# Patient Record
Sex: Male | Born: 1940 | Race: White | Hispanic: No | Marital: Married | Smoking: Never smoker
Health system: Southern US, Community
[De-identification: ages and names within clinical notes are randomized; demographics above are authoritative.]

## PROBLEM LIST (undated history)

## (undated) DIAGNOSIS — Z9289 Personal history of other medical treatment: Secondary | ICD-10-CM

## (undated) DIAGNOSIS — K219 Gastro-esophageal reflux disease without esophagitis: Secondary | ICD-10-CM

## (undated) DIAGNOSIS — T8859XA Other complications of anesthesia, initial encounter: Secondary | ICD-10-CM

## (undated) DIAGNOSIS — N189 Chronic kidney disease, unspecified: Secondary | ICD-10-CM

## (undated) DIAGNOSIS — I679 Cerebrovascular disease, unspecified: Secondary | ICD-10-CM

## (undated) DIAGNOSIS — R112 Nausea with vomiting, unspecified: Secondary | ICD-10-CM

## (undated) DIAGNOSIS — I480 Paroxysmal atrial fibrillation: Secondary | ICD-10-CM

## (undated) DIAGNOSIS — E7800 Pure hypercholesterolemia, unspecified: Secondary | ICD-10-CM

## (undated) DIAGNOSIS — I10 Essential (primary) hypertension: Secondary | ICD-10-CM

## (undated) DIAGNOSIS — Q383 Other congenital malformations of tongue: Secondary | ICD-10-CM

## (undated) DIAGNOSIS — Z9889 Other specified postprocedural states: Secondary | ICD-10-CM

## (undated) DIAGNOSIS — D049 Carcinoma in situ of skin, unspecified: Secondary | ICD-10-CM

## (undated) DIAGNOSIS — D649 Anemia, unspecified: Secondary | ICD-10-CM

## (undated) DIAGNOSIS — I739 Peripheral vascular disease, unspecified: Secondary | ICD-10-CM

## (undated) DIAGNOSIS — I2510 Atherosclerotic heart disease of native coronary artery without angina pectoris: Secondary | ICD-10-CM

## (undated) DIAGNOSIS — H8110 Benign paroxysmal vertigo, unspecified ear: Secondary | ICD-10-CM

## (undated) DIAGNOSIS — R918 Other nonspecific abnormal finding of lung field: Secondary | ICD-10-CM

## (undated) DIAGNOSIS — T4145XA Adverse effect of unspecified anesthetic, initial encounter: Secondary | ICD-10-CM

## (undated) DIAGNOSIS — A6000 Herpesviral infection of urogenital system, unspecified: Secondary | ICD-10-CM

## (undated) DIAGNOSIS — C44612 Basal cell carcinoma of skin of right upper limb, including shoulder: Secondary | ICD-10-CM

## (undated) DIAGNOSIS — Z5189 Encounter for other specified aftercare: Secondary | ICD-10-CM

## (undated) HISTORY — DX: Essential (primary) hypertension: I10

## (undated) HISTORY — DX: Basal cell carcinoma of skin of right upper limb, including shoulder: C44.612

## (undated) HISTORY — DX: Gastro-esophageal reflux disease without esophagitis: K21.9

## (undated) HISTORY — DX: Carcinoma in situ of skin, unspecified: D04.9

## (undated) HISTORY — DX: Paroxysmal atrial fibrillation: I48.0

## (undated) HISTORY — DX: Other congenital malformations of tongue: Q38.3

## (undated) HISTORY — DX: Personal history of other medical treatment: Z92.89

## (undated) HISTORY — DX: Encounter for other specified aftercare: Z51.89

## (undated) HISTORY — DX: Anemia, unspecified: D64.9

## (undated) HISTORY — DX: Herpesviral infection of urogenital system, unspecified: A60.00

## (undated) HISTORY — DX: Chronic kidney disease, unspecified: N18.9

## (undated) HISTORY — DX: Peripheral vascular disease, unspecified: I73.9

## (undated) HISTORY — DX: Other nonspecific abnormal finding of lung field: R91.8

## (undated) HISTORY — DX: Pure hypercholesterolemia, unspecified: E78.00

## (undated) HISTORY — DX: Cerebrovascular disease, unspecified: I67.9

## (undated) HISTORY — DX: Atherosclerotic heart disease of native coronary artery without angina pectoris: I25.10

## (undated) HISTORY — DX: Benign paroxysmal vertigo, unspecified ear: H81.10

---

## 2005-01-12 DIAGNOSIS — Z9289 Personal history of other medical treatment: Secondary | ICD-10-CM

## 2005-01-12 HISTORY — DX: Personal history of other medical treatment: Z92.89

## 2005-05-09 ENCOUNTER — Inpatient Hospital Stay (HOSPITAL_COMMUNITY): Admission: EM | Admit: 2005-05-09 | Discharge: 2005-05-18 | Payer: Self-pay | Admitting: Emergency Medicine

## 2005-05-11 ENCOUNTER — Encounter (INDEPENDENT_AMBULATORY_CARE_PROVIDER_SITE_OTHER): Payer: Self-pay | Admitting: *Deleted

## 2005-05-12 HISTORY — PX: CORONARY ARTERY BYPASS GRAFT: SHX141

## 2005-05-20 ENCOUNTER — Inpatient Hospital Stay (HOSPITAL_COMMUNITY): Admission: EM | Admit: 2005-05-20 | Discharge: 2005-05-24 | Payer: Self-pay | Admitting: Emergency Medicine

## 2005-05-21 ENCOUNTER — Encounter (INDEPENDENT_AMBULATORY_CARE_PROVIDER_SITE_OTHER): Payer: Self-pay | Admitting: Cardiology

## 2005-06-11 ENCOUNTER — Encounter: Admission: RE | Admit: 2005-06-11 | Discharge: 2005-06-11 | Payer: Self-pay | Admitting: Cardiothoracic Surgery

## 2005-06-14 HISTORY — PX: CAROTID ENDARTERECTOMY: SUR193

## 2005-06-24 ENCOUNTER — Encounter (HOSPITAL_COMMUNITY): Admission: RE | Admit: 2005-06-24 | Discharge: 2005-09-22 | Payer: Self-pay | Admitting: *Deleted

## 2005-09-18 ENCOUNTER — Emergency Department (HOSPITAL_COMMUNITY): Admission: EM | Admit: 2005-09-18 | Discharge: 2005-09-18 | Payer: Self-pay | Admitting: Emergency Medicine

## 2005-12-20 ENCOUNTER — Ambulatory Visit (HOSPITAL_COMMUNITY): Admission: RE | Admit: 2005-12-20 | Discharge: 2005-12-21 | Payer: Self-pay | Admitting: *Deleted

## 2006-01-10 ENCOUNTER — Inpatient Hospital Stay (HOSPITAL_COMMUNITY): Admission: RE | Admit: 2006-01-10 | Discharge: 2006-01-13 | Payer: Self-pay | Admitting: Vascular Surgery

## 2006-01-11 ENCOUNTER — Encounter (INDEPENDENT_AMBULATORY_CARE_PROVIDER_SITE_OTHER): Payer: Self-pay | Admitting: Specialist

## 2006-01-24 ENCOUNTER — Emergency Department (HOSPITAL_COMMUNITY): Admission: EM | Admit: 2006-01-24 | Discharge: 2006-01-24 | Payer: Self-pay | Admitting: Emergency Medicine

## 2006-04-19 ENCOUNTER — Encounter: Admission: RE | Admit: 2006-04-19 | Discharge: 2006-04-19 | Payer: Self-pay | Admitting: Otolaryngology

## 2006-08-10 ENCOUNTER — Ambulatory Visit: Payer: Self-pay | Admitting: Vascular Surgery

## 2007-01-31 ENCOUNTER — Ambulatory Visit: Payer: Self-pay | Admitting: Vascular Surgery

## 2007-11-13 ENCOUNTER — Ambulatory Visit (HOSPITAL_COMMUNITY): Admission: RE | Admit: 2007-11-13 | Discharge: 2007-11-14 | Payer: Self-pay | Admitting: Nephrology

## 2007-11-13 ENCOUNTER — Encounter (INDEPENDENT_AMBULATORY_CARE_PROVIDER_SITE_OTHER): Payer: Self-pay | Admitting: Nephrology

## 2007-11-28 ENCOUNTER — Ambulatory Visit: Payer: Self-pay | Admitting: Vascular Surgery

## 2008-03-02 ENCOUNTER — Inpatient Hospital Stay (HOSPITAL_COMMUNITY): Admission: EM | Admit: 2008-03-02 | Discharge: 2008-03-08 | Payer: Self-pay | Admitting: Emergency Medicine

## 2008-03-02 ENCOUNTER — Ambulatory Visit: Payer: Self-pay | Admitting: Internal Medicine

## 2008-03-02 ENCOUNTER — Encounter: Payer: Self-pay | Admitting: Pulmonary Disease

## 2008-03-04 ENCOUNTER — Ambulatory Visit: Payer: Self-pay | Admitting: Internal Medicine

## 2008-05-24 ENCOUNTER — Ambulatory Visit: Payer: Self-pay | Admitting: Family Medicine

## 2009-07-30 IMAGING — CR DG CHEST 1V PORT
1 series · 1 of 1 positions shown · non-contrast
Comparison: Portable exam 8578 hours compared to 03/02/2008

CLINICAL DATA: Shortness of breath, fever, atelectasis

PORTABLE CHEST - 1 VIEW

[view not recorded]
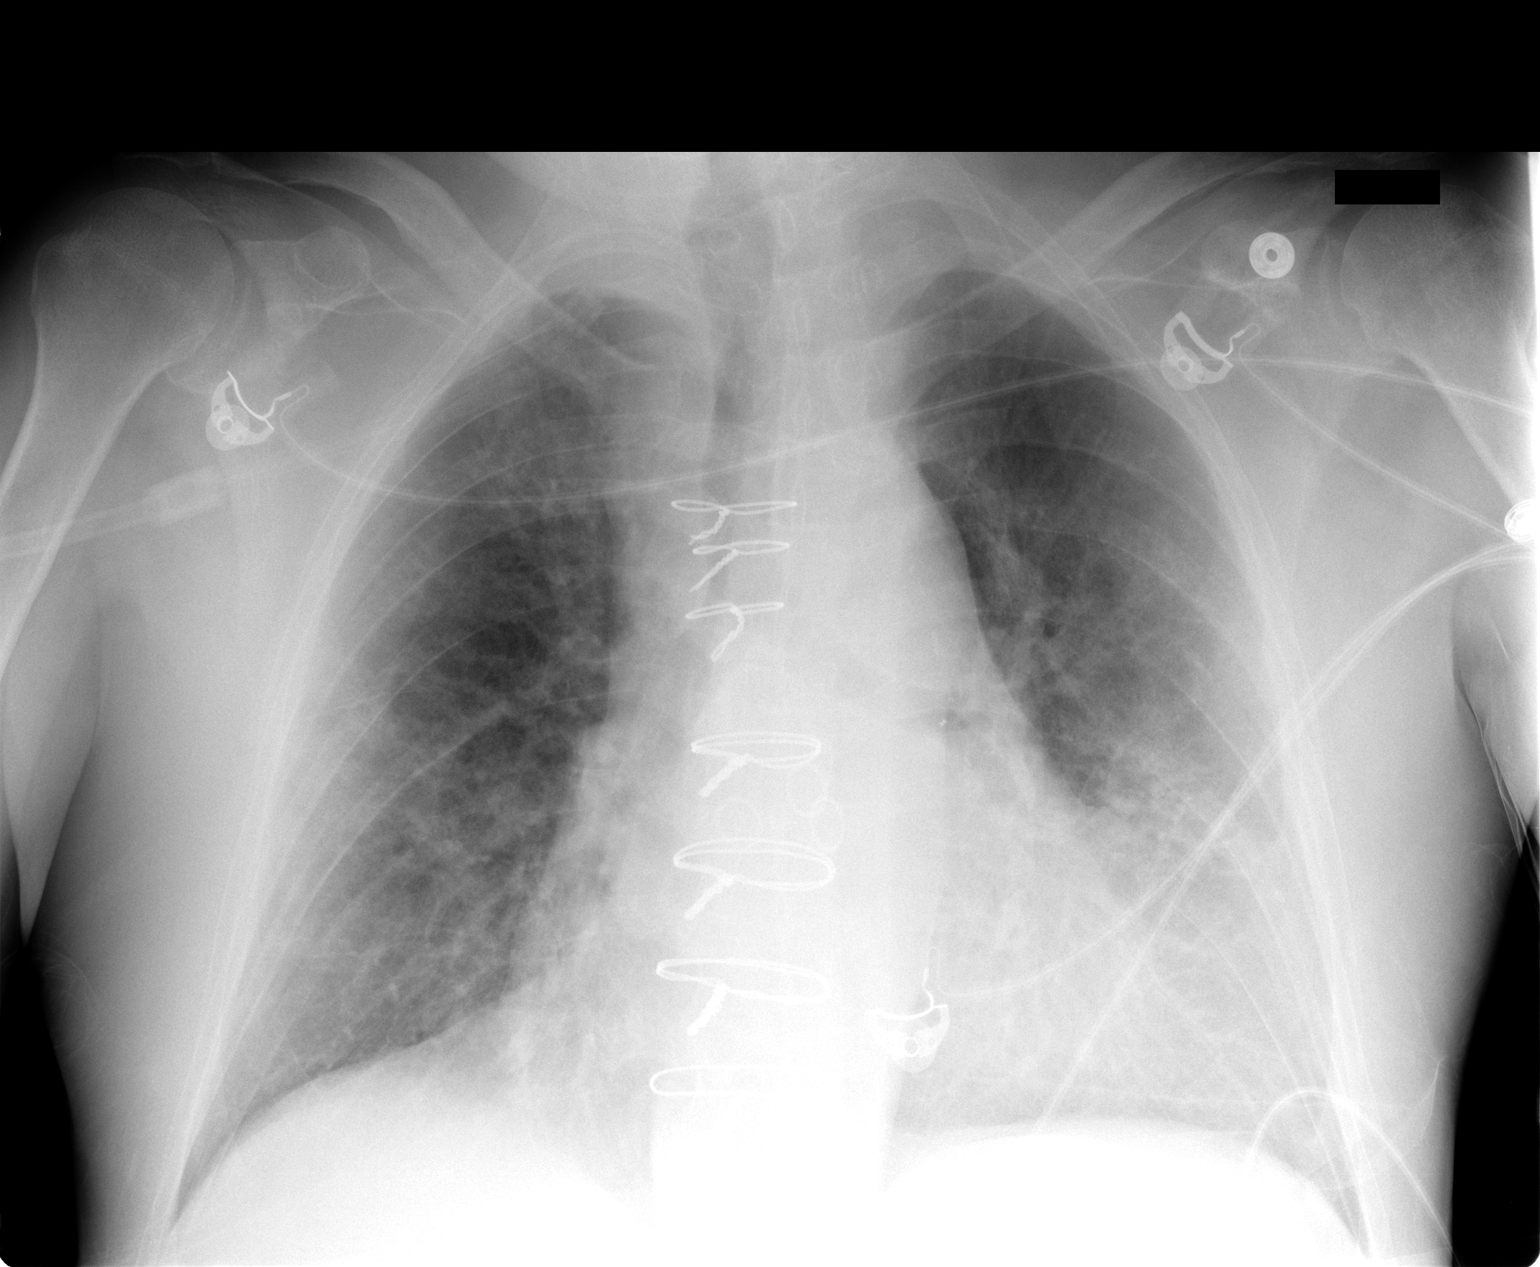

[1 of 1 positions shown; findings below may reference images not displayed]

FINDINGS: Cardiac enlargement status post CABG.
Slight pulmonary vascular congestion.
Minimal bibasilar atelectasis, slightly greater on right.
Question subtle developing mid lung infiltrates bilaterally.
No pleural effusion or pneumothorax.
IMPRESSION: Cardiomegaly status post CABG.
Minimal bibasilar atelectasis with questionable developing mid lung
infiltrates.

## 2009-08-01 IMAGING — CR DG CHEST 2V
2 series · 2 of 2 positions shown · non-contrast
Comparison: Portable chest 03/04/2008, 03/02/2008 and 09/18/2005.

CLINICAL DATA: Shortness of breath.  History of pneumonia.

CHEST - 2 VIEW

[w chest pa]
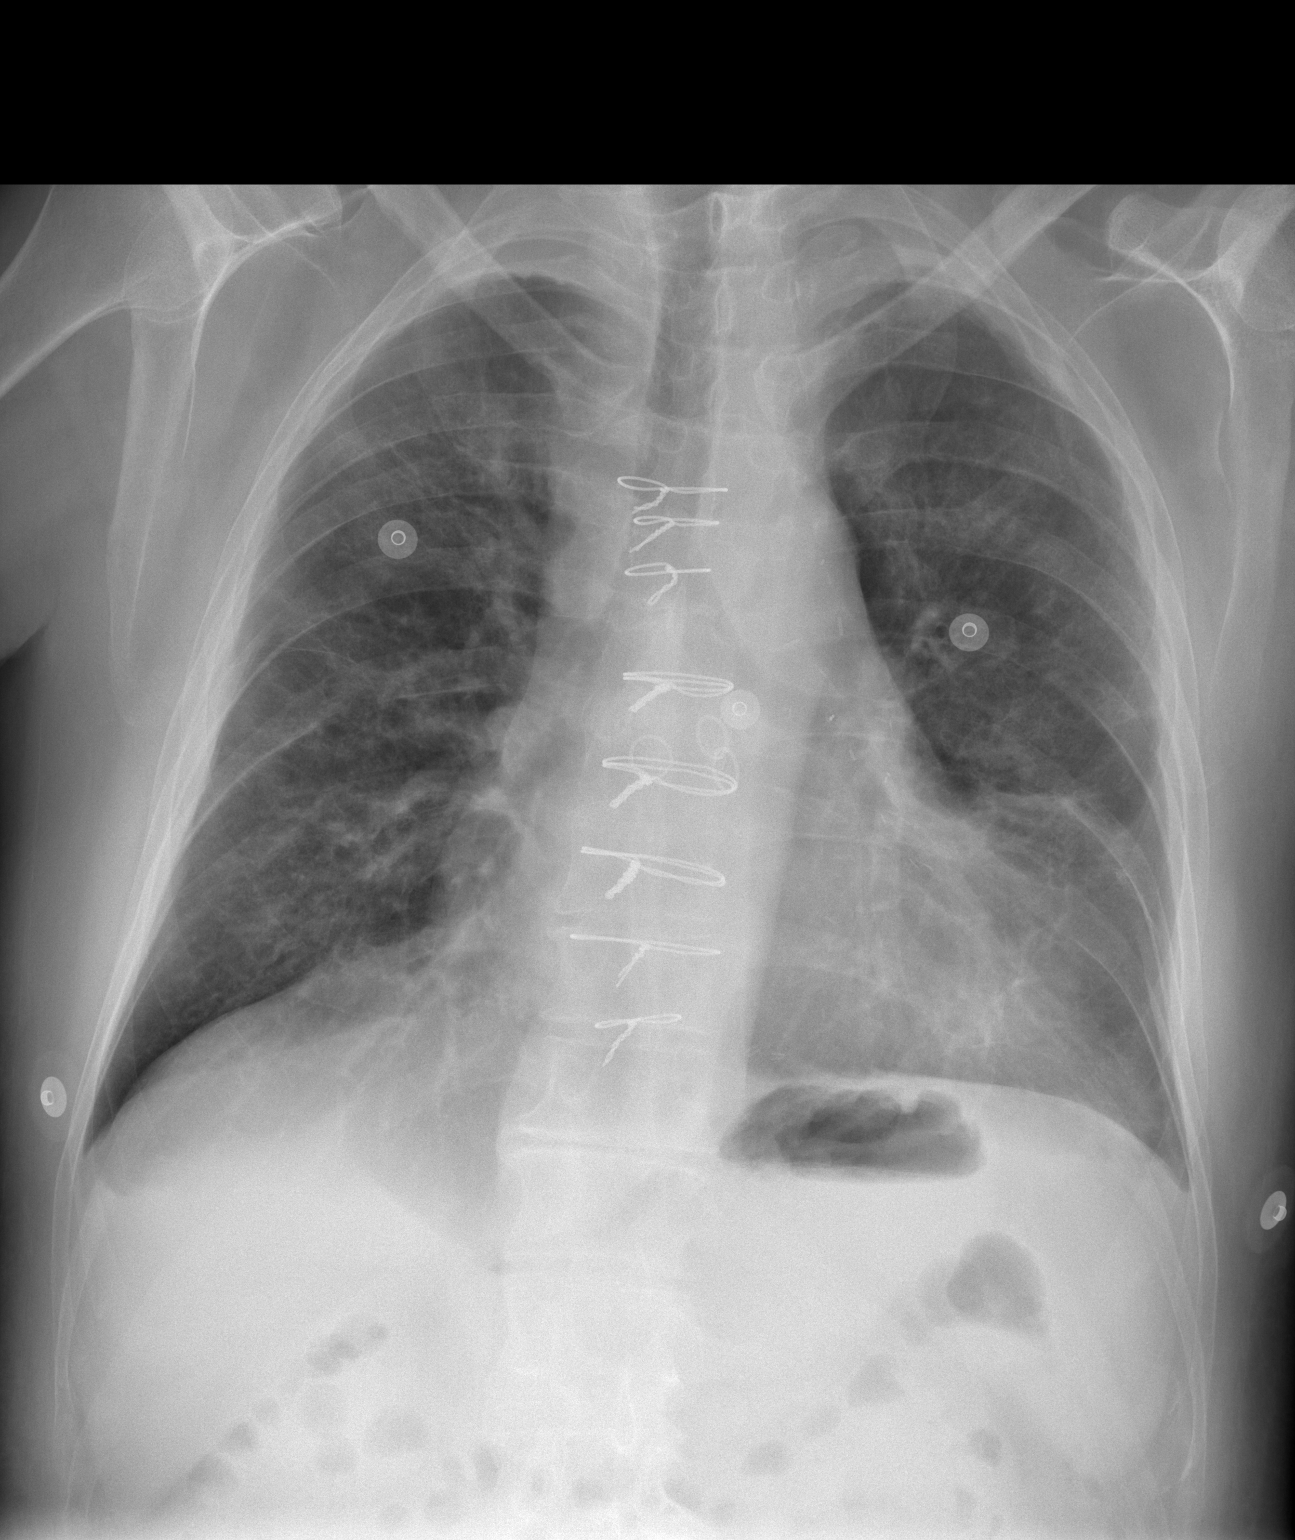

[w chest lat]
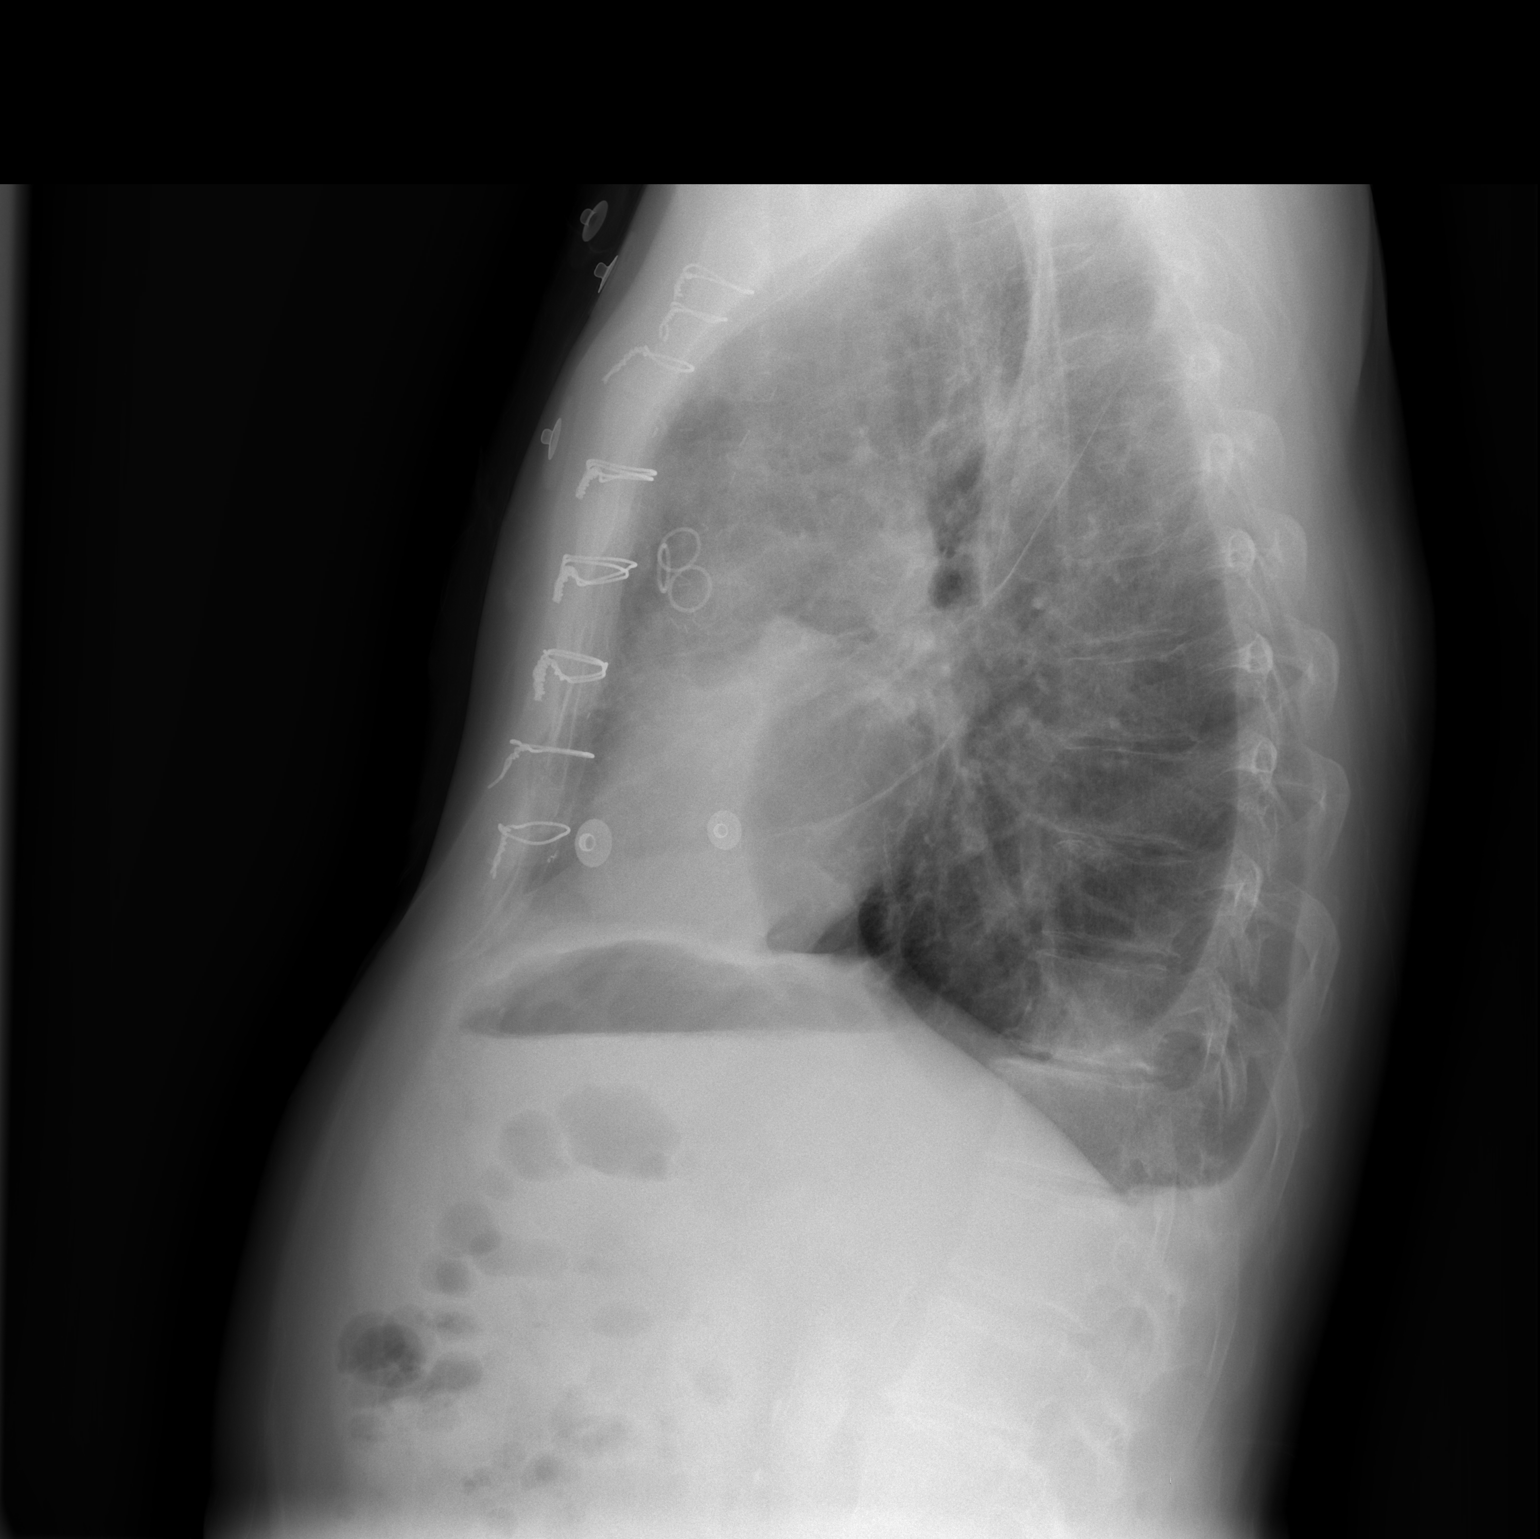

[2 of 2 positions shown; findings below may reference images not displayed]

FINDINGS: The patient has small bilateral pleural effusions, larger
on the left.  Bibasilar airspace disease, left greater right shows
continued improvement.  There is cardiomegaly.  The patient there
is status post CABG.
IMPRESSION: Continued improvement in bibasilar aeration with small bilateral
pleural effusions noted.

## 2009-09-16 ENCOUNTER — Encounter: Admission: RE | Admit: 2009-09-16 | Discharge: 2009-09-16 | Payer: Self-pay | Admitting: Nephrology

## 2009-11-05 ENCOUNTER — Encounter: Payer: Self-pay | Admitting: Pulmonary Disease

## 2009-11-05 ENCOUNTER — Encounter: Admission: RE | Admit: 2009-11-05 | Discharge: 2009-11-05 | Payer: Self-pay | Admitting: Emergency Medicine

## 2009-11-24 ENCOUNTER — Ambulatory Visit: Payer: Self-pay | Admitting: Pulmonary Disease

## 2009-11-24 DIAGNOSIS — E785 Hyperlipidemia, unspecified: Secondary | ICD-10-CM

## 2009-11-24 DIAGNOSIS — N289 Disorder of kidney and ureter, unspecified: Secondary | ICD-10-CM

## 2009-11-24 DIAGNOSIS — R930 Abnormal findings on diagnostic imaging of skull and head, not elsewhere classified: Secondary | ICD-10-CM

## 2009-12-08 ENCOUNTER — Ambulatory Visit (HOSPITAL_COMMUNITY): Admission: RE | Admit: 2009-12-08 | Discharge: 2009-12-08 | Payer: Self-pay | Admitting: Pulmonary Disease

## 2009-12-10 ENCOUNTER — Encounter: Payer: Self-pay | Admitting: Pulmonary Disease

## 2009-12-18 ENCOUNTER — Ambulatory Visit (HOSPITAL_COMMUNITY): Admission: RE | Admit: 2009-12-18 | Discharge: 2009-12-18 | Payer: Self-pay | Admitting: Pulmonary Disease

## 2009-12-18 ENCOUNTER — Encounter: Payer: Self-pay | Admitting: Pulmonary Disease

## 2009-12-26 ENCOUNTER — Telehealth: Payer: Self-pay | Admitting: Pulmonary Disease

## 2010-04-28 ENCOUNTER — Ambulatory Visit: Payer: Self-pay | Admitting: Cardiovascular Disease

## 2010-06-14 DIAGNOSIS — Z9289 Personal history of other medical treatment: Secondary | ICD-10-CM

## 2010-06-14 HISTORY — PX: CARDIAC CATHETERIZATION: SHX172

## 2010-06-14 HISTORY — DX: Personal history of other medical treatment: Z92.89

## 2010-07-10 ENCOUNTER — Inpatient Hospital Stay (HOSPITAL_COMMUNITY)
Admission: EM | Admit: 2010-07-10 | Discharge: 2010-07-13 | Disposition: A | Discharge status: Other Acute Inpatient Hospital | Payer: Self-pay | Source: Home / Self Care

## 2010-07-10 LAB — DIFFERENTIAL
Basophils Relative: 1 % (ref 0–1)
Eosinophils Absolute: 0.1 10*3/uL (ref 0.0–0.7)
Lymphocytes Relative: 16 % (ref 12–46)
Lymphs Abs: 0.9 10*3/uL (ref 0.7–4.0)
Monocytes Absolute: 0.5 10*3/uL (ref 0.1–1.0)
Monocytes Relative: 9 % (ref 3–12)
Neutro Abs: 4.4 10*3/uL (ref 1.7–7.7)
Neutrophils Relative %: 74 % (ref 43–77)

## 2010-07-10 LAB — CBC
HCT: 41 % (ref 39.0–52.0)
Hemoglobin: 14.2 g/dL (ref 13.0–17.0)
MCH: 29.4 pg (ref 26.0–34.0)
MCV: 84.9 fL (ref 78.0–100.0)
Platelets: 180 10*3/uL (ref 150–400)
RBC: 4.83 MIL/uL (ref 4.22–5.81)
RDW: 14.1 % (ref 11.5–15.5)
WBC: 5.9 10*3/uL (ref 4.0–10.5)

## 2010-07-10 LAB — COMPREHENSIVE METABOLIC PANEL
ALT: 19 U/L (ref 0–53)
AST: 29 U/L (ref 0–37)
Albumin: 4.2 g/dL (ref 3.5–5.2)
BUN: 16 mg/dL (ref 6–23)
CO2: 26 mEq/L (ref 19–32)
Calcium: 9.2 mg/dL (ref 8.4–10.5)
Creatinine, Ser: 1.69 mg/dL — ABNORMAL HIGH (ref 0.4–1.5)
GFR calc Af Amer: 49 mL/min — ABNORMAL LOW (ref >60)
GFR calc non Af Amer: 41 mL/min — ABNORMAL LOW (ref >60)
Glucose, Bld: 116 mg/dL — ABNORMAL HIGH (ref 70–99)
Potassium: 3.8 mEq/L (ref 3.5–5.1)
Sodium: 136 mEq/L (ref 135–145)
Total Bilirubin: 0.5 mg/dL (ref 0.3–1.2)
Total Protein: 6.9 g/dL (ref 6.0–8.3)

## 2010-07-10 LAB — POCT CARDIAC MARKERS
CKMB, poc: <1 ng/mL — ABNORMAL LOW (ref 1.0–8.0)
Myoglobin, poc: 100 ng/mL (ref 12–200)
Troponin i, poc: <0.05 ng/mL (ref 0.00–0.09)

## 2010-07-10 LAB — LIPASE, BLOOD: Lipase: 27 U/L (ref 11–59)

## 2010-07-10 LAB — URINALYSIS, ROUTINE W REFLEX MICROSCOPIC
Bilirubin Urine: NEGATIVE
Specific Gravity, Urine: 1.016 (ref 1.005–1.030)
Urine Glucose, Fasting: NEGATIVE mg/dL
Urobilinogen, UA: 0.2 mg/dL (ref 0.0–1.0)
pH: 6 (ref 5.0–8.0)

## 2010-07-10 LAB — URINE MICROSCOPIC-ADD ON

## 2010-07-11 ENCOUNTER — Ambulatory Visit (HOSPITAL_COMMUNITY): Admission: RE | Admit: 2010-07-11 | Discharge: 2010-07-11 | Payer: Self-pay | Source: Home / Self Care

## 2010-07-12 LAB — BASIC METABOLIC PANEL
CO2: 25 mEq/L (ref 19–32)
Chloride: 111 mEq/L (ref 96–112)
Creatinine, Ser: 1.37 mg/dL (ref 0.4–1.5)
GFR calc Af Amer: >60 mL/min (ref >60)
Potassium: 4.3 mEq/L (ref 3.5–5.1)

## 2010-07-13 ENCOUNTER — Inpatient Hospital Stay (HOSPITAL_COMMUNITY)
Admission: AD | Admit: 2010-07-13 | Discharge: 2010-07-17 | DRG: 419 | Disposition: A | Discharge status: Home / Self Care | Payer: Medicare Other | Source: Other Acute Inpatient Hospital | Attending: Surgery | Admitting: Surgery

## 2010-07-13 ENCOUNTER — Ambulatory Visit (HOSPITAL_COMMUNITY)
Admission: RE | Admit: 2010-07-13 | Discharge: 2010-07-13 | Disposition: A | Discharge status: Other Acute Inpatient Hospital | Payer: Self-pay | Source: Home / Self Care | Attending: Cardiovascular Disease | Admitting: Cardiovascular Disease

## 2010-07-13 DIAGNOSIS — F411 Generalized anxiety disorder: Secondary | ICD-10-CM | POA: Diagnosis present

## 2010-07-13 DIAGNOSIS — I2510 Atherosclerotic heart disease of native coronary artery without angina pectoris: Secondary | ICD-10-CM | POA: Diagnosis present

## 2010-07-13 DIAGNOSIS — Z951 Presence of aortocoronary bypass graft: Secondary | ICD-10-CM

## 2010-07-13 DIAGNOSIS — K8010 Calculus of gallbladder with chronic cholecystitis without obstruction: Principal | ICD-10-CM | POA: Diagnosis present

## 2010-07-13 LAB — CBC
HCT: 42.3 % (ref 39.0–52.0)
Hemoglobin: 14.2 g/dL (ref 13.0–17.0)
MCH: 28.6 pg (ref 26.0–34.0)
MCV: 85.3 fL (ref 78.0–100.0)
RBC: 4.96 MIL/uL (ref 4.22–5.81)

## 2010-07-13 LAB — BASIC METABOLIC PANEL
CO2: 24 mEq/L (ref 19–32)
Chloride: 113 mEq/L — ABNORMAL HIGH (ref 96–112)
GFR calc Af Amer: >60 mL/min (ref >60)
Glucose, Bld: 88 mg/dL (ref 70–99)
Potassium: 4.5 mEq/L (ref 3.5–5.1)
Sodium: 144 mEq/L (ref 135–145)

## 2010-07-13 NOTE — H&P (Signed)
NAMESWAIN, ACREE NO.:  [id number]  MEDICAL RECORD NO.:  [id number]          PATIENT TYPE:  EMS  LOCATION:  ED                           FACILITY:  [facility]  PHYSICIAN:  Juanetta Gosling, MDDATE OF BIRTH:  Jun 21, 1940  DATE OF ADMISSION:  07/10/2010 DATE OF DISCHARGE:                             HISTORY & PHYSICAL   PRIMARY CARE PHYSICIAN:  Nilda Simmer, M.D.  CARDIOLOGIST:  Nanetta Batty, M.D.  NEPHROLOGIST:  Terrial Rhodes, M.D.  CHIEF COMPLAINT:  Abdominal pain.  HISTORY OF PRESENT ILLNESS:  Mr. Passey is a 70 year old white male with history of coronary artery disease, hypercholesterolemia and glomerulonephritis who has had several episodes of right upper quadrant pain over the last several weeks.  Currently he is having his third episode.  He states that these episodes started around 12:30 or 1:30 at night.  He did develop pain in the right upper quadrant and developed some slight nausea.  However, typically these episodes get better within an hour.  Last night around 12:30 he developed pain in the right upper quadrant with some slight nausea.  His pain began to wax and wane and did not immediately go away.  Because of this, he presented to the Emergency Department for further evaluation.  Upon arrival, he was found to have completely normal laboratories.  However, he did have an ultrasound which revealed gallstones with gallbladder sludge and the gallbladder wall slightly thickened at 4 mm.  Because of these findings, we were asked to evaluate the patient for surgical admission.  REVIEW OF SYSTEMS:  Please see HPI.  Otherwise all other systems have been reviewed and are negative.  The patient denies any chest pain or shortness of breath.  FAMILY HISTORY:  Noncontributory.  PAST MEDICAL HISTORY: 1. Coronary artery disease. 2. Glomerulonephritis 3. Hypercholesterolemia. 4. History of MRSA.  PAST SURGICAL HISTORY: 1. Coronary artery  bypass grafting x4 in 2006. 2. Right carotid endarterectomy in 2007.  SOCIAL HISTORY:  The patient denies any tobacco.  He may have a glass of wine once every 3 months.  He has no kids and is married and is otherwise retired.  ALLERGIES:  ADHESIVE TAPE.  MEDICATIONS: 1. Metoprolol tartrate 25 mg daily. 2. Furosemide 10 mg daily. 3. Simvastatin 80 mg daily. 4. Aspirin 81 mg daily. 5. Vitamin B12 100 mcg daily. 6. Nexium 40 mg daily. 7. Triplex daily.  PHYSICAL EXAMINATION:  GENERAL:  Mr. Worlds is a very pleasant 70 year old white male who is currently lying in bed, well-developed and well- nourished, in no acute distress. VITAL SIGNS:  Temperature 97.7, blood pressure 135/57 pulses 52, respirations 16. HEENT: Head is normocephalic, atraumatic.  Sclerae noninjected.  Pupils are equal, round and reactive to light.  Ears and nose without any obvious masses or lesions.  No rhinorrhea.  Mouth is pink.  Throat shows no exudate. HEART:  Regular rate and rhythm.  Normal S1, S2.  No murmurs, gallops or rubs are noted.  He does have palpable carotid, radial and pedal pulses bilaterally. LUNGS:  Clear to auscultation bilaterally with no wheezes, rhonchi or rales noted.  Respiratory effort is nonlabored. ABDOMEN:  Soft.  Minimal epigastric tenderness.  Otherwise nontender. He is nondistended with active bowel sounds.  No masses, hernias or organomegaly are noted. MUSCULOSKELETAL:  All four extremities are symmetrical with no cyanosis, clubbing or edema. SKIN:  Warm and dry with no obvious masses, lesions or rashes. PSYCHIATRIC:  The patient is alert and oriented x3 with an appropriate affect.  LABORATORY DATA:  White blood cell count is 5900, hemoglobin 14.2, hematocrit 41.0, platelet count is 180,000.  Sodium 136, potassium 3.8, glucose 116, BUN 16, creatinine 1.69, total bilirubin 0.5, alkaline phosphatase 59, AST 29, ALT 19.  DIAGNOSTICS:  Chest x-ray reveals stable x-ray with  no active acute disease.  Ultrasound of the gallbladder reveals gallstones with sludge along with gallbladder wall thickening up to 4 mm.  IMPRESSION: 1. Biliary colic, probable chronic cholecystitis. 2. Coronary artery disease. 3. Glomerulonephritis.  PLAN:  At this time we will get the patient admitted.  We will ask the cardiologist to evaluate the patient prior to surgical intervention for preoperative clearance.  If this is able to be completed today, then we will plan on surgical intervention later today.  Therefore, we will keep the patient n.p.o. and on IV fluids as well as p.r.n. medications as needed for pain and nausea.  The patient has also asked me to speak with Dr. Arrie Aran as well prior to surgical intervention.  I have placed a call into his office and he is supposed to return my call a little bit later.     Letha Cape, PA   ______________________________ Juanetta Gosling, MD    KEO/MEDQ  D:  07/10/2010  T:  07/10/2010  Job:  [id number]  cc:   Nilda Simmer, M.D. Fax: [phone]  Nanetta Batty, M.D. Fax: [phone]  Terrial Rhodes, M.D. Fax: [phone]  Electronically Signed by Barnetta Chapel PA on 07/13/2010 01:33:51 PM Electronically Signed by Emelia Loron MD on 07/13/2010 04:27:05 PM

## 2010-07-14 LAB — CBC
Hemoglobin: 13.6 g/dL (ref 13.0–17.0)
MCH: 29.1 pg (ref 26.0–34.0)
MCV: 84.2 fL (ref 78.0–100.0)
RBC: 4.68 MIL/uL (ref 4.22–5.81)
WBC: 4.3 10*3/uL (ref 4.0–10.5)

## 2010-07-14 LAB — BASIC METABOLIC PANEL
BUN: 9 mg/dL (ref 6–23)
CO2: 25 mEq/L (ref 19–32)
Chloride: 108 mEq/L (ref 96–112)
Creatinine, Ser: 1.41 mg/dL (ref 0.4–1.5)
Potassium: 3.8 mEq/L (ref 3.5–5.1)

## 2010-07-14 LAB — HEPATIC FUNCTION PANEL
ALT: 17 U/L (ref 0–53)
Albumin: 3.1 g/dL — ABNORMAL LOW (ref 3.5–5.2)
Alkaline Phosphatase: 57 U/L (ref 39–117)
Indirect Bilirubin: 0.8 mg/dL (ref 0.3–0.9)
Total Bilirubin: 0.9 mg/dL (ref 0.3–1.2)
Total Protein: 5.6 g/dL — ABNORMAL LOW (ref 6.0–8.3)

## 2010-07-14 LAB — SURGICAL PCR SCREEN: Staphylococcus aureus: NEGATIVE

## 2010-07-14 NOTE — Progress Notes (Signed)
Summary: FNA with atypical cells, never smoker, ? next step  Phone Note Call from Patient Call back at Home Phone [phone]   Caller: Patient Call For: Cori Justus Reason for Call: Talk to Nurse, Lab or Test Results Summary of Call: pt waiting for Biopsy results from last week.  Done July 7. Initial call taken by: Eugene Gavia,  December 26, 2009 9:39 AM  Follow-up for Phone Call        called and spoke with pt.  pt states KC ordered a CT guided biopsy on 12-18-2009.  Pt calling for results.  Informed pt KC out of office until 12-30-2009.  Pt states he is "very anxious" and doesn't want to go into the weekend without knowing results.  Printed off cytology results in EMR and will see if the doc of the day will read the results.  MW, please advise.  Thanks.  Aundra Millet Reynolds LPN  December 26, 2009 9:56 AM  discussed with pt, non diagnostic, will defer Dr Shelle Iron Follow-up by: Nyoka Cowden MD,  December 26, 2009 12:12 PM  Additional Follow-up for Phone Call Additional follow up Details #1::        called and discussed results with pt.  2options are surgical resection vs surveillance at 4mos.  I have discussed the risks and benefits with pt re: both choices.  He understands I cannot exclude the possibility of a cancer.  At this point, he would like to do surveillance.  Will call if something changes.  Will schedule ct in Nov of this year. Additional Follow-up by: Barbaraann Share MD,  December 31, 2009 8:05 PM

## 2010-07-14 NOTE — Miscellaneous (Signed)
Summary: Orders Update   Clinical Lists Changes  Orders: Added new Referral order of Radiology Referral (Radiology) - Signed 

## 2010-07-14 NOTE — Assessment & Plan Note (Signed)
Summary: consult for abnormal ct chest   Copy to:  Brett Canales Daub/ [place] Primary Provider/Referring Provider:  Octavio Graves  CC:  Pulmonary Consult.  History of Present Illness: The pt is a 70y/o male who I have been asked to see for an abnormal ct chest.  He recently had an episode of acute bronchitis while on a trip, and a cxr during this time showed ?pna.  He subsequently underwent ct chest which showed calcified mediastinal and hilar LN, a 1.6cm nodule in RML with adjacent 1.2 cm nodule both of which are new from scan of 2009, lingular bronchiectasis, and multiple nodules in LLL vs nodular asdz.  The scan also showed splenic calcifications.  The pt feels he is now back to his usual baseline with no pulmonary symptoms.  He is a never smoker, has no unexpected weight loss, and has good appetite.  He has no obvious history of TB exposure, but questions whether he may have had a positive ppd in the past.  He was raised in [place], lived in [place]. for a good bit, and [place]  He did have some type of biopsy on left lung abnormality in 1989 that was negative for cancer.  Preventive Screening-Counseling & Management  Alcohol-Tobacco     Smoking Status: never  Current Medications (verified): 1)  Metoprolol Tartrate 25 Mg Tabs (Metoprolol Tartrate) .... Take 1 Tablet By Mouth Once A Day 2)  Furosemide 20 Mg Tabs (Furosemide) .... Take 1 Tablet By Mouth Once A Day 3)  Simvastatin 80 Mg Tabs (Simvastatin) .... Take 1 Tablet By Mouth Once A Day 4)  Aspirin Low Dose 81 Mg Tabs (Aspirin) .... Take 1 Tablet By Mouth Once A Day 5)  Vitamin B-12 1000 Mcg Tabs (Cyanocobalamin) .... Take 1 Tab By Mouth Every Other Day 6)  Nexium 40 Mg Cpdr (Esomeprazole Magnesium) .... Take 1 Tab By Mouth Every Other Day 7)  Trilipix 45 Mg Cpdr (Choline Fenofibrate) .... Take 1 Tablet By Mouth Once A Day  Allergies (verified): 1)  ! * Latex  Past History:  Past Medical  History: CAD chronic renal insufficiency HYPERLIPIDEMIA (ICD-272.4) CVD    Past Surgical History: heart bypass Nov 2006 Fistula 1980s biopsy of L lung 1989 carotid artery July 2007  Family History: Reviewed history and no changes required. mother: DM  Social History: Reviewed history and no changes required. Patient never smoked.  pt is married to [name] Pt is a retired Product/process development scientist.  Smoking Status:  never  Review of Systems       The patient complains of tooth/dental problems.  The patient denies shortness of breath with activity, shortness of breath at rest, productive cough, non-productive cough, coughing up blood, chest pain, irregular heartbeats, acid heartburn, indigestion, loss of appetite, weight change, abdominal pain, difficulty swallowing, sore throat, headaches, nasal congestion/difficulty breathing through nose, sneezing, itching, ear ache, anxiety, depression, hand/feet swelling, joint stiffness or pain, rash, change in color of mucus, and fever.    Vital Signs:  Patient profile:   70 year old male Height:      69 inches Weight:      151 pounds BMI:     22.38 O2 Sat:      99 % on Room air Temp:     97.3 degrees F oral Pulse rate:   61 / minute BP sitting:   116 / 70  (left arm) Cuff size:   regular  Vitals Entered By: Arman Filter LPN (November 24, 2009 11:00  AM)  O2 Flow:  Room air CC: Pulmonary Consult Comments Medications reviewed with patient Arman Filter LPN  November 24, 2009 11:01 AM    Physical Exam  General:  wd male in nad Eyes:  PERRLA and EOMI.   Nose:  mild septal deviation to left Mouth:  clear Neck:  no jvd, tmg, LN Lungs:  clear to auscultation Heart:  rrr, no mrg Abdomen:  soft and nontender, bs+ Extremities:  no edema or cyanosis pulses intact distally. Neurologic:  alert and oriented, moves all 4.   Impression & Recommendations:  Problem # 1:  CT, CHEST, ABNORMAL (ICD-793.1) the pt has evidence for old granulomatous disease  on his chest ct in the past and currently, as well as a density in his lingula that is stable and apparently biopsied in 1989 by the pt's history.  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  He has new densities in RML that I suspect are inflammatory, but they are new from 2009 and are sizeable.  He has never smoked, and that at least is a factor in his favor.  At this point, I would recommend PET scan due to the size of the abnormalities and the fact they are new from the prior ct chest.  If "hot", will probably need TTNA.  If "cold", then would do surveillance ct in 6mos.  The pt is agreeable to this approach.  Medications Added to Medication List This Visit: 1)  Metoprolol Tartrate 25 Mg Tabs (Metoprolol tartrate) .... Take 1 tablet by mouth once a day 2)  Furosemide 20 Mg Tabs (Furosemide) .... Take 1 tablet by mouth once a day 3)  Simvastatin 80 Mg Tabs (Simvastatin) .... Take 1 tablet by mouth once a day 4)  Aspirin Low Dose 81 Mg Tabs (Aspirin) .... Take 1 tablet by mouth once a day 5)  Vitamin B-12 1000 Mcg Tabs (Cyanocobalamin) .... Take 1 tab by mouth every other day 6)  Nexium 40 Mg Cpdr (Esomeprazole magnesium) .... Take 1 tab by mouth every other day 7)  Trilipix 45 Mg Cpdr (Choline fenofibrate) .... Take 1 tablet by mouth once a day  Other Orders: Consultation Level IV (60454) Radiology Referral (Radiology)  Patient Instructions: 1)  will set up for PET scan.  Depending upon results, will decide about surveillance vs biopsy.

## 2010-07-15 HISTORY — PX: CHOLECYSTECTOMY: SHX55

## 2010-07-16 LAB — BASIC METABOLIC PANEL
BUN: 13 mg/dL (ref 6–23)
CO2: 20 mEq/L (ref 19–32)
Calcium: 8.7 mg/dL (ref 8.4–10.5)
Chloride: 105 mEq/L (ref 96–112)
Creatinine, Ser: 1.4 mg/dL (ref 0.4–1.5)
GFR calc Af Amer: >60 mL/min (ref >60)
GFR calc non Af Amer: 50 mL/min — ABNORMAL LOW (ref >60)
Glucose, Bld: 88 mg/dL (ref 70–99)
Potassium: 4.2 mEq/L (ref 3.5–5.1)
Sodium: 137 mEq/L (ref 135–145)

## 2010-07-17 NOTE — Op Note (Signed)
  NAMEANTOINNE, SPADACCINI            ACCOUNT NO.:  [id number]  MEDICAL RECORD NO.:  [id number]          PATIENT TYPE:  INP  LOCATION:  1443                         FACILITY:  [facility]  PHYSICIAN:  Wilmon Arms. Corliss Skains, M.D. DATE OF BIRTH:  June 07, 1941  DATE OF PROCEDURE:  07/15/2010 DATE OF DISCHARGE:                              OPERATIVE REPORT   PREOPERATIVE DIAGNOSIS:  Chronic cholecystitis.  PREOPERATIVE DIAGNOSIS:  Chronic cholecystitis.  PROCEDURE PERFORMED:  Laparoscopic cholecystectomy with intraoperative cholangiogram.  SURGEON:  Wilmon Arms. Kaimana Lurz, M.D.  ANESTHESIA:  General.  INDICATIONS:  This is a 70 year old male with a past medical history significant for coronary artery disease who presents with several weeks of upper abdominal pain.  Workup showed gallstones as well as some gallbladder sludge.  He had preoperative clearance including a cardiac catheterization.  He was felt to be cleared for surgery by Cardiology. He presents now for cholecystectomy.  DESCRIPTION OF PROCEDURE:  The patient was brought to the operating room, placed in the supine position on the operating room table.  After an adequate level of general anesthesia was obtained, the patient's abdomen was shaved, prepped with Betadine, and draped in sterile fashion.  Time-out was taken to assure proper patient and proper procedure.  We infiltrated the area below the umbilicus with 0.25% Marcaine with epinephrine.  A transverse incision was made.  Dissection was carried down through the fascia.  We entered the peritoneal cavity bluntly.  A stay sutures of 0 Vicryl was placed around the fascial opening.  The Hassan cannula was inserted and secured to stay suture. Pneumoperitoneum was then obtained by insufflating CO2 maintaining maximal pressure of 15 mmHg.  The laparoscope was inserted and the patient was positioned in reverse Trendelenburg tilted to his left.  An 11-mm port was placed in the subxiphoid  position.  Two 5-mm ports were placed in the right upper quadrant.  The gallbladder was grasped with clamp and lifted.  We encountered a lot of omental adhesions.  These were taken down with blunt dissection.  We exposed the hilum of gallbladder.  We bluntly dissect around the cystic duct and cystic artery.  The cystic duct was ligated with clips and divided.  The cystic artery was ligated with clips and divided.  The gallbladder was then dissected free from the liver.  The gallbladder was placed in EndoCatch sac and we inspected the gallbladder fossa.  A little bit of bile spilled.  The gallbladder fossa was cauterized for hemostasis.  We irrigated thoroughly.  The gallbladder in EndoCatch sac was then removed from umbilical port site.  The pursestring suture was used to close umbilical fascia.  The skin was reapproximated with 4- 0 Monocryl.  Steri-Strips and clean dressings were applied.  The patient was then extubated, brought to recovery room in stable condition.  All sponge, instrument, and needle counts were correct.     Wilmon Arms. Everado Pillsbury, M.D.MKT/MEDQ  D:  07/15/2010  T:  07/15/2010  Job:  [id number]  Electronically Signed by Manus Rudd M.D. on 07/17/2010 04:54:09 PM

## 2010-07-24 NOTE — Discharge Summary (Signed)
[name], KU            ACCOUNT NO.:  [id number]  MEDICAL RECORD NO.:  [id number]          PATIENT TYPE:  INP  LOCATION:  1443                         [facility]ACILITY:  [facility]  PHYSICIAN:  Wilmon Arms. Corliss Skains, M.D. DATE OF BIRTH:  1941/04/28  DATE OF ADMISSION:  07/10/2010 DATE OF DISCHARGE:  07/17/2010                              DISCHARGE SUMMARY   ADMITTING PHYSICIAN:  Juanetta Gosling, MD  DISCHARGING PHYSICIAN:  Wilmon Arms. Teah Votaw, M.D.  PRIMARY CARE PHYSICIAN:  Nilda Simmer, M.D.  CARDIOLOGIST:  Nanetta Batty, M.D.  NEPHROLOGIST:  Terrial Rhodes, M.D.  CONSULTANTS:  Nanetta Batty, M.D. with Cardiology.  PROCEDURES:  Laparoscopic cholecystectomy by Dr. Manus Rudd on his July 15, 2010.  REASON FOR ADMISSION:  Mr. [name] is a 70 year old male with history of coronary artery disease and glomerulonephritis who has had several episodes of right upper quadrant abdominal pain over the last 2 weeks. He woke up the night prior to admission with his third episode which he states was severe epigastric and chest pain.  He had some slight nausea but no emesis.  His pain began to wax and wane and did not immediately go away like his prior episodes.  He therefore came to the emergency department where he had evaluation showing normal labs and ultrasound that showed gallstones with gallbladder sludge and gallbladder wall slightly thickened at 4 mm.  Please see admitting history and physical for further details.  ADMITTING DIAGNOSES: 1. Biliary colic, probable chronic cholecystitis. 2. Coronary artery disease. 3. Glomerulonephritis.  HOSPITAL COURSE:  At this time, the patient was admitted.  He was placed on a clear liquid diet.  Cardiology was asked to evaluate the patient for preoperative clearance.  Dr. Arrie Aran was called, and he cleared the patient for surgical intervention over the phone.  For the first several days the patient was admitted, the patient  underwent several cardiology tests.  He eventually had a stress test and cardiac catheterization which revealed stable coronary arteries.  After this was noted, the patient was felt stable for surgical intervention. Therefore, on February 1, the patient was taken to the operating room where he underwent a laparoscopic cholecystectomy.  The patient tolerated this procedure well.  On postoperative day #1, the patient was quite sore.  His creatinine remained stable around 1.4.  At this time, he was tolerating liquids and his diet was advanced.  On postoperative day #2, the patient was feeling much better.  He was tolerating a regular diet.  He had minimal pain but was otherwise well controlled. His abdomen was soft, appropriately tender with active bowel sounds. His incisions were all clean, dry and intact except for the lateral most incision that had some ooze with a blood clot on it.  Otherwise, this had stopped bleeding and a new dressing was placed.  Otherwise, the patient was felt stable for discharge home.  DISCHARGE DIAGNOSES: 1. Chronic cholecystitis. 2. Status post laparoscopic cholecystectomy. 3. Coronary artery disease. 4. Glomerulonephritis.  DISCHARGE MEDICATIONS:  Please see medication reconciliation form.  DISCHARGE INSTRUCTIONS:  The patient may increase his activity slowly and walk up steps.  He may shower starting tomorrow; however,  he is not to take a bath for the next 2 weeks.  He is not to do any heavy lifting greater than 15 pounds for the next 2 weeks.  He is not to drive for the next 3-4 days.  He is to resume a low-sodium heart-healthy diet.  He may remove the Tegaderm and gauze starting tomorrow.  He is to return to see the [facility] on August 04, 2010 at 2:45 p.m..  Otherwise, he is to call for worsening pain or fever greater than 101.5.     Letha Cape, PA   ______________________________ Wilmon Arms. Corliss Skains,  M.D.    KEO/MEDQ  D:  07/17/2010  T:  07/17/2010  Job:  [id number]  cc:   Nilda Simmer, M.D. Fax: [phone]  Terrial Rhodes, M.D. Fax: [phone]  Nanetta Batty, M.D. Fax: [phone]  Electronically Signed by Barnetta Chapel PA on 07/22/2010 03:29:17 PM Electronically Signed by Manus Rudd M.D. on 07/24/2010 66:44:03 PM

## 2010-08-30 LAB — APTT: aPTT: 25 seconds (ref 24–37)

## 2010-08-30 LAB — CBC
HCT: 44.1 % (ref 39.0–52.0)
MCH: 30.4 pg (ref 26.0–34.0)
MCV: 88.1 fL (ref 78.0–100.0)
RBC: 5 MIL/uL (ref 4.22–5.81)
WBC: 6.1 10*3/uL (ref 4.0–10.5)

## 2010-08-30 LAB — GLUCOSE, CAPILLARY: Glucose-Capillary: 94 mg/dL (ref 70–99)

## 2010-10-27 NOTE — Procedures (Signed)
CAROTID DUPLEX EXAM   INDICATION:  Follow-up evaluation of carotid artery disease.   HISTORY:  Diabetes:  No.  Cardiac:  Coronary artery bypass graft in November 2006 by Dr. Donata Clay.  Hypertension:  Yes.  Smoking:  No.  Previous Surgery:  Right carotid endarterectomy with Dacron patch  angioplasty by Dr. Edilia Bo on January 11, 2006.  CV History:  Patient reports no cardiovascular symptoms at this time.  Amaurosis Fugax No, Paresthesias No, Hemiparesis No                                       RIGHT             LEFT  Brachial systolic pressure:         145               140  Brachial Doppler waveforms:         Triphasic         Triphasic  Vertebral direction of flow:        Antegrade         Antegrade  DUPLEX VELOCITIES (cm/sec)  CCA peak systolic                   72                83  ECA peak systolic                   56                98  ICA peak systolic                   53                92  ICA end diastolic                   23                39  PLAQUE MORPHOLOGY:                  None              Soft plaque  PLAQUE AMOUNT:                      None              Mild  PLAQUE LOCATION:                    None              Proximal ICA   IMPRESSION:  1. No right internal carotid artery stenosis, status post      endarterectomy.  2. A 29-39% left internal carotid artery stenosis.  3. No significant change from the previous study performed on August 10, 2006.   ___________________________________________  Di Kindle. Edilia Bo, M.D.   MC/MEDQ  D:  01/31/2007  T:  02/01/2007  Job:  [id number]

## 2010-10-27 NOTE — Assessment & Plan Note (Signed)
OFFICE VISIT   KONNER, SAIZ  DOB:  06-11-1941                                       01/31/2007  ZOXWR#:[id number]   I saw the patient in the office today for continued followup of his  carotid disease. He had undergone a right carotid endarterectomy in July  of 2007, for a tight right carotid stenosis. He comes in for a routine  followup carotid duplex scan. Since I saw him last in February of this  year, he denies any history of stroke, TIAs, expressive or receptive  aphasia or amaurosis fugax. His only complaint today is that he has been  having pain in his left calf associated with ambulation and relieved  with rest. He tells me this has been going on for six to seven months  and occurs at approximately 5 to 6 blocks. He has had no thigh or hip  claudication. He has had no symptoms in the right leg. He has had no  rest pain and no history of non-healing wounds.   REVIEW OF SYSTEMS:  He has had no recent chest pain, chest pressure or  palpitations or arrhythmias. He has had no bronchitis, asthma or  wheezing.   PHYSICAL EXAMINATION:  Blood pressure 158/82, heart rate 56 and I did  not detect any carotid bruits. LUNGS:  Clear bilaterally to  auscultation. CARDIAC: He has a regular rate and rhythm. ABDOMEN: Soft  and nontender. He has palpable femoral pulses and palpable right  popliteal and right posterior tibial pulse. I could not palpate a  popliteal or pedal pulses on the left side. He has no ischemic ulcers on  his feet. NEUROLOGIC: Examination is nonfocal.   His carotid duplex scan shows that his right carotid endarterectomy site  is widely patent. He has no significant stenosis on the left. Because of  his claudication symptoms, he had requested a Doppler study of his legs  and Doppler study in our office today shows an ABI of 92% on the right  and 81% on the left.   With respect to his carotid disease, I have recommended one more  followup carotid duplex scan in one year. If his duplex scan looks good  at that time, he will not need routine continued followup studies. With  respect to his peripheral vascular disease, I believe he has been  followed by Dr.  Jenne Campus at [facility] and rather  than have [place] both following him I have encouraged him to continue his  followup with Dr.  Jenne Campus. We did discuss the importance of a  structured walking program and Dr.  Jenne Campus already has him on  cilostazol. Fortunately, he is not a smoker. I explained we generally  would not consider revascularization unless he developed disabling  claudication, rest pain, or non-healing ulcer.   Di Kindle. Edilia Bo, M.D.  Electronically Signed   CSD/MEDQ  D:  01/31/2007  T:  02/01/2007  Job:  [id number]   cc:   Darlin Priestly, MD

## 2010-10-27 NOTE — H&P (Signed)
[name], [name]            ACCOUNT NO.:  [id number]   MEDICAL RECORD NO.:  [id number]          PATIENT TYPE:  INP   LOCATION:  1229                         FACILITY:  [facility]   PHYSICIAN:  Beckey Rutter, MD  DATE OF BIRTH:  September 22, 1940   DATE OF ADMISSION:  03/02/2008  DATE OF DISCHARGE:                              HISTORY & PHYSICAL   PRIMARY CARE PHYSICIAN:  Dr. Edwena Blow with [facility].   PRIMARY NEPHROLOGIST:  Terrial Rhodes, MD.   PRIMARY CARDIOLOGIST:  Nanetta Batty, MD.   VASCULAR SURGEON:  Waverly Ferrari, MD.   CHIEF COMPLAINT:  Fever.   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  The patient usually has the fever  associated with chills in the morning which relieved usually at the end  of the day.  He usually remained with severe fatigue and lassitude.  The  fever is around 103 when he measured it.  The patient then called his  primary physician who prescribed Tamiflu and he received 3 days of  Tamiflu medication so far.  Today he felt the fever is very severe and  he started to notice shortness of breath progressed through the last  week to become severe that warranted him to present himself to the  emergency department.   PAST MEDICAL HISTORY:  Significant for:  1. Coronary artery disease with almost three-vessel disease, status      post coronary angiogram in July 2007 done by Dr. Jenne Campus.  2. Status post coronary artery bypass surgery.  3. Status post carotid endarterectomy.  4. Glomerulonephritis.  Patient is taking cyclophosphamide since December 26, 2007.   SOCIAL HISTORY:  The patient is married, he does not have any children,  no significant tobacco abuse or ethanol abuse, lives at home.   FAMILY HISTORY:  No  significant contributory family history.   MEDICATION ALLERGIES:  ADHESIVE TAPE.   MEDICATION:  1. Tamiflu 75 mg twice a day.  2. Vicodin 10/80 once a day.  3. Omeprazole 40 mg once a day.  4. Doxycycline 100 mg twice a day.  5. Prednisone 60 mg once a day.  6. Lasix 20 mg daily.  7. Aspirin 81 mg daily.  8. Vitamin B12 daily.  9. Silvadene topical.  10.Colace daily.  11.Cyclophosphamide 25 mg four times a day started in July.  12.Metoprolol tartrate 12.5 mg twice a day.   REVIEW OF SYSTEMS:  A 69-point review of systems noncontributory,  otherwise as per HPI.  The patient denied significant cough or  significant sputum production.   EXAMINATION:  Last temperature is 97.0, blood pressure is 93/64, pulse  90, respiratory rate is 22.  HEAD:  Atraumatic, normocephalic.  EYES:  PERRL.  MOUTH:  Moist, no ulcer.  NECK:  Obese, no significant JVD.  PRECORDIUM:  First and second heart sound audible, no added sounds.  LUNGS:  Bilateral decreased air sounds with bronchial  breathing, no  rhonchi, rales or wheezes.  ABDOMEN:  Soft, nontender.  Bowel sounds present.  EXTREMITIES:  No lower extremity edema.  BUTTOCKS:  The patient had a 2-cm x 1-cm healing ulcer on the left  buttock cheek.  He has some skin change on the sacrum area with no skin  breakage.   LABS AND X-RAYS:  A 12-lead EKG is showing normal sinus rhythm with  ventricular rate of 94.  Incomplete right bundle branch block,  nonspecific T-wave abnormalities.  Note, the patient has a period of  fast heart rate and the strips look like supraventricular tachycardia.  This is continued for less than minutes.  During that time mentioned, as  per the RN, that the patient's blood pressure decreased.  The patient  was asymptomatic.  Labs and x-ray:  White blood count is 2.1, hemoglobin  is 9.9, hematocrit is 27.8, platelet count is 271 with 91% neutrophils.  Absolute neutrophil is 1.9.  BMP is 90, sodium is 123, potassium 3.5,   chloride 90, bicarb 22, glucose 89, BUN 20, creatinine 1.36.  D-dimer  fibrin derivative is 2.89.  ABG is showing a pH of 7.5, pCO2 is 30.1 and  pO2 is 59.  Chest x-ray showing negative for pneumonia, linear  atelectasis left mid lung zone, pulmonary vascular congestion.  The CT  angiogram was showing negative for PE, diffuse pulmonary edema versus  pneumonitis.  Pulmonary artery hypertension.  Old granulomatous disease.  Slight enlarged subcranial nodes.  Nonspecific.   ASSESSMENT/PLAN:  This is a 70 year old with coronary artery disease,  glomerulonephritis, methicillin-resistant Staphylococcus aureus  infections presented today with shortness of breath and fever for the  last week.  1. 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  2. Hyponatremia  3. Neutropenic fevers.  4. Respiratory alkalosis.  5. Coronary artery disease status post coronary artery bypass graft.  6. Renal insufficiency.   PLAN:  1. The patient will be admitted for further assessment and management.  2. The patient will be admitted to Step-Down for pulse ox monitoring.  3. Will stop the prednisone and start intravenous steroid.  4. Will discontinue Cytoxan and will consider nephrology consultation.  5. For the possible diagnosis of interstitial pneumonitis and      interstitial pulmonary fibrosis, which felt secondary to the      Cytoxan.  Will consider pulmonary input and consultation.  6. Neutropenic fever.  I will start with monotherapy of Zosyn during      this time.  The patient has no absolute neutropenia at the current      time.  I will start the patient on reverse isolation and consider      extended spectrum beta-lactam as monotherapy with Zosyn.  Will send      respiratory secretion for culture and depending on the patient's      condition will increase the antibiotic to include fluoroquinolones      or aminoglycoside, but currently I think monotherapy will be      sufficient for  his neutropenic fever.  7. For the history of coronary artery disease and history of sleep      coronary artery bypass graft, will continue to monitor the patient      and reinstate his Lasix as per his fluid balance.  8. For deep vein thrombosis prophylaxis, will consider Lovenox.  9. For GI prophylaxis, will start Protonix.      Beckey Rutter, MD  Electronically Signed     EME/MEDQ  D:  03/02/2008  T:  03/04/2008  Job:  [id number]   cc:   Edwena Blow, Dr.  [facility]  [address], [address]  [place], [place] [address]

## 2010-10-27 NOTE — Discharge Summary (Signed)
NAMEDEVONTRE, SIEDSCHLAG            ACCOUNT NO.:  [id number]   MEDICAL RECORD NO.:  [id number]          PATIENT TYPE:  INP   LOCATION:  1508                         FACILITY:  [facility]   PHYSICIAN:  R. Roetta Sessions, M.D. DATE OF BIRTH:  05-Oct-1940   DATE OF ADMISSION:  03/02/2008  DATE OF DISCHARGE:  03/08/2008                               DISCHARGE SUMMARY   DISCHARGE DIAGNOSES:  1. Shock.  2. Acute respiratory failure.  3. Acute renal failure in the setting of chronic renal disease  with      baseline creatinine of 1.2.  4. Anemia.  5. Neutropenia from chemotherapy.  6. Hyponatremia.   HISTORY OF PRESENT ILLNESS:  Mr. 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  He presented to the [facility]  today on March 02, 2008, with a fever of 103.  He called his primary  care physician who prescribed Tamiflu and he received three days of  Tamiflu medication without improvement.  He was admitted for further  evaluation and treatment.   PAST MEDICAL HISTORY:  Coronary artery disease status post three vessel  coronary artery bypass grafting per Dr. Donata Clay.  He is followed by  Dr. Nanetta Batty, status post carotid endarterectomy, has  glomerulonephritis for which he has been taking cyclophosphamide since  December 26, 2007.   SOCIAL HISTORY:  He is married but does not have any children.  No  significant tobacco abuse or ethanol abuse.   MEDICATIONS ON ADMISSION:  1. Tamiflu 75 mg b.i.d.  2. Vicodin 10/80 one daily.  3. Omeprazole  40 mg once daily.  4. Doxycycline 100 mg b.i.d.  5. Prednisone 60 mg daily.  6. Lasix 20 mg daily.  7. Aspirin 81 mg daily.  8. Vitamin B-a2 daily.  9. Silvadene topical daily.  10.Colace daily.  11.Cyclophosphamide 25 mg q.i.d. started in July.  12.Metoprolol tartrate 12.5 mg  b.i.d.   LABORATORY DATA:  Blood cultures were negative x2.  C. difficile is  pending.   RADIOGRAPHIC DATA:  CT angio of the chest on March 02, 2008,  demonstrates negative for pulmonary emboli, diffuse pulmonary edema  versus pneumonitis, pulmonary artery hypertension is noted.  Old  granulosus disease is noted.  Slightly enlarge subcarinal node  nonspecific.   Chest x-ray on March 06, 2008, demonstrates continued improvement in  bibasilar aeration with small bowel pleural effusions noted.  Hemoglobin  8.6, but is up from a low of 7.8.  Hematocrit 25.5, platelets 187, WBCs  3.0, up from 1.3.  Sodium 139, potassium 4.2, chloride 108, CO2 25, BUN  24, creatinine 1.51, glucose 112.  Noted his baseline creatinine is 1.3.  Calcium is 5.2, magnesium 2.1, phosphorous 3.7.   HOSPITAL COURSE:  1. Shock of unknown etiology.  This is most likely related to      hypovolemia with early septic shock.  He was treated with IV fluids      and treated with antimicrobial therapy.  Infectious disease was  called to manage his antimicrobials.  He will be discharged with      continuation of Avelox for four more days.  Note that on this      admission he was treated with a plethora of drugs including Avelox,      vancomycin, Bactrim and acyclovir.  His acyclovir was changed to      Valtrex.  On September 21, subsequently decreased.  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  Again, he will continue his      antibiotics for a further four days.  2. Acute respiratory failure.  He was O2 dependent without increased      work of breathing.  We were able to wean his FIO2.  He no longer is      in respiratory failure.  3. Acute on chronic renal disease.  His baseline creatinine was 1.2.      He had been on Cytoxan and prednisone for glomerular nephritis and      is followed by Dr.  Arrie Aran.  His creatinine has stayed in the      1.3 to 1.5 range while in the hospital.  He will be  left off his      Lasix and continued on his prednisone at 40 mg daily.  He has been      instructed to follow up with Dr. Arrie Aran within one week for re-      assumption of further renal drugs at this time.  4. Anemia and neutropenia from chemotherapy.  He was given Neupogen,      and his heparin has been held.  His hemoglobin is now 8.6 on day of      discharge.  WBC is up to 3.1.  5. Fever.  Again, he was treated by antimicrobial therapy and followed      by Infectious Disease.  He will continue on his antibiotics for a      further four days.  6. Hyponatremia.  This was resolved, and no further interventions have      been required.   DISCHARGE MEDICATIONS:  He has been given detailed instructions on his  medications.  He is to take:  1. Omeprazole 40 mg t.i.d.  2. Aspirin 81 mg daily.  3. Vitamin B-12 daily.  4. Silvadene topical to where he has had MRSA infections in the past      on his buttocks daily.  5. He can have a docusate sodium one q.h.s. p.r.n. as needed.  6. Prednisone 40 mg one tablet daily until he is followed by Dr.      Arrie Aran, at which time this can be changed.  7. Avelox 400 mg for the next four days until gone.  8. He has been instructed not to take the Tamiflu, not to take      Voltaren, not to take doxycycline, not to take Lasix, not to take      cyclophosphamide and metoprolol tartrate due to his shock state and      his low blood pressure.  These medications can be reinstituted per      his primary care physician and his specialist at a later date.      There is no need to follow up with pulmonary critical care      specialist.   DISCHARGE INSTRUCTIONS:  1. Diet has been instructed.  He has not been following his diet in  the past as laid down per Cardiology.  He says he is going to      attempt to follow this diet.  2. He is to make an appointment to see Dr .Arrie Aran and Dr. Allyson Sabal      within one week, and his primary care physician in  1-2 weeks.   DISPOSITION/CONDITION ON DISCHARGE:  Improved.      Devra Dopp, MSN, ACNP      R. Roetta Sessions, M.D.  Electronically Signed    SM/MEDQ  D:  03/08/2008  T:  03/08/2008  Job:  [id number]   cc:   Cliffton Asters, M.D.  Fax: [phone]   Nanetta Batty, M.D.  Fax: [phone]   Nilda Simmer, M.D.  Fax: [phone]   Terrial Rhodes, M.D.  Fax: [phone]   Di Kindle. Edilia Bo, M.D.  [address]  [place]  [place] [address]

## 2010-10-27 NOTE — Consult Note (Signed)
VASCULAR SURGERY CONSULTATION   Natarajan, Dezmon S  DOB:  [birth year]                                       11/28/2007  EAVWU#:[id number]   REASON FOR CONSULTATION:  Celiac artery stenosis.   HISTORY:  This is a pleasant 70 year old gentleman who I had previously  performed a right carotid endarterectomy on in July of 2007.  I have  been following his carotids since then and he is due for a routine  followup study in August of this year.  On his study in August of 2008  the right carotid endarterectomy site was widely patent without evidence  of restenosis and he had no significant carotid stenosis on the left.   He has recently been diagnosed with some mild renal insufficiency and  has undergone an extensive workup and is felt to most likely have a  glomerulonephritis.  He tells me that he has had ultrasound of his  kidneys which according to him showed normal sized kidneys.  He  subsequently had a renal artery duplex which again he reports showed no  evidence of renal artery stenosis.  I do not have these reports  available; they were done elsewhere.  It was noted, however, also on his  duplex scan when they were evaluating his renal arteries that he had  some stenosis in the celiac axis and for this reason a vascular surgery  consultation was obtained by Dr. Katrinka Blazing.   The patient denies any history of significant weight loss or  postprandial abdominal pain.  His risk factors for atherosclerosis  include coronary artery disease (he has undergoing coronary  revascularization by Dr. Donata Clay), he denies any history of diabetes.  He does have a history of hyperlipidemia and hypertension.   FAMILY HISTORY:  There is no history of premature cardiovascular  disease.   SOCIAL HISTORY:  He is married.  He does not have any children.  He does  not use tobacco.   REVIEW OF SYSTEMS:  GENERAL:  He has had no recent weight loss, weight  gain or problems with his  appetite.  CARDIAC:  He has had no chest pain, chest pressure, palpitations or  arrhythmias.  PULMONARY:  He has had no bronchitis, asthma or wheezing.  NEURO:  He has had no history of stroke, TIAs, expressive or receptive  aphasia or amaurosis fugax.  VASCULAR:  He does have some mild lower  extremity claudication which has been stable.  GI:  He has had some occasional diarrhea.   PHYSICAL EXAMINATION:  Vital signs:  His blood pressure is 122/70, heart  rate is 55.  General:  This is a pleasant 70 year old gentleman who  appears his stated age.  HEENT:  Neck is supple.  There is no cervical  lymphadenopathy.  I do not detect any carotid bruits.  Lungs:  Are clear  bilaterally to auscultation.  Cardiac:  He has a regular rate and  rhythm.  Abdomen:  His abdomen is soft and nontender.  I cannot  appreciate any abdominal bruits.  He has palpable femoral pulses with  warm, well-perfused feet.  No significant lower extremity swelling.  Neurological:  Exam is nonfocal.   With respect to the celiac artery stenosis he has no weight loss and no  postprandial abdominal pain and therefore this is asymptomatic.  I would  not recommend any further  workup for this unless he develops any  significant weight loss or postprandial abdominal pain.  I have  explained to him that the intestine is supplied by the celiac, SMA and  IMA and that most often when one of these vessels has a stenosis the  others are able to fully compensate.   With respect to his renal artery evaluation he has had a renal artery  duplex which showed no evidence of renal artery stenosis and I do not  think any further workup is necessary at this is a fairly accurate test.  His blood pressure appears well controlled.   With respect to his carotid disease he is due for a followup carotid  duplex scan in August of this year.  I will see him back at that time.  He does know to remain on aspirin although his dose has been cut to  81  mg a day.  The remainder of his medications are documented on the  medical history form in his chart.   Di Kindle. Edilia Bo, M.D.  Electronically Signed  CSD/MEDQ  D:  11/28/2007  T:  11/29/2007  Job:  [id number]   cc:   Nilda Simmer, M.D.

## 2010-10-30 NOTE — Op Note (Signed)
NAMEVICTORMANUEL, MCLURE            ACCOUNT NO.:  [id number]   MEDICAL RECORD NO.:  [id number]          PATIENT TYPE:  INP   LOCATION:  2807                         FACILITY:  [facility]   PHYSICIAN:  Di Kindle. Edilia Bo, M.D.DATE OF BIRTH:  September 07, 1940   DATE OF PROCEDURE:  01/10/2006  DATE OF DISCHARGE:                                 OPERATIVE REPORT   PREOPERATIVE DIAGNOSIS:  Tight right carotid stenosis.   POSTOPERATIVE DIAGNOSIS:  Tight right carotid stenosis.   PROCEDURES:  1.  Arch aortogram.  2.  Selective innominate arteriogram.  3.  Selective right common carotid arteriogram.  4.  Selective left common carotid arteriogram.   SURGEON:  Di Kindle. Edilia Bo, M.D.   ANESTHESIA:  Local.   INDICATIONS:  This is a pleasant 70 year old gentleman whom I have been  following with a moderate right carotid stenosis.  On a follow-up visit in  May, this had progressed significantly to greater than 80%, and right  carotid endarterectomy was recommended.  However, the duplex suggested that  this stenosis was extended fairly high, and we could not see beyond the  stenosis with the duplex.  For this reason, cerebral arteriogram was  recommended.  Ultimately, he agreed to proceed with this, and this was to  determine if this right carotid stenosis was surgically accessible.  The  procedure and potential complications were discussed with the patient prior  to the procedure.   TECHNIQUE:  The patient was taken to the PV lab at [facility].  The groins were  prepped and draped in the usual sterile fashion.  After the skin was  infiltrated with 1% lidocaine, the right common femoral artery was  cannulated and a guide wire introduced into the infrarenal aorta under  fluoroscopic control.  The 5-French sheath was introduced over the wire, and  the dilator was then removed.  The pigtail catheter was advanced over the  wire and positioned in the ascending aortic arch.  At a 40-degree LAO  projection, arch aortogram was obtained.  The pigtail catheter was then  exchanged for an H1 catheter which was positioned into the innominate  artery.  Selective innominate arteriogram was performed to better visualize  the proximal common carotid artery and subclavian artery on the right.  Next, the wire was advanced into the right common carotid artery and the  catheter advanced over the wire, and a selective right common carotid  arteriogram was obtained.  Intracranial and extracranial views were  obtained.  Of note, the intracranial views will be interpreted separately by  the neuro-radiologist.  Next, the H1 catheter was backed into the arch and  positioned into the left common carotid arteriogram, and a selective left  common carotid arteriogram was obtained.   FINDINGS:  Arch aortogram demonstrates no significant plaque within the  aortic arch.  The innominate, right subclavian, right vertebral, and right  common carotid artery are widely patent.  The left common carotid artery,  left subclavian, and left vertebral artery are also patent.   Selective right common carotid artery injection demonstrates a critical  proximal internal carotid artery stenosis on the right with the  string sign  and very slow flow distal to the stenosis.  This is a 99+% stenosis.  The  internal carotid artery above the plaque is patent although fills poorly  because of the proximal stenosis.   On the left side, there is no significant bifurcation stenosis noted.  The  common, external, and internal carotid arteries on the left are widely  patent.   CONCLUSIONS:  Critical right proximal common carotid artery stenosis with  string sign as described above.      Di Kindle. Edilia Bo, M.D.  Electronically Signed     CSD/MEDQ  D:  01/10/2006  T:  01/10/2006  Job:  [id number]

## 2010-10-30 NOTE — H&P (Signed)
[name], [name]            ACCOUNT NO.:  [id number]   MEDICAL RECORD NO.:  [id number]          PATIENT TYPE:  AMB   LOCATION:  SDS                          FACILITY:  [facility]   PHYSICIAN:  Di Kindle. Edilia Bo, M.D.DATE OF BIRTH:  08-28-1940   DATE OF ADMISSION:  01/10/2006  DATE OF DISCHARGE:                                HISTORY & PHYSICAL   REASON FOR ADMISSION:  Critical right carotid stenosis.   HISTORY:  This is a pleasant 70 year old gentleman who I had been following  with a mild moderate right carotid stenosis.  I originally had seen him in  consultation November 2006 with a 60 to 80 cm right carotid stenosis.  Of  note, this was found when he was being evaluated for coronary artery disease  and ultimately underwent coronary revascularization by Dr. Donata Clay on  05/12/2005.  He presented to me on 11/10/2005 with a 66-month follow-up  carotid duplex scan.  This showed that the stenosis had progressed  significantly and was clearly greater than 80% now with an end-diastolic  velocity of 157 cm/sec.  The technician was unable to see beyond the  internal carotid artery stenosis on the right and was difficult to determine  how far the plaque extended based on the duplex.  For that reason I  recommended a cerebral arteriogram prior to considering surgery to be  certain that the stenosis was surgically accessible.  He was in the process  of undergoing cardiac workup by Dr. Jenne Campus and we agreed to proceed with  study after his cardiac workup was complete.  He underwent a cerebral  arteriogram today which demonstrated a very tight critical right carotid  stenosis with a string sign.  He is being admitted for heparin and planned  right carotid endarterectomy tomorrow.  Of note the patient denies any  history of stroke, TIAs, expressive or receptive aphasia, or amaurosis  fugax.  His past medical history is significant for coronary disease and he  has undergone coronary  revascularization by Dr. Donata Clay as described  above.  He denies any history of diabetes.  He does have a history of  hyperlipidemia and hypertension.   PAST SURGICAL HISTORY:  Significant for coronary vascularization November  2006 by Dr. Donata Clay.   FAMILY HISTORY:  There is no history of premature cardiovascular disease.   SOCIAL HISTORY:  He is married.  He does not have any children.  He does not  use tobacco.  Does not use alcohol.   MEDICATIONS ON ADMISSION:  1.  Enteric coated aspirin 325 mg p.o. daily.  2.  Metoprolol 25 mg p.o. b.i.d.  3.  Nexium 40 mg p.o. daily.  4.  Vytorin 10/20 p.o. daily.  5.  Stool softener 1 p.o. b.i.d.   ALLERGIES:  He has reaction to ADHESIVE TAPE BUT NO KNOWN DRUG ALLERGIES.   REVIEW OF SYSTEMS:  GENERAL:  He had no weight loss, weight gain or problems  with his appetite. He is 140 pounds, 5 feet 9 inches tall.  CARDIAC:  He had no chest pain, chest pressure, palpitations or arrhythmias.  He has a  history of atrial fibrillation in the past but has had no recent  arrhythmias.  PULMONARY:  He has had no bronchitis, asthma or wheezing.  GI: He had no recent change in his bowel habits and has no history of peptic  ulcer disease or hiatal hernia.  GU: He has had no dysuria or frequency.  VASCULAR:  He has occasional pain in his legs associated ambulation with no  history of rest pain.  No history of nonhealing wounds.  He had no history  of DVT or phlebitis.  NEURO:  He had no dizziness, blackouts, headaches or seizures.  ORTHO/SKIN:  He has had no arthritis, joint pain, muscle pain or rash.  PSYCHIATRIC:  He had no depression or nervousness.  HEMATOLOGIC:  He has had no bleeding problems or clotting disorders.  VITAL SIGNS:  Blood pressure is 110/80, heart rate is 64.  HEENT: I do not detect any carotid bruits.  LUNGS:  Clear bilaterally to auscultation.  CARDIAC EXAM:  He has a regular rate and rhythm.  ABDOMEN: Soft and nontender.   EXTREMITIES:  He has no significant lower extremity swelling.  Has palpable  femoral pulses with warm well-perfused feet. NEUROLOGIC:  h H has no focal  motor or sensory deficit.   Carotid duplex scan shows a critical greater than 80% right carotid  stenosis.  Cerebral arteriogram shows a string sign on the right with a  patent internal carotid artery distally and no significant stenosis on the  left.   Given the severity of the stenosis on the right I have recommended right  carotid endarterectomy in order to lower his risk of future stroke.  He is  being admitted to the hospital for heparin with plans for surgery first  thing in the morning.  We have discussed indications for the procedure and  potential complications including but not limited to bleeding, MI, stroke  (Periprocedural risks 1-2%), nerve injury, wound healing problems or  intracranial bleeding.  All his questions were answered.  He is agreeable to  proceed.  His procedure has been scheduled for 01/11/2006.      Di Kindle. Edilia Bo, M.D.  Electronically Signed     CSD/MEDQ  D:  01/10/2006  T:  01/10/2006  Job:  [id number]   cc:   Darlin Priestly, MD  Kerin Perna, M.D.

## 2010-10-30 NOTE — Cardiovascular Report (Signed)
[name], [name]            ACCOUNT NO.:  [id number]   MEDICAL RECORD NO.:  [id number]          PATIENT TYPE:  INP   LOCATION:  3711                         FACILITY:  [facility]   PHYSICIAN:  Madaline Savage, M.D.DATE OF BIRTH:  1940/12/22   DATE OF PROCEDURE:  05/10/2005  DATE OF DISCHARGE:                              CARDIAC CATHETERIZATION   PROCEDURES PERFORMED:  1.  Selective coronary angiography by Judkins technique.  2.  Retrograde left heart catheterization.  3.  Left ventricular angiography.  4.  Abdominal aortography.  5.  Internal mammary artery nonselective injection.   COMPLICATIONS:  None.   PATIENT PROFILE:  The patient is a 70 year old white married male who is a  cardiology patient of Dr. Lenise Herald and a primary medical patient of  Dr. Gearldine Bienenstock at the [facility] [address] [place].  Cardiolite stress test was ordered and showed lateral ischemia. He had ST-  segment depression with exercise in the inferior and lateral leads. He was  scheduled to have an outpatient cardiac catheterization on Tuesday May 10, 2005, but instead came to the hospital May 09, 2005 and was  admitted for chest pain. He had no ischemic changes on his electrocardiogram  and his cardiac enzymes have been negative. The patient is treated for  hyperlipidemia and has a history of inner ear disturbance with dizziness. He  has intolerance to Crestor in the past.   Family history shows a father dying of congestive heart failure at age 89.  One brother who is alive and well and one brother died at an early age for  unknown reasons. 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 He does  not use tobacco.   RESULTS:  The left ventricular pressure was 155/6, end-diastolic pressure  20. Central aortic pressure 155/75, mean of 110. No aortic valve gradient by  pullback technique.   ANGIOGRAPHIC RESULTS:  The patient shows vascular calcification along his  proximal LAD under fluoroscopy. We do not see any valvular or pericardial  calcifications.   The left main coronary artery is very difficult to evaluate because it is  very small. It is short and every time the 6-French catheter intubate the  vessel there is damping of pressure. I suspect there is a left main disease  but it is so short and so small it is hard to demonstrate in any view.   The LAD contains an 85% stenosis just prior to septal perforator branch #1  and a second 95% stenosis just after septal perforator branch, so proximal  and mid-LAD both show stenoses of 95%. The distal two-thirds of the LAD  appeared to have a smooth contour, do not appear to be calcified and appear  to be an excellent recipient for bypass graft. The LAD coursed to the  cardiac apex and bifurcates in to two small branches.   One long but small diagonal branch is 90-95% stenosed proximally and does  appear to be an adequate candidate for bypass grafting.   The left circumflex is nondominant and gives rise to two very proximal  obtuse marginal branches. Both obtuse  marginal branches show proximal  disease of 90-95% and in the second of these two obtuse marginal branches  there is delayed TIMI flow.   The right coronary artery shows damping of pressure with a 6-French catheter  intubation. There is soft 75% obstructive plaque in the proximal RCA. The  distal RCA is 2 mm in diameter and potentially bypass graftable.   The left internal mammary artery shows that this native vessel contains an  area 30% eccentric stenosis and an otherwise smooth vessel. There is a  widely patent internal mammary artery graft proximal mid and distal.   Left ventricular angiography shows good contractility of all wall segments  with ejection fraction estimate of 55-65%. There is mild to moderate mitral  regurgitation.   Abdominal aortography shows both renal arteries to be single with  bifurcations proximally and all  visualizations of the renal artery in this  view are normal. Abdominal aorta does not appear to be dilated. There is  some sclerotic change in the distal portion of this aorta.   FINAL DIAGNOSES:  1.  Multivessel native coronary artery disease.      1.  95% proximal left anterior descending artery and 95% mid-left          anterior descending artery.      2.  90% proximal diagonal.      3.  90% proximal obtuse marginal branch #1 of circumflex.      4.  90% proximal obtuse marginal-2 of circumflex.      5.  75% proximal right coronary artery in a dominant vessel.  2.  Patent internal mammary artery.  3.  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  4.  Abdominal aorta sclerotic with no abdominal aortic aneurysm.  5.  Patent renal arteries.  6.  Normal left ventricular ejection fraction.  7.  Mild to moderate mitral regurgitation with 2-D echocardiogram to be      ordered for further evaluation.   PLAN:  Continue medical therapy of this patient and cardiovascular thoracic  consultation regarding coronary bypass grafting whenever possible.           ______________________________  Madaline Savage, M.D.     WHG/MEDQ  D:  05/10/2005  T:  05/11/2005  Job:  [id number]   cc:   Darlin Priestly, MD  Fax: [phone]   Nilda Simmer, M.D.  Fax: [phone]   [facility]  [address], [place], [place]   Cardiovascular Thoracic Surgeon  [place], [place]   [facility] Medical Records

## 2010-10-30 NOTE — Discharge Summary (Signed)
NAMEEMMANUELLE, COXE            ACCOUNT NO.:  [id number]   MEDICAL RECORD NO.:  [id number]          PATIENT TYPE:  INP   LOCATION:  2007                         FACILITY:  [facility]   PHYSICIAN:  Di Kindle. Edilia Bo, M.D.DATE OF BIRTH:  1940-10-05   DATE OF ADMISSION:  01/10/2006  DATE OF DISCHARGE:                                 DISCHARGE SUMMARY   DATE OF BIRTH:  [birth year]   DATE OF ADMISSION:  January 10, 2006   ANTICIPATED DATE OF DISCHARGE:  January 13, 2006   ADMISSION DIAGNOSIS:  Tight right carotid artery stenosis.   DISCHARGE/SECONDARY DIAGNOSES:  1.  Tight right carotid artery stenosis, status post right carotid      endarterectomy.  2.  History of coronary artery disease, status post coronary artery bypass      graft.  3.  History of transient atrial fibrillation in 2006.  4.  History of anal fistula repair.  5.  History of left lung surgery for adhesions.  6.  Gastroesophageal reflux disease.  7.  Sensitivity to adhesive tape.   PROCEDURES:  1.  January 10, 2006, arch aortogram with selective innominate right common      carotid and left common carotid arteriogram by Dr. Waverly Ferrari      showing no significant plaque within the aortic arch.  There was      clinical right proximal common carotid artery stenosis with string sign      (99% stenosis).  On the left there was no significant stenosis at the      bifurcation.  The common, external, and internal carotid arteries patent      on the left.  2.  January 11, 2006, right carotid endarterectomy with Dacron patch      angioplasty by Dr. Waverly Ferrari.   BRIEF HISTORY:  Mr. Couse is a 70 year old male who Dr. Edilia Bo has been  following for moderate right carotid stenosis.  On a followup visit this  past May, he had progressed significantly to greater than 80% and a right  carotid endarterectomy was recommended.  However, the duplex suggested this  stenosis was extended fairly high and could not be  seen beyond the stenosis  with the duplex.  For this reason, cerebral arteriogram was recommended.  After discussing the risks and benefits, he ultimately agreed to proceed to  determine if his right carotid stenosis was surgically successful.   HOSPITAL COURSE:  On January 10, 2006, Mr. Grzelak was admitted to [facility] after undergoing a cerebral arteriogram which showed a critical  right carotid artery stenosis with string sign.  He was started on IV  heparin and scheduled for carotid endarterectomy the following day.  Of  note, the patient remained asymptomatic of his carotid artery disease.  On  January 11, 2006, he did undergo a right carotid endarterectomy as anticipated.  On postoperative day 1 he remained stable neurologically intact.  However,  he did report some nausea, weakness, and constipation.  Therefore, it was  felt he should remain hospitalized for an additional day to give him time to  progress in his  mobility.  A Fleet enema is also ordered to treat his  constipation and his nausea was treated with antiemetics as needed, and his  diet was slowly advanced.  Otherwise, vitals were stable showing blood  pressure 130/55, heart rate was in sinus rhythm and 82, and he was afebrile.  His neck incision was stable without evidence of hematoma.  Postoperative  labs showed a sodium of 136, potassium 3.4 which supplemented, BUN of 4,  creatinine of 0.8, and blood sugar 124.  White blood count 12.8, hemoglobin  13, hematocrit 39, and platelet count 201.  If Mr. Faulkenberry makes good  progress over the next 24 hours, it is anticipated he will be ready for  discharge home on postoperative day 2, January 13, 2006.   DISCHARGE MEDICATIONS:  1.  Aspirin 325 mg p.o. daily.  2.  Metoprolol 25 mg p.o. b.i.d.  3.  Nexium 40 mg p.o. daily.  4.  Vytorin 10/20 mg p.o. daily.  5.  Stool softener 1 p.o. b.i.d.  6.  Tylox 1-2 tablets p.o. q.4 hours p.r.n. pain.   DISCHARGE INSTRUCTIONS:  He  was instructed to resume his preoperative diet.  He is to increase his activity slowly and was to avoid driving or heavy  lifting for the next 2 weeks.  He may shower and clean his incision gently  with soap and water and to notify the CTS office if he develops fever  greater than 101, or redness or drainage from his incision site.  He is to  follow up with Dr. Edilia Bo at the CTS office on February 02, 2006, at 2:30  p.m. and to call sooner if needed.      Jerold Coombe, P.A.      Di Kindle. Edilia Bo, M.D.  Electronically Signed    AWZ/MEDQ  D:  01/12/2006  T:  01/12/2006  Job:  [id number]   cc:   Darlin Priestly, MD  Kerin Perna, M.D.  Reita Chard

## 2010-10-30 NOTE — Consult Note (Signed)
[name], WEAKLAND            ACCOUNT NO.:  [id number]   MEDICAL RECORD NO.:  [id number]          PATIENT TYPE:  INP   LOCATION:  2312                         FACILITY:  [facility]   PHYSICIAN:  Zenon Mayo, MDDATE OF BIRTH:  1940/07/10   DATE OF CONSULTATION:  05/12/2005  DATE OF DISCHARGE:                                   CONSULTATION   PROCEDURE:  Transesophageal echocardiogram.   DESCRIPTION:  Mr. Himes is a 70 year old male with history of hypertension,  coronary artery disease who was brought to the operating room today by Dr.  Donata Clay for coronary artery bypass grafting.  There was mention of mitral  regurgitation on catheterization so echocardiogram was requested  intraoperatively to evaluate the mitral valve as well as the rest of the  cardiac function.  The patient was brought to the operating room and placed  under general anesthesia.  After confirmation of endotracheal tube placement  a transesophageal echocardiogram probe was placed in the patient's esophagus  without complication.  The following views were obtained.   1.  The left ventricle was normal in appearance.  There was no left      ventricular hypertrophy noted.  There were no wall motion abnormalities      seen.  The ejection fraction was estimated to be 60%.  2.  Mitral valve.  The valve leaflets were free from calcification or      vegetation.  The leaflets appeared to coapt well.  The anterior leaflet      did exhibit some billowing; however, the leaflets did appear to coapt      well.  When color was placed across the valve mild to moderate mitral      regurgitation was seen.  This regurgitant jet did not affect pulmonary      venous flow as this was normal.  The aortic valve was imaged next.  The      aortic valve was trileaflet in nature and no calcification or      vegetations were seen.  The aortic valve is 2.39 sq cm.  No aortic      insufficiency noted.  The pulmonic valve was imaged and  revealed trace      pulmonic regurgitation.  The tricuspid valve revealed mild tricuspid      insufficiency.  The inner atrial septum was intact.  The thoracic aortic      revealed minimal atherosclerotic disease more so in the distal aspect      than in the proximal.   At the conclusion of bypass the patient's heart was again imaged.  Left  ventricular function remained unchanged from pre bypass values.  The heart  continued to exhibit normal wall motion throughout with no segmental  abnormalities.  All other structures and functions of the heart remained  unchanged from pre bypass evaluation.  The aortic cannula was removed  without any evidence of aortic dissection.  The patient was maintained on a  dopamine infusion to maintain cardiac output which were in the normal ranges  throughout the post bypass.  At the conclusion of the procedure patient was  brought directly from the operating room to the intensive care unit in  stable condition.  Also at the conclusion of the procedure the  echocardiogram probe was removed without complication.           ______________________________  Zenon Mayo, MD     WEF/MEDQ  D:  05/12/2005  T:  05/13/2005  Job:  [id number]   cc:   Anesthesia office

## 2010-10-30 NOTE — Consult Note (Signed)
[name], [name]            ACCOUNT NO.:  [id number]   MEDICAL RECORD NO.:  [id number]          PATIENT TYPE:  INP   LOCATION:  3711                         FACILITY:  [facility]   PHYSICIAN:  Di Kindle. Edilia Bo, M.D.DATE OF BIRTH:  09-Oct-1940   DATE OF CONSULTATION:  05/11/2005  DATE OF DISCHARGE:                                   CONSULTATION   REASON FOR CONSULTATION:  Right carotid stenosis.   HISTORY:  This is a pleasant 70 year old right-handed gentleman who had some  vague chest pain over the last 2 weeks. He was admitted on May 09, 2005, when he had had some epigastric pain and also discomfort in the left  arm. He had had a positive stress test and underwent cardiac  catheterization, which showed multivessel coronary artery disease. He is  scheduled for coronary revascularization by Dr. Donata Clay. As part of his  preoperative workup, he underwent a carotid duplex scan, which showed a 60-  80% right carotid stenosis with no significant stenosis on the left. Both  vertebral arteries were patent with normally directed flow. Vascular surgery  was consulted concerning his right carotid stenosis.  The consult from Dr.  Donata Clay.   This patient denies any previous history of stroke, TIAs, expressive or  receptive aphasia, or amaurosis fugax. He does have some occasional  dizziness, which he attributes to an inner ear problem. The arm discomfort  which he had on admission, he states, was simply a discomfort related to the  epigastric pain, did not think there was any associated weakness or  paresthesias.   His past medical history is significant for hypercholesterolemia. He denies  any history of diabetes, hypertension, history of previous myocardial  infarction, history of congestive heart failure, or history of emphysema.   PAST SURGICAL HISTORY:  1.  Significant for a lung biopsy in 1989; biopsy was reportedly benign.  2.  He had anal fissure surgery.   FAMILY  HISTORY:  Mother died at age 19  from complications of diabetes.  Father died at age 8 after a stroke and later congestive heart failure. He  is unaware of any history of premature cardiovascular disease.   SOCIAL HISTORY:  He is married. He does not have any children. He is a  retired Product/process development scientist. He does not use tobacco and never has used tobacco.   REVIEW OF SYSTEMS:  He does admit to some occasional pain in his calves  associated with ambulation.  He has had no history of rest pain or  nonhealing wounds. He has had no history of DVT or phlebitis. The remainder  of his review of systems is documented on his admission history and  physical.   PHYSICAL EXAMINATION:  VITAL SIGNS:  Temperature is 98.4, blood pressure  103/65, heart rate is 61  HEENT:  I do not detect any carotid bruits. There is no cervical  lymphadenopathy.  LUNGS:  Clear bilaterally to auscultation.  CARDIAC EXAM:  He has a regular rate and rhythm.  ABDOMEN:  Soft and nontender. I cannot appreciate an aneurysm.  EXTREMITIES:  He has palpable femoral pulses. I cannot  palpate dorsalis  pedis pulses but he does have posterior tibial pulses bilaterally. Both feet  are warm and adequately perfused. There is no significant lower extremity  swelling.  NEUROLOGIC:  Exam reveals no significant motor or sensory deficit.   I have reviewed his carotid duplex scan, which shows a 60 to 80% right  carotid stenosis in the higher end of this range. There is no significant  carotid stenosis on the left and both vertebral arteries are patent with  normally directed flow. Arm pressures are equal.   Arterial Doppler study of the lower extremity shows ABIs of 100% bilaterally  with triphasic posterior tibial wave forms and a monophasic dorsalis pedis  signal on the right with a triphasic posterior dorsalis pedis signal on the  left.   IMPRESSION:  This patient presents with symptomatic coronary artery disease.  He appears to have an  asymptomatic 60-80% right carotid stenosis. I would  recommend a carotid duplex scan in 6 months and explained that we would  generally only consider right carotid endarterectomy if the stenosis  progressed to clearly greater than 80% or he developed symptoms related to  this stenosis. I do not think that I would significantly lower his risk of  perioperative stroke during CABG with the combined procedure given that he  is asymptomatic and the stenosis is less than 80%. I will arrange for his  follow-up duplex scan in 6 months. We have also discussed the importance of  continuing his aspirin indefinitely.      Di Kindle. Edilia Bo, M.D.  Electronically Signed     CSD/MEDQ  D:  05/11/2005  T:  05/11/2005  Job:  [id number]

## 2010-10-30 NOTE — Op Note (Signed)
NAMEJODECI, [name]            ACCOUNT NO.:  [id number]   MEDICAL RECORD NO.:  [id number]          PATIENT TYPE:  INP   LOCATION:  2550                         FACILITY:  [facility]   PHYSICIAN:  Di Kindle. Edilia Bo, M.D.DATE OF BIRTH:  Aug 08, 1940   DATE OF PROCEDURE:  01/11/2006  DATE OF DISCHARGE:                                 OPERATIVE REPORT   PREOPERATIVE DIAGNOSIS:  Right carotid stenosis.   POSTOPERATIVE DIAGNOSIS:  Right carotid stenosis.   PROCEDURE:  Right carotid endarterectomy with Dacron patch angioplasty.   SURGEON:  Dr. Edilia Bo.   ASSISTANTS:  1.  Fabienne Bruns, M.D.  2.  Coral Ceo, [place]   ANESTHESIA:  General.   INDICATIONS:  This is a 69 year old gentleman, who I have been following  with a moderate right carotid stenosis.  This progressed significantly over  6 months to greater than 80% and by duplex it was difficult to determine how  high the stenosis was.  Therefore cerebral arteriogram was recommended.  This showed a string sign and a 99% stenosis and very slow flow distally  beyond the stenosis in the internal carotid artery.  It did however appear  surgically accessible.  The procedure and potential complications were  discussed with the patient and his wife preoperatively.  All their questions  were answered and he was agreeable to proceed.   TECHNIQUE:  The patient was taken to the operating room and received a  general anesthetic.  Arterial line had been placed by Anesthesia.  The neck  and upper chest on the right were prepped and draped in the usual sterile  fashion.  Incision was made along the anterior border of the  sternocleidomastoid and dissection carried down to the common carotid  artery, which was dissected free and controlled with a Rumel tourniquet.  The facial vein was divided between 2-0 silk ties.  The internal carotid  artery above the plaque was controlled with vessel loop.  Three branches of  the external carotid artery  which came off right at the origin were  controlled with vessel loops.  The patient was then heparinized.  Clamps  then placed on the internal, then the common and then the external carotid  artery.  A longitudinal arteriotomy was made in the common carotid artery.  This was extended through the plaque into the internal carotid artery above  the plaque.  A 12 shunt was placed into the internal carotid artery, back  bled and then placed into the common carotid artery and secured with Rumel  tourniquet.  Flow was re-established through the shunt.  An endarterectomy  plane was established proximally and the plaque was sharply divided.  Eversion endarterectomy was performed of the external carotid artery.  Distally there was a nice taper in the plaque and no tacking sutures were  required.  The artery was irrigated with copious amounts of heparin and  dextran and all loose debris removed.  A Dacron patch was then sewn using  continuous 6-0 Prolene suture.  Prior to completing the patch closure the  shunt was removed.  The arteries back bled and flushed appropriately and the  anastomosis completed.  Flow was re-established first to the external  carotid artery and then to the internal carotid artery.  At the completion,  there was a good pulse distal to the patch and good pulse.  Hemostasis was  obtained in the wound.  The  wound was closed with a deep layer of 3-0 Vicryl.  The platysma was closed  with 3-0 Vicryl.  Skin was closed with 4-0 subcuticular stitch.  Sterile  dressing was applied.  The patient tolerated procedure well and was  transferred to the recovery room in satisfactory condition.  All needle and  sponge counts were correct.      Di Kindle. Edilia Bo, M.D.  Electronically Signed     CSD/MEDQ  D:  01/11/2006  T:  01/11/2006  Job:  [id number]   cc:   Dr. Jenne Campus

## 2010-10-30 NOTE — Op Note (Signed)
[name], [name]            ACCOUNT NO.:  [id number]   MEDICAL RECORD NO.:  [id number]          PATIENT TYPE:  INP   LOCATION:  2312                         FACILITY:  [facility]   PHYSICIAN:  [name], M.D.  DATE OF BIRTH:  September 23, 1940   DATE OF PROCEDURE:  05/12/2005  DATE OF DISCHARGE:                                 OPERATIVE REPORT   OPERATION:  Coronary bypass grafting x4 (left internal mammary artery to  LAD, saphenous vein graft to diagonal, saphenous vein graft to circumflex  marginal, saphenous vein graft to right coronary artery).   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   SURGEON:  [name], M.D.   ASSISTANT:  Jose Leisure, PA-C.   ANESTHESIA:  General by Dr. Autumn Price.   INDICATIONS:  The patient is a 70 year old white male who presented with  symptoms of unstable angina and a positive stress test. Cardiac  catheterization demonstrated severe three-vessel coronary artery disease  with 90% stenosis of the LAD diagonal, 80% stenosis of the circumflex and  80% stenosis of the right coronary. His LV systolic function was well-  preserved. He had evidence of mitral valve prolapse with mild 1+ MR from  prolapse of the anterior leaflet. Based on his coronary anatomy and  symptoms, he was felt to be a candidate for surgical revascularization.  I  spoke to the patient preoperatively on two occasions and reviewed the  indications and expected benefits of coronary artery bypass surgery for  treatment as coronary artery disease. I reviewed the alternatives to  surgical therapy as well. I discussed with him the major details of the  operation including the choice of conduit, location of the surgical  incisions, use of general anesthesia and cardiopulmonary bypass, and  expected  postoperative hospital recovery. I discussed with him the risks to  him of coronary artery bypass surgery including risks of MI, CVA, bleeding,  infection, stroke, and death. His preoperative Doppler studies demonstrated  a 75 to 80% stenosis of the right internal carotid artery and he was  evaluated by Dr. Waverly Price preoperatively who felt that a combined  CABG carotid operation was not indicated and that his carotid arterial  disease should be followed with serial 2-D echoes. He understands that he  does have risk of stroke due to the operation. He agrees to proceed with the  operation after reviewing these issues and demonstrating his understanding.   OPERATIVE FINDINGS:  The patient had a transesophageal 2-D echo performed in  the operating room which showed mitral valve disease minimal with mild  prolapse of the anterior leaflet and trace insignificant mitral  regurgitation. His LV function is preserved. The patient's coronaries are  extremely small other than the LAD. He would not be a good candidate for  redo bypass surgery as we needed to harvest veins from both legs using  endoscopic harvest technique and due to the fact that his coronary arterial  disease is so severe. The patient was  given aprotinin protocol to reduce the  blood loss and blood transfusion requirement. He did receive 1 unit of  packed cells in the operating room for a hemoglobin of 7 g received 1 unit  of platelets in the operating room for post pump coagulopathy after reversal  of the heparin with protamine.   PROCEDURE:  The patient was brought to the operative room, placed supine on  the operating room table.  General anesthesia was induced under invasive  hemodynamic monitoring. The chest, abdomen and legs were prepped with  Betadine and draped as a sterile field. A sternal incision was made as the  saphenous vein was harvested endoscopically from both legs. The internal  mammary artery was  harvested as a pedicle graft from its origin at the  subclavian vessels. This was a good vessel with excellent flow. Heparin was  administered and the ACT was documented as being therapeutic. The sternal  retractor was placed and the pericardium was opened and suspended.  Pursestrings were placed in the ascending aorta and right atrium and the  patient was cannulated and placed on bypass. The coronary arteries were  identified for grafting. The main right coronary was a small but graftable  vessel. The LAD was a adequate target. The diagonal was a small but adequate  target. The first marginal was heavily diffusely diseased and was too small  and too heavily diseased to graft. The distal circumflex was small but a  graftable vessel. The mammary artery and vein grafts were prepared for the  distal anastomoses and a cardioplegia catheter was placed in the ascending  aorta. The patient was cooled to 32 degrees.  The aortic crossclamp was  applied. There was 800 mL of cold blood cardioplegia delivered to the aortic  root with good cardioplegic arrest and septal temperature dropping less than  14 degrees. Topical iced saline was used to augment myocardial preservation  and a pericardial insulator pad was used to protect the left phrenic nerve.   The distal coronary anastomoses were then performed. The first distal  anastomosis was the mid-right coronary. This was a 1.2 mm vessel with  proximal 80% stenosis.  A reverse saphenous vein was sewn end-to-side with  running 7-0 Prolene with good flow through the graft. The second distal  anastomosis was the diagonal branch to the LAD.  This was a 1.4 mm vessel  with proximal 90% stenosis.  A reverse saphenous vein was sewn end-to-side  with running 7-0 Prolene.  There was good flow through the graft. The third  distal anastomosis was to the distal circumflex. This was a 1.4 mm vessel with proximal 80% stenosis. A reverse saphenous vein was sewn  end-to-side to  this extremely thin-walled vessel with running 8-0 Prolene. There is good  flow through the anastomosis after redosing cardioplegia. The fourth distal  anastomosis was to the mid distal LAD. It was a 1.5 mm vessel with a  proximal 90% stenosis.  The left internal mammary artery pedicle was brought  through an opening created in the left lateral pericardium and was brought  down onto the LAD and sewn end-to-side with running 8-0 Prolene. There is  excellent flow through the anastomosis after briefly releasing the pedicle  bulldog on the mammary pedicle. The bulldog clamp was replaced and the  pedicle secured to the epicardium with two 6-0 Prolene sutures. Cardioplegia  was reduced.   While the crossclamp was still in place, from the initial application, three  proximal vein anastomoses were placed  on the ascending aorta using a 4.0 mm  punch and running 7-0 Prolene. Aorta was vented from the ascending aorta  prior to release of the crossclamp and prior to tying down the final  proximal anastomosis.   The crossclamp was removed and the heart resumed a spontaneous rhythm. The  coronary anastomoses were checked and found to be hemostatic. Temporary  pacing wires were applied. The patient was rewarmed to 37 degrees. The lungs  were re-expanded and ventilator was resumed. The patient was weaned from  bypass without difficulty on renal dose dopamine. Blood pressure and cardiac  output were stable. Protamine was administered without adverse reaction. The  patient had persistent coagulopathy and was given a unit of platelets with  improved coagulation function. The patient remained hemodynamically stable.  The leg incision was irrigated and closed in a standard fashion. The sternal  wound was irrigated and mediastinal and a left pleural chest tube were  placed and brought out through separate incisions. The superior pericardial  fat was closed over the aorta. The sternum was   closed with interrupted steel wire. The pectoralis fascia was closed with a  running #1 Vicryl. Subcutaneous and skin layers were closed with running  Vicryl and sterile dressings were applied. Total crossclamp time for the  proximal distal anastomoses was 118 minutes.      [name], M.D.  Electronically Signed     PV/MEDQ  D:  05/12/2005  T:  05/13/2005  Job:  [id number]   cc:   [facility] office   Madaline Savage, M.D.  Fax: [phone]

## 2010-10-30 NOTE — Cardiovascular Report (Signed)
[name], [name]            ACCOUNT NO.:  [id number]   MEDICAL RECORD NO.:  [id number]          PATIENT TYPE:  OIB   LOCATION:  2899                         FACILITY:  [facility]   PHYSICIAN:  Darlin Priestly, MD  DATE OF BIRTH:  1940-11-02   DATE OF PROCEDURE:  12/20/2005  DATE OF DISCHARGE:                              CARDIAC CATHETERIZATION   PROCEDURES:  1.  Left heart catheterization.  2.  Coronary angiography.  3.  Left ventriculogram.  4.  Saphenous vein graft angiography.  5.  Left internal mammary angiography.  6.  Ascending arteriography.   COMPLICATIONS:  None.   INDICATIONS:  [name] is a 70 year old male patient of Dr. Nilda Simmer  with a history of CAD status post bypass surgery in November of 2006,  consisting of a limited LAD, vein graft RCA, vein graft circumflex, and vein  graft diagonal.  He did develop postoperative atrial fibrillation and this  has subsequently resolved.  He is now off his Coumadin as well as  amiodarone.  He is known to have significant right internal carotid artery  stenosis, followed by Dr. Woodfin Ganja.  He did recently undergo a Cardiolite  scan on November 16, 2005, suggesting inferolateral wall ischemia, is now  referred for repeat catheterization to reassess his grafts prior to his  vascular surgery.   DESCRIPTION OF PROCEDURE:  After obtaining informed consent, the patient was  brought to the cardiac catheterization lab where his groin was shaved,  prepped, and draped in the usual sterile fashion.  ECG monitoring was  established.  Using modified Seldinger technique, a 6-French arterial  sheath was inserted in the right femoral artery.  A 6-French diagnostic  catheter then used to perform diagnostic angiography.   The left main is a medium-size vessel with diffused 60% disease.  There is a  pressure damping when engaged.   The LAD is a large vessel that coarse around the apex.  There is one  diagonal branch.  There is  diffuse 99% proximal disease at the takeoff of  the first diagonal.  There is also a 50% lesion at the distal LAD.   There is a patent LIMA insertion to the distal mid portion of the LAD which  appears to be widely patent.  There is mild 50% narrowing of the LAD beyond  the insertion.   There is a patent vein graft to the first diagonal which has no significant  disease in the graft and distal graft insertion.  There is left-to-left  collaterals to one bifurcating high OM which is noted to have diffuse 90-99%  disease.   The left circumflex main stem vessel which course to the apex with two  obtuse marginal branches.  The AV groove circumflex has no significant  disease.   The first OM is a medium-sized vessel that bifurcates in its early segment.  There is diffuse 90-99% stenosis throughout the proximal segment of both the  upper and lower branch.   The second OM fills in antegrade fashion.  There is a patent vein graft  which inserts in the second OM.  The vein  graft itself has diffuse 60%  disease scattered up and down the graft with several aneurysmal segments of  the proximal, mid, and distal area.  There is TIMI III flow in the graft and  distal graft insertion.   The right coronary artery is a medium vessel which give rise to the  posterolateral branch.  There is 70% proximal stenosis with pressure  dampening.  There is 80% mid distal RCA stenosis.  The PA and posterolateral  branches have no significant disease.   The vein graft to the RCA is totally occluded at its ostium.   Left ventriculogram reveals self-preserved EF of 60%.   Ascending arteriography reveals no aortic aneurysm in the ascending root.  There is no visualization of the vein graft to the RCA.   Hemodynamics: Systolic arterial pressure 120/58, LV system pressure 122/1,  LVP of 11.   CONCLUSION:  1.  Significant left main and 3-vessel coronary artery disease.  2.  Patent lumen to left anterior  descending with 50% lesion in the left      anterior descending beyond the OM insertion.  3.  Patent vein graft to the diagonal.  4.  Patent but diffusely diseased vein graft to the obtuse marginal.  5.  Total occluded vein graft to the right coronary artery.  6.  Normal left ventricular systolic function.  7.  No evidence of ascending aortic dissection or dilatation.      Darlin Priestly, MD  Electronically Signed     RHM/MEDQ  D:  12/20/2005  T:  12/20/2005  Job:  [id number]   cc:   Nilda Simmer, M.D.  Fax: [phone]

## 2010-10-30 NOTE — Discharge Summary (Signed)
NAMEFLORENTINO, [name]            ACCOUNT NO.:  [id number]   MEDICAL RECORD NO.:  [id number]          PATIENT TYPE:  INP   LOCATION:  3710                         FACILITY:  [facility]   PHYSICIAN:  Thereasa Solo. Little, M.D. DATE OF BIRTH:  1941-04-18   DATE OF ADMISSION:  05/20/2005  DATE OF DISCHARGE:  05/24/2005                                 DISCHARGE SUMMARY   HOSPITAL COURSE:  [name] was admitted after being discharged on May 18, 2005, status post coronary artery bypass grafting.  He came in with  complaints about weakness and tachy-palpitations.  He was found to be in and  out of atrial fibrillation.  It was decided to admit him and to put him on  amiodarone.  He continued to go in and out of atrial fibrillation and it was  recommended that he be on Coumadin anticoagulation and this was started.  He  was kept in the hospital until his INR was therapeutic.  Initially, the  patient's wife did not feel that they could give themselves Lovenox  injections.  The case manager then arranged that he would have home health  nursing come to his house and provide Lovenox, thus he was discharged home  on Lovenox-to-Coumadin therapy.   LABORATORY DATA:  Hemoglobin 10.6, hematocrit 29.9, platelets 525,000, WBC  10.  Sodium 137, potassium 4.0, glucose 106, BUN 12, creatinine 1.0, albumin  3.3.  TSH 2.097.  AST was 34; ALT was 36.   DISCHARGE MEDICATIONS:  1.  Aspirin 81 mg once per day.  2.  Coumadin 5 mg once per day.  3.  Amiodarone 500 mg once per day x2 weeks, then 200 mg a day.  4.  Digoxin 0.25 mg once per day.  5.  Zocor 20 mg at bedtime.  6.  Multivitamin every day.  7.  Lovenox 70 mg every 12 hours.   DISCHARGE DIAGNOSES:  1.  Paroxysmal atrial fibrillation.  2.  Status post coronary artery bypass grafting x4 on May 12, 2005.  3.  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  4.  Hyperlipidemia.  5.  Normal left ventricular  function.   DICTATION ENDED AT THIS POINT.      Lezlie Octave, N.P.    ______________________________  Thereasa Solo. Little, M.D.    BB/MEDQ  D:  07/29/2005  T:  07/30/2005  Job:  [id number]

## 2010-10-30 NOTE — Discharge Summary (Signed)
NAMEFLORENCE, ANTONELLI            ACCOUNT NO.:  [id number]   MEDICAL RECORD NO.:  [id number]          PATIENT TYPE:  INP   LOCATION:  2025                         FACILITY:  [facility]   PHYSICIAN:  Kerin Perna, M.D.  DATE OF BIRTH:  05/30/1941   DATE OF ADMISSION:  05/09/2005  DATE OF DISCHARGE:                                 DISCHARGE SUMMARY   ADMISSION DIAGNOSIS:  Chest pain.   PAST MEDICAL HISTORY AND DISCHARGE DIAGNOSES:  1.  Hypercholesterolemia.  2.  Status post lung biopsy in 1989 on the left.  Biopsy was benign.  3.  Repair of rectal fissure.  4.  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  5.  A 60 to 80% right carotid artery stenosis, status post vascular surgery      evaluation with recommendation of follow-up in six months with repeat      carotid Duplex.   BRIEF HISTORY:  The patient is a 70 year old Caucasian male who presented  with vague chest pain over several weeks prior to admission.  He was  admitted initially as an outpatient by Dr. Jenne Campus who recommended that the  patient follow up with an elective cardiac catheterization.  The patient's  chest pain presented with epigastric pain and discomfort in the left arm.  Secondary to positive stress test, the patient was admitted for elective  cardiac catheterization.   HOSPITAL COURSE:  The patient was admitted and taken for elective cardiac  catheterization on May 10, 2005, by Dr. Elsie Lincoln.  This revealed  multivessel coronary artery disease and therefore, Dr. Donata Clay of the CVTS  service was consulted regarding surgical revascularization.  Dr. Donata Clay  evaluated the patient on May 10, 2005, and it was his opinion that the  patient should proceed with coronary artery bypass graft surgery.  As part  of preoperative work-up, the patient underwent carotid Duplex scan which  revealed a 60 to 80% right internal carotid artery stenosis.  Secondary to  this  finding, Dr. Edilia Bo of the vascular service was consulted regarding  possible need for carotid endarterectomy prior to proceeding with CABG.  Dr.  Edilia Bo evaluated the patient on May 11, 2005, and it was his opinion  that the patient's 60 to 80% asymptomatic right internal carotid artery  stenosis should be followed up in six months with a repeat carotid Duplex  scan.  He also stated that he did not believe that proceeding with carotid  endarterectomy prior to CABG would reduce perioperative stroke during CABG.   The patient was taken to the OR on May 12, 2005, for coronary artery  bypass grafting x4.  Endoscopic vessel harvesting was performed on the  bilateral thighs and the right lower extremity.  The left internal mammary  artery was grafted to the LAD, saphenous vein was grafted to the right  coronary artery, saphenous vein was grafted to the circumflex and saphenous  vein was grafted to the diagonal.  Patient tolerated the procedure well and  was hemodynamically stable immediately postoperatively.  Patient was  transferred from the OR to  the SICU in stable condition.  The patient was  extubated without complications and woke up from anesthesia neurologically  intact.   Patient's postoperative course has progressed as expected.  On postoperative  day #1, he was afebrile with stable vital signs and maintaining a normal  sinus rhythm.  All invasive lines and chest tubes were discontinued in a  routine manner.  Patient has been volume overloaded postoperatively and has  been diuresed accordingly.  He began cardiac rehab on postoperative day #1  and has increased his tolerance to a satisfactory level at this time.   Patient was transferred without complications from he SICU to unit 2000 on  postoperative day #2.  On postoperative day #4, the patient was without  complaint.  He has been ambulating well and his bowel function has returned.  Patient was afebrile with stable  vital signs and maintaining normal sinus  rhythm.  On physical examination cardiac is regular rate and rhythm, lung  are low crackles and decreased breath sounds in the bilateral bases.  The  abdomen is benign.  The incisions are clean, dry and intact and there is  trace edema present in bilateral lower extremities.  The patient is in  stable condition at this time and as long as he continues to progress in the  current manner, he will be ready for discharge in the next one to two days  pending morning round reevaluation.   LABORATORY DATA:  CBC on May 15, 2005, hemoglobin 10.3, hematocrit  29.3, white count 13.4, platelets 209.  BMP on May 15, 2005, sodium  136, potassium 4.1, BUN 12, creatinine 0.9, glucose 90.   CONDITION ON DISCHARGE:  Improved.   DISCHARGE INSTRUCTIONS:  1.  Medications:      1.  Aspirin 325 mg p.o. daily.      2.  Toprol XL 25 mg daily.      3.  Altace 2.5 mg daily.      4.  Zocor 20 mg daily.      5.  Lasix 40 mg daily x3 days.      6.  K-Dur 20 mEq daily x3 days.      7.  Tylox one to two q.4-6h. p.r.n. pain.  2.  Activity:  The patient is to refrain from driving and lifting anything      more than 10 pounds.  He should continue daily breathing and walking      exercises.  3.  Diet:  Low fat, low salt diet.  4.  Wound care:  The patient may shower daily and clean the incisions with      soap and water.  If wound problems arise,  he should contact the CVTS      office.  5.  Follow-up appointments:      1.  Dr. Elsie Lincoln.  Patient will be instructed to contact his office for an          appointment two weeks after discharge.      2.  Dr. Donata Clay three weeks after discharge.  The CVTS office will          contact the patient with date and time of that appointment.  He will          also be instructed to go to [facility] for a chest x-ray          which will be brought to the appointment with Dr. Donata Clay.     3.  Dr.  Edilia Bo. CVTS office  will arrange an appointment six months          after discharge for a repeat follow-up carotid Duplex.      Pecola Leisure, [place]      Kerin Perna, M.D.  Electronically Signed    AY/MEDQ  D:  05/16/2005  T:  05/17/2005  Job:  [id number]   cc:   Madaline Savage, M.D.  Fax: [phone]

## 2011-03-01 ENCOUNTER — Emergency Department (HOSPITAL_COMMUNITY)
Admission: EM | Admit: 2011-03-01 | Discharge: 2011-03-01 | Discharge status: Against Medical Advice | Payer: Medicare Other | Attending: Emergency Medicine | Admitting: Emergency Medicine

## 2011-03-10 LAB — COMPREHENSIVE METABOLIC PANEL
ALT: 20
BUN: 18
CO2: 26
Calcium: 8.9
Creatinine, Ser: 2.14 — ABNORMAL HIGH
GFR calc non Af Amer: 31 — ABNORMAL LOW
Glucose, Bld: 128 — ABNORMAL HIGH
Sodium: 137
Total Protein: 6.1

## 2011-03-10 LAB — CBC
HCT: 35.8 — ABNORMAL LOW
Hemoglobin: 12.2 — ABNORMAL LOW
MCHC: 34.2
MCV: 88.6
RBC: 4.04 — ABNORMAL LOW
RDW: 13.1

## 2011-03-10 LAB — CROSSMATCH: Antibody Screen: NEGATIVE

## 2011-03-10 LAB — APTT: aPTT: 30

## 2011-03-10 LAB — DIFFERENTIAL
Eosinophils Absolute: 0.2
Lymphs Abs: 2
Neutro Abs: 4.3
Neutrophils Relative %: 60

## 2011-03-10 LAB — PROTIME-INR
INR: 1
Prothrombin Time: 13.8

## 2011-03-11 LAB — CBC
HCT: 36.4 — ABNORMAL LOW
MCV: 88
Platelets: 193
Platelets: 215
RDW: 13.3
RDW: 13.9

## 2011-03-15 LAB — CBC
HCT: 23.5 — ABNORMAL LOW
HCT: 24.1 — ABNORMAL LOW
Hemoglobin: 7.8 — CL
Hemoglobin: 8.1 — ABNORMAL LOW
Hemoglobin: 8.3 — ABNORMAL LOW
MCHC: 33.8
MCHC: 34.3
MCHC: 34.4
MCHC: 34.6
MCHC: 34.9
MCHC: 35.5
MCV: 102.3 — ABNORMAL HIGH
MCV: 103.6 — ABNORMAL HIGH
MCV: 105.2 — ABNORMAL HIGH
Platelets: 209
RBC: 2.16 — ABNORMAL LOW
RBC: 2.72 — ABNORMAL LOW
RDW: 20.6 — ABNORMAL HIGH
RDW: 21.9 — ABNORMAL HIGH
RDW: 22 — ABNORMAL HIGH
WBC: 1.3 — CL
WBC: 2.1 — ABNORMAL LOW

## 2011-03-15 LAB — URINALYSIS, ROUTINE W REFLEX MICROSCOPIC
Ketones, ur: NEGATIVE
Leukocytes, UA: NEGATIVE
Nitrite: NEGATIVE
Protein, ur: NEGATIVE
Urobilinogen, UA: 0.2
pH: 5.5

## 2011-03-15 LAB — COMPREHENSIVE METABOLIC PANEL
AST: 39 — ABNORMAL HIGH
Albumin: 1.7 — ABNORMAL LOW
Alkaline Phosphatase: 39
BUN: 14
Calcium: 7.4 — ABNORMAL LOW
Calcium: 7.7 — ABNORMAL LOW
Creatinine, Ser: 1.26
GFR calc Af Amer: >60
GFR calc non Af Amer: 57 — ABNORMAL LOW
Glucose, Bld: 96
Potassium: 3.9
Total Protein: 3.8 — ABNORMAL LOW

## 2011-03-15 LAB — DIFFERENTIAL
Basophils Absolute: 0
Eosinophils Relative: 0
Lymphocytes Relative: 6 — ABNORMAL LOW
Lymphs Abs: 0.1 — ABNORMAL LOW
Lymphs Abs: 0.1 — ABNORMAL LOW
Monocytes Absolute: 0 — ABNORMAL LOW
Monocytes Relative: 3
Neutro Abs: 1.4 — ABNORMAL LOW

## 2011-03-15 LAB — BLOOD GAS, ARTERIAL
Drawn by: 295031
pCO2 arterial: 30.1 — ABNORMAL LOW
pH, Arterial: 7.526 — ABNORMAL HIGH

## 2011-03-15 LAB — BASIC METABOLIC PANEL
BUN: 18
CO2: 22
CO2: 22
CO2: 23
CO2: 25
Calcium: 7.5 — ABNORMAL LOW
Calcium: 7.5 — ABNORMAL LOW
Calcium: 7.6 — ABNORMAL LOW
Calcium: 8.2 — ABNORMAL LOW
Chloride: 108
Chloride: 110
Creatinine, Ser: 1.36
Creatinine, Ser: 1.51 — ABNORMAL HIGH
GFR calc Af Amer: >60
GFR calc Af Amer: >60
GFR calc non Af Amer: 52 — ABNORMAL LOW
GFR calc non Af Amer: >60
Glucose, Bld: 112 — ABNORMAL HIGH
Glucose, Bld: 129 — ABNORMAL HIGH
Glucose, Bld: 97
Potassium: 3.8
Sodium: 136
Sodium: 138

## 2011-03-15 LAB — CULTURE, BLOOD (ROUTINE X 2): Culture: NO GROWTH

## 2011-03-15 LAB — CROSSMATCH

## 2011-03-15 LAB — CLOSTRIDIUM DIFFICILE EIA

## 2011-03-15 LAB — CREATININE, URINE, RANDOM: Creatinine, Urine: 14.7

## 2011-03-15 LAB — MAGNESIUM: Magnesium: 2.1

## 2011-03-15 LAB — D-DIMER, QUANTITATIVE: D-Dimer, Quant: 2.89 — ABNORMAL HIGH

## 2011-03-16 LAB — LIPID PANEL
LDL Cholesterol: 113 mg/dL
Triglycerides: 121 mg/dL (ref 40–160)

## 2011-04-19 ENCOUNTER — Telehealth: Payer: Self-pay | Admitting: Pulmonary Disease

## 2011-04-19 DIAGNOSIS — R911 Solitary pulmonary nodule: Secondary | ICD-10-CM

## 2011-04-19 NOTE — Telephone Encounter (Signed)
Order placed in [facility] reads as follows: noncontrast ct chest middle of NOV to f/u RML nodules. Pt also due for rov after ct. Order placed. Pt is aware. Carron Curie, CMA

## 2011-04-30 ENCOUNTER — Ambulatory Visit (INDEPENDENT_AMBULATORY_CARE_PROVIDER_SITE_OTHER)
Admission: RE | Admit: 2011-04-30 | Discharge: 2011-04-30 | Disposition: A | Discharge status: Home / Self Care | Payer: Medicare Other | Source: Ambulatory Visit | Attending: Pulmonary Disease | Admitting: Pulmonary Disease

## 2011-04-30 DIAGNOSIS — R911 Solitary pulmonary nodule: Secondary | ICD-10-CM

## 2011-04-30 DIAGNOSIS — J984 Other disorders of lung: Secondary | ICD-10-CM

## 2011-05-12 ENCOUNTER — Encounter: Payer: Self-pay | Admitting: Pulmonary Disease

## 2011-05-13 ENCOUNTER — Ambulatory Visit: Payer: Medicare Other | Admitting: Pulmonary Disease

## 2011-05-27 ENCOUNTER — Encounter: Payer: Self-pay | Admitting: Pulmonary Disease

## 2011-05-27 ENCOUNTER — Ambulatory Visit (INDEPENDENT_AMBULATORY_CARE_PROVIDER_SITE_OTHER): Payer: Medicare Other | Admitting: Pulmonary Disease

## 2011-05-27 VITALS — BP 110/62 | HR 53 | Temp 97.7°F | Ht 69.0 in | Wt 161.0 lb

## 2011-05-27 DIAGNOSIS — R930 Abnormal findings on diagnostic imaging of skull and head, not elsewhere classified: Secondary | ICD-10-CM

## 2011-05-27 NOTE — Patient Instructions (Signed)
No further followup ct scans needed.  followup with me as needed.

## 2011-05-27 NOTE — Progress Notes (Signed)
  Subjective:    Patient ID: [name], male    DOB: July 21, 1940, 70 y.o.   MRN: [id number]  HPI The patient comes in today for followup of his pulmonary nodules.  He has had a recent CT chest which showed resolution of areas of prior concern, and no new findings.  He does have some calcifications suggestive of old granulomatous disease.  Patient has new symptoms of concern.   Review of Systems  Constitutional: Negative for fever and unexpected weight change.  HENT: Negative for ear pain, nosebleeds, congestion, sore throat, rhinorrhea, sneezing, trouble swallowing, dental problem, postnasal drip and sinus pressure.   Eyes: Negative for redness and itching.  Respiratory: Negative for cough, chest tightness, shortness of breath and wheezing.   Cardiovascular: Negative for palpitations and leg swelling.  Gastrointestinal: Negative for nausea and vomiting.  Genitourinary: Negative for dysuria.  Musculoskeletal: Negative for joint swelling.  Skin: Negative for rash.  Neurological: Negative for headaches.  Hematological: Does not bruise/bleed easily.  Psychiatric/Behavioral: Negative for dysphoric mood. The patient is not nervous/anxious.        Objective:   Physical Exam Well-developed male in no acute distress Lower extremities without edema, cyanosis noted Alert and oriented, moves all 4 extremities.       Assessment & Plan:

## 2011-05-27 NOTE — Assessment & Plan Note (Addendum)
The patient's most recent CT chest showed resolution of prior areas of concern, and only persistent calcified densities most consistent with old granulomatous disease.  I have reviewed his ct with him in detail, and have given him a copy of the report.  The patient does not need further CT evaluation at this time.  He will followup with me on an as needed basis.

## 2011-07-12 DIAGNOSIS — E782 Mixed hyperlipidemia: Secondary | ICD-10-CM | POA: Diagnosis not present

## 2011-07-12 DIAGNOSIS — Z79899 Other long term (current) drug therapy: Secondary | ICD-10-CM | POA: Diagnosis not present

## 2011-07-27 DIAGNOSIS — N2581 Secondary hyperparathyroidism of renal origin: Secondary | ICD-10-CM | POA: Diagnosis not present

## 2011-07-27 DIAGNOSIS — I10 Essential (primary) hypertension: Secondary | ICD-10-CM | POA: Diagnosis not present

## 2011-07-27 DIAGNOSIS — D509 Iron deficiency anemia, unspecified: Secondary | ICD-10-CM | POA: Diagnosis not present

## 2011-08-04 DIAGNOSIS — I129 Hypertensive chronic kidney disease with stage 1 through stage 4 chronic kidney disease, or unspecified chronic kidney disease: Secondary | ICD-10-CM | POA: Diagnosis not present

## 2011-08-04 DIAGNOSIS — N2581 Secondary hyperparathyroidism of renal origin: Secondary | ICD-10-CM | POA: Diagnosis not present

## 2011-08-04 DIAGNOSIS — N039 Chronic nephritic syndrome with unspecified morphologic changes: Secondary | ICD-10-CM | POA: Diagnosis not present

## 2011-09-17 DIAGNOSIS — I2510 Atherosclerotic heart disease of native coronary artery without angina pectoris: Secondary | ICD-10-CM | POA: Diagnosis not present

## 2011-09-17 DIAGNOSIS — E782 Mixed hyperlipidemia: Secondary | ICD-10-CM | POA: Diagnosis not present

## 2011-09-17 DIAGNOSIS — I739 Peripheral vascular disease, unspecified: Secondary | ICD-10-CM | POA: Diagnosis not present

## 2011-09-20 DIAGNOSIS — J0110 Acute frontal sinusitis, unspecified: Secondary | ICD-10-CM | POA: Diagnosis not present

## 2011-09-20 DIAGNOSIS — I10 Essential (primary) hypertension: Secondary | ICD-10-CM | POA: Diagnosis not present

## 2011-09-20 DIAGNOSIS — N059 Unspecified nephritic syndrome with unspecified morphologic changes: Secondary | ICD-10-CM | POA: Diagnosis not present

## 2011-09-20 DIAGNOSIS — I739 Peripheral vascular disease, unspecified: Secondary | ICD-10-CM | POA: Diagnosis not present

## 2011-10-05 DIAGNOSIS — J019 Acute sinusitis, unspecified: Secondary | ICD-10-CM | POA: Diagnosis not present

## 2011-10-15 DIAGNOSIS — J019 Acute sinusitis, unspecified: Secondary | ICD-10-CM | POA: Diagnosis not present

## 2011-10-19 ENCOUNTER — Ambulatory Visit (INDEPENDENT_AMBULATORY_CARE_PROVIDER_SITE_OTHER): Payer: Medicare Other | Admitting: Family Medicine

## 2011-10-19 DIAGNOSIS — J329 Chronic sinusitis, unspecified: Secondary | ICD-10-CM | POA: Diagnosis not present

## 2011-10-19 DIAGNOSIS — R079 Chest pain, unspecified: Secondary | ICD-10-CM

## 2011-10-19 DIAGNOSIS — I6529 Occlusion and stenosis of unspecified carotid artery: Secondary | ICD-10-CM

## 2011-10-19 DIAGNOSIS — I2510 Atherosclerotic heart disease of native coronary artery without angina pectoris: Secondary | ICD-10-CM

## 2011-10-19 DIAGNOSIS — J8410 Pulmonary fibrosis, unspecified: Secondary | ICD-10-CM

## 2011-10-19 DIAGNOSIS — N059 Unspecified nephritic syndrome with unspecified morphologic changes: Secondary | ICD-10-CM

## 2011-10-19 LAB — POCT CBC
Hemoglobin: 14.9 g/dL (ref 14.1–18.1)
Lymph, poc: 1.7 (ref 0.6–3.4)
MCHC: 32.9 g/dL (ref 31.8–35.4)
MID (cbc): 0.4 (ref 0–0.9)
MPV: 8.8 fL (ref 0–99.8)
POC Granulocyte: 3.3 (ref 2–6.9)
POC MID %: 7.4 %M (ref 0–12)
Platelet Count, POC: 230 10*3/uL (ref 142–424)
RBC: 5.13 M/uL (ref 4.69–6.13)

## 2011-10-20 NOTE — Progress Notes (Signed)
  Subjective:    Patient ID: ERYC BODEY, male    DOB: 10-22-1940, 71 y.o.   MRN: [id number]  HPI  Patient presents to clinic with complaints of lower chest/upper abdominal pain that patient feels  is positional in nature.  Denies radiation of pain or associated symptoms to include N/V, SOB or diophoresis. Known CAD/CABG  Walked 30 minutes today without change in symptoms  Recent sinusitis; does have more pain with deep inspiration  Denies GERD symptoms(Nexium)  OV to Dr. Allyson Sabal last week and no concerns identified Cardiac catherization last year that showed small lesion in 1 graft.  Patient wanted evaluation of symptoms for reassurance as he is leaving town next week  Review of Systems     Objective:   Physical Exam  Constitutional: He appears well-developed.  Neck: Neck supple. No thyromegaly present.  Cardiovascular: Normal rate, regular rhythm, normal heart sounds and intact distal pulses.        No edema  Pulmonary/Chest: Effort normal and breath sounds normal.  Abdominal: Soft.       No bruits  Neurological: He is alert.  Skin: Skin is warm.       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 Allyson Sabal if he would like to see patient in follow up Patient to contact me if symptoms change in nature

## 2011-10-21 LAB — COMPREHENSIVE METABOLIC PANEL
ALT: 25 U/L (ref 0–53)
AST: 34 U/L (ref 0–37)
CO2: 28 mEq/L (ref 19–32)
Creat: 1.82 mg/dL — ABNORMAL HIGH (ref 0.50–1.35)
Sodium: 139 mEq/L (ref 135–145)
Total Bilirubin: 0.4 mg/dL (ref 0.3–1.2)
Total Protein: 6.4 g/dL (ref 6.0–8.3)

## 2011-10-27 ENCOUNTER — Encounter: Payer: Self-pay | Admitting: Radiology

## 2011-12-06 ENCOUNTER — Other Ambulatory Visit: Payer: Self-pay | Admitting: Dermatology

## 2011-12-06 DIAGNOSIS — Z85828 Personal history of other malignant neoplasm of skin: Secondary | ICD-10-CM | POA: Diagnosis not present

## 2011-12-06 DIAGNOSIS — C44529 Squamous cell carcinoma of skin of other part of trunk: Secondary | ICD-10-CM | POA: Diagnosis not present

## 2011-12-06 DIAGNOSIS — D239 Other benign neoplasm of skin, unspecified: Secondary | ICD-10-CM | POA: Diagnosis not present

## 2011-12-06 DIAGNOSIS — L820 Inflamed seborrheic keratosis: Secondary | ICD-10-CM | POA: Diagnosis not present

## 2011-12-06 DIAGNOSIS — D045 Carcinoma in situ of skin of trunk: Secondary | ICD-10-CM | POA: Diagnosis not present

## 2012-01-28 DIAGNOSIS — J329 Chronic sinusitis, unspecified: Secondary | ICD-10-CM | POA: Diagnosis not present

## 2012-01-28 DIAGNOSIS — H6120 Impacted cerumen, unspecified ear: Secondary | ICD-10-CM | POA: Diagnosis not present

## 2012-02-08 DIAGNOSIS — N2581 Secondary hyperparathyroidism of renal origin: Secondary | ICD-10-CM | POA: Diagnosis not present

## 2012-02-08 DIAGNOSIS — D509 Iron deficiency anemia, unspecified: Secondary | ICD-10-CM | POA: Diagnosis not present

## 2012-02-08 DIAGNOSIS — I10 Essential (primary) hypertension: Secondary | ICD-10-CM | POA: Diagnosis not present

## 2012-03-05 ENCOUNTER — Encounter: Payer: Self-pay | Admitting: *Deleted

## 2012-03-06 ENCOUNTER — Encounter: Payer: Self-pay | Admitting: Family Medicine

## 2012-03-06 ENCOUNTER — Ambulatory Visit (INDEPENDENT_AMBULATORY_CARE_PROVIDER_SITE_OTHER): Payer: Medicare Other | Admitting: Family Medicine

## 2012-03-06 VITALS — BP 150/80 | HR 50 | Temp 97.9°F | Resp 16 | Ht 67.0 in | Wt 153.0 lb

## 2012-03-06 DIAGNOSIS — Z1211 Encounter for screening for malignant neoplasm of colon: Secondary | ICD-10-CM | POA: Diagnosis not present

## 2012-03-06 DIAGNOSIS — A6000 Herpesviral infection of urogenital system, unspecified: Secondary | ICD-10-CM

## 2012-03-06 DIAGNOSIS — N059 Unspecified nephritic syndrome with unspecified morphologic changes: Secondary | ICD-10-CM

## 2012-03-06 DIAGNOSIS — E7800 Pure hypercholesterolemia, unspecified: Secondary | ICD-10-CM

## 2012-03-06 DIAGNOSIS — Z0000 Encounter for general adult medical examination without abnormal findings: Secondary | ICD-10-CM | POA: Diagnosis not present

## 2012-03-06 DIAGNOSIS — Z125 Encounter for screening for malignant neoplasm of prostate: Secondary | ICD-10-CM

## 2012-03-06 DIAGNOSIS — I10 Essential (primary) hypertension: Secondary | ICD-10-CM

## 2012-03-06 DIAGNOSIS — E782 Mixed hyperlipidemia: Secondary | ICD-10-CM

## 2012-03-06 LAB — CBC WITH DIFFERENTIAL/PLATELET
Basophils Absolute: 0.1 10*3/uL (ref 0.0–0.1)
Eosinophils Relative: 2 % (ref 0–5)
HCT: 48.5 % (ref 39.0–52.0)
Hemoglobin: 16.2 g/dL (ref 13.0–17.0)
Lymphocytes Relative: 23 % (ref 12–46)
Lymphs Abs: 1 10*3/uL (ref 0.7–4.0)
MCV: 85.7 fL (ref 78.0–100.0)
Monocytes Absolute: 0.6 10*3/uL (ref 0.1–1.0)
Monocytes Relative: 13 % — ABNORMAL HIGH (ref 3–12)
Neutro Abs: 2.7 10*3/uL (ref 1.7–7.7)
RBC: 5.66 MIL/uL (ref 4.22–5.81)
RDW: 14.2 % (ref 11.5–15.5)
WBC: 4.5 10*3/uL (ref 4.0–10.5)

## 2012-03-06 LAB — POCT URINALYSIS DIPSTICK
Glucose, UA: NEGATIVE
Nitrite, UA: NEGATIVE
Protein, UA: NEGATIVE
Spec Grav, UA: 1.015
Urobilinogen, UA: 0.2

## 2012-03-06 LAB — POCT UA - MICROSCOPIC ONLY
WBC, Ur, HPF, POC: NEGATIVE
Yeast, UA: NEGATIVE

## 2012-03-06 LAB — IFOBT (OCCULT BLOOD): IFOBT: NEGATIVE

## 2012-03-06 MED ORDER — ACYCLOVIR 400 MG PO TABS: ORAL_TABLET | ORAL | Status: DC

## 2012-03-06 NOTE — Patient Instructions (Signed)
1. Mixed hyperlipidemia  Lipid panel  2. 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 Essential hypertension, benign  CBC with Differential, Comprehensive metabolic panel  4. Special screening for malignant neoplasm of prostate  PSA, Medicare  5. Screening for colon cancer  IFOBT POC (occult bld, rslt in office)

## 2012-03-06 NOTE — Progress Notes (Signed)
[address]   [place], [place]  [address]   [phone]  Subjective:    Patient ID: [name], male    DOB: June 22, 1940, 71 y.o.   MRN: [id number]  HPI67 year-old Caucasian male presenting for Annual Wellness Examination.  Last physical exam 2012.  Colonoscopy not recently.  Pneumovax never.  Zostavax never; mild shingles 2001.  No flu vaccine.  Dental exam this week.  Eye exam this year; no glaucoma or cataracts.   1.  Anterior chest skin lesion: s/p resection by dermatology; +cancerous basal cell carcinoma.   Follow-up in upcoming three months.  2.  Sinus drainage L: onset of sinus drainage, bloody drainage; L sided headache also developed; also developed odor foul.  Intermittent odor.  Evaluated by Jearld Fenton; prescribed Augmentin; did not complete rx for Augmentin because rhinorrhea, headaches resolved.  Symptoms recurred, so [facility] represcribed Augmentin with resolution.  Mentioned odor.  Has dental appointment coming up this week.  Will notice a bit a drainage from L upper tooth; concerned odor may be coming from tooth.  Thinks odor is coming from nose.  No further congstion; scant intermittent drainage.  Smells odor 1-2 times per week; each episode is present for 1-2 hours.  Jearld Fenton mentioned that may warrant MRI to evaluate for fistula between sinus and teeth.   3.  RLE skin splotch: developed last week; dissipating; etiology unknown; no itching.  4.  Glomerulonephritis:  S/p follow-up with nephrology on 02/08/12; Creatinine 1.93.  Concerned regarding elevation.    5. PAD:  S/p sonogram of legs.  6.  Carotid Stenosis:  S/p sonogram.  7.  Genital Herpes: needs refill of Acyclovir; no recent outbreaks.  8. CAD: stable; exercising regularly; no chest pain,palpitations, SOB, leg swelling.  Regular follow-up with cardiology.  7. Hyperlipidemia:  Due for repeat cholesterol panel.  Now on generic for Trilipix; if levels elevated, will need to return to name brand.    Review of Systems    Constitutional: Negative for fever, chills, diaphoresis, activity change, appetite change, fatigue and unexpected weight change.  HENT: Positive for hearing loss, congestion, dental problem and sinus pressure. Negative for ear pain, nosebleeds, sore throat, facial swelling, rhinorrhea, sneezing, drooling, mouth sores, trouble swallowing, neck pain, neck stiffness, voice change, postnasal drip and ear discharge.   Eyes: Negative for photophobia, pain, discharge, redness, itching and visual disturbance.  Respiratory: Negative for apnea, cough, choking, chest tightness, shortness of breath, wheezing and stridor.   Cardiovascular: Negative for chest pain, palpitations and leg swelling.  Gastrointestinal: Negative for nausea, vomiting, abdominal pain, diarrhea, constipation, blood in stool, abdominal distention, anal bleeding and rectal pain.  Genitourinary: Negative for dysuria, urgency, frequency, hematuria, flank pain, decreased urine volume, discharge, penile swelling, scrotal swelling, genital sores, penile pain and testicular pain.  Musculoskeletal: Negative for myalgias, back pain, joint swelling, arthralgias and gait problem.  Skin: Positive for rash. Negative for color change and pallor.  Neurological: Positive for headaches. Negative for dizziness, tremors, seizures, syncope, facial asymmetry, speech difficulty, weakness, light-headedness and numbness.  Hematological: Negative for adenopathy. Does not bruise/bleed easily.  Psychiatric/Behavioral: Negative for hallucinations, behavioral problems, confusion, disturbed wake/sleep cycle, self-injury, dysphoric mood, decreased concentration and agitation. The patient is not nervous/anxious and is not hyperactive.         Past Medical History  Diagnosis Date  . CAD (coronary artery disease)   . Chronic renal insufficiency     glomerulonephritis 2130865.7  . Anemia, unspecified   . CVD (cardiovascular disease)   .  Lung mass   .  Cerebrovascular disease, unspecified   . Benign paroxysmal positional vertigo   . Esophageal reflux   . Peripheral vascular disease, unspecified   . Pure hypercholesterolemia   . Essential hypertension, benign   . Coronary atherosclerosis of unspecified type of bypass graft   . Genital herpes, unspecified   . Carcinoma in situ of skin, site unspecified   . Congenital anomaly of tongue, unspecified     Past Surgical History  Procedure Date  . Heart bypass Nov 2006  . Lung biopsy 1989    left  . Carotid endarterectomy 2007    Right  . Cholecystectomy 07/2010    Prior to Admission medications   Medication Sig Start Date End Date Taking? Authorizing Provider  aspirin 81 MG tablet Take 81 mg by mouth daily.     Yes Historical Provider, MD  Choline Fenofibrate (TRILIPIX) 45 MG capsule Take 45 mg by mouth daily.     Yes Historical Provider, MD  esomeprazole (NEXIUM) 40 MG capsule Once a week as needed   Yes Historical Provider, MD  ezetimibe (ZETIA) 10 MG tablet Take 10 mg by mouth daily.   Yes Historical Provider, MD  Furosemide (LASIX PO) Take 10 mg by mouth daily.     Yes Historical Provider, MD  metoprolol tartrate (LOPRESSOR) 25 MG tablet Take 25 mg by mouth daily.     Yes Historical Provider, MD  nitroGLYCERIN (NITROSTAT) 0.4 MG SL tablet Place 0.4 mg under the tongue every 5 (five) minutes as needed.   Yes Historical Provider, MD  simvastatin (ZOCOR) 80 MG tablet Take 80 mg by mouth at bedtime.     Yes Historical Provider, MD  Casanthranol-Docusate Sodium 30-100 MG CAPS Take by mouth.    Historical Provider, MD  cyanocobalamin 100 MCG tablet Take 100 mcg by mouth once a week.      Historical Provider, MD    Allergies  Allergen Reactions  . Latex     History   Social History  . Marital Status: Married    Spouse Name: [name]    Number of Children: 0  . Years of Education: N/A   Occupational History  . Retired     Product/process development scientist  .     Social History Main Topics  .  Smoking status: Never Smoker   . Smokeless tobacco: Not on file  . Alcohol Use: Yes     socially  . Drug Use: No  . Sexually Active: Not on file   Other Topics Concern  . Not on file   Social History Narrative   Last name is pronounced "cas ka" No caffeine use. Always uses seat belts. Exercise:Moderate walks 30 min daily and rides a bike. Uses treadmill 20 mins everyday. Smoke alarm in home, No guns in the home.Married x 13 years: happily married. Most of family in [place].    Family History  Problem Relation Age of Onset  . Diabetes Mother     Objective:   Physical Exam  Nursing note and vitals reviewed. Constitutional: He is oriented to person, place, and time. He appears well-developed and well-nourished. No distress.  HENT:  Head: Normocephalic and atraumatic.  Right Ear: External ear normal.  Left Ear: External ear normal.  Nose: Nose normal.  Mouth/Throat: Oropharynx is clear and moist.  Eyes: Conjunctivae normal and EOM are normal. Pupils are equal, round, and reactive to light.  Neck: Normal range of motion. Neck supple. No JVD present. No tracheal deviation present. No thyromegaly  present.  Cardiovascular: Normal rate, regular rhythm, normal heart sounds and intact distal pulses.  Exam reveals no gallop and no friction rub.   No murmur heard. Pulmonary/Chest: Effort normal and breath sounds normal. No respiratory distress. He has no wheezes. He has no rales.  Abdominal: Soft. Bowel sounds are normal. He exhibits no distension and no mass. There is no tenderness. There is no rebound and no guarding.  Genitourinary: Rectum normal, prostate normal and penis normal. Guaiac negative stool. No penile tenderness.  Musculoskeletal: Normal range of motion. He exhibits no edema and no tenderness.  Lymphadenopathy:    He has no cervical adenopathy.  Neurological: He is alert and oriented to person, place, and time. He has normal reflexes. No cranial nerve deficit. He exhibits  normal muscle tone. Coordination normal.  Skin: Skin is warm and dry. No rash noted. He is not diaphoretic. No erythema. No pallor.  Psychiatric: He has a normal mood and affect. His behavior is normal. Judgment and thought content normal.        Assessment & Plan:   1. Mixed hyperlipidemia  Lipid panel  2. 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 Essential hypertension, benign  CBC with Differential, Comprehensive metabolic panel  4. Special screening for malignant neoplasm of prostate  PSA, Medicare  5. Screening for colon cancer  IFOBT POC (occult bld, rslt in office)  6. Routine general medical examination at a health care facility  POCT urinalysis dipstick, POCT UA - Microscopic Only  7. Herpes, genital  acyclovir (ZOVIRAX) 400 MG tablet  8.  Sinus drainage.   1.  CPE: anticipatory guidance.  Hemosure negative in office; pt declined referral for colonoscopy again;desires to wait until Spring 2014 to undergo procedure.  Pt declined influenza/Pneumovax/Zostavax immunizations.  Low fall risk; independent with ADLs; no evidence of depression; mild hearing loss.   2.  Screening for Prostate Cancer: obtain PSA; s/p digital rectal examination. 3. Screening for Colon Cancer: hemosure negative; to schedule colonoscopy Spring 2014. 4.  Glomerulonephritis: stable yet worsening Creatinine in past three months; repeat u/a and Cr today. 5.  Hyperlipidemia: Controlled; non-fasting today; will return in one month for fasting labs. 6.  Genital Herpes: stable; refill of Acyclovir provided. 7. HTN: controlled; no change in medication. 8.  Sinus Drainage L: new.  S/p ENT consult; concern that dental pathology contributing; appointment with dentist in upcoming week.

## 2012-03-07 LAB — COMPREHENSIVE METABOLIC PANEL
AST: 30 U/L (ref 0–37)
Albumin: 4.4 g/dL (ref 3.5–5.2)
BUN: 15 mg/dL (ref 6–23)
CO2: 29 mEq/L (ref 19–32)
Calcium: 9.3 mg/dL (ref 8.4–10.5)
Chloride: 103 mEq/L (ref 96–112)
Creat: 1.46 mg/dL — ABNORMAL HIGH (ref 0.50–1.35)
Potassium: 4.5 mEq/L (ref 3.5–5.3)

## 2012-03-07 LAB — PSA, MEDICARE: PSA: 2.03 ng/mL (ref <=4.00)

## 2012-03-07 LAB — CK: Total CK: 35 U/L (ref 7–232)

## 2012-03-13 NOTE — Progress Notes (Signed)
Reviewed and agree.

## 2012-04-19 DIAGNOSIS — E782 Mixed hyperlipidemia: Secondary | ICD-10-CM | POA: Diagnosis not present

## 2012-04-19 DIAGNOSIS — I739 Peripheral vascular disease, unspecified: Secondary | ICD-10-CM | POA: Diagnosis not present

## 2012-04-19 DIAGNOSIS — I10 Essential (primary) hypertension: Secondary | ICD-10-CM | POA: Diagnosis not present

## 2012-04-19 DIAGNOSIS — I2510 Atherosclerotic heart disease of native coronary artery without angina pectoris: Secondary | ICD-10-CM | POA: Diagnosis not present

## 2012-04-27 ENCOUNTER — Encounter: Payer: Self-pay | Admitting: Family Medicine

## 2012-05-10 DIAGNOSIS — N2581 Secondary hyperparathyroidism of renal origin: Secondary | ICD-10-CM | POA: Diagnosis not present

## 2012-05-10 DIAGNOSIS — D509 Iron deficiency anemia, unspecified: Secondary | ICD-10-CM | POA: Diagnosis not present

## 2012-05-10 DIAGNOSIS — I10 Essential (primary) hypertension: Secondary | ICD-10-CM | POA: Diagnosis not present

## 2012-05-10 DIAGNOSIS — E785 Hyperlipidemia, unspecified: Secondary | ICD-10-CM | POA: Diagnosis not present

## 2012-05-10 LAB — CBC AND DIFFERENTIAL: WBC: 5.9 10^3/mL

## 2012-05-10 LAB — BASIC METABOLIC PANEL
BUN: 16 mg/dL (ref 4–21)
Creatinine: 1.5 mg/dL — AB (ref 0.6–1.3)
Glucose: 84 mg/dL
Sodium: 139 mmol/L (ref 137–147)

## 2012-05-10 LAB — LIPID PANEL: LDL Cholesterol: 74 mg/dL

## 2012-06-05 ENCOUNTER — Other Ambulatory Visit: Payer: Self-pay | Admitting: Dermatology

## 2012-06-05 DIAGNOSIS — L820 Inflamed seborrheic keratosis: Secondary | ICD-10-CM | POA: Diagnosis not present

## 2012-06-05 DIAGNOSIS — L821 Other seborrheic keratosis: Secondary | ICD-10-CM | POA: Diagnosis not present

## 2012-06-05 DIAGNOSIS — Z85828 Personal history of other malignant neoplasm of skin: Secondary | ICD-10-CM | POA: Diagnosis not present

## 2012-06-05 DIAGNOSIS — D485 Neoplasm of uncertain behavior of skin: Secondary | ICD-10-CM | POA: Diagnosis not present

## 2012-07-18 DIAGNOSIS — I10 Essential (primary) hypertension: Secondary | ICD-10-CM | POA: Diagnosis not present

## 2012-07-18 DIAGNOSIS — N2581 Secondary hyperparathyroidism of renal origin: Secondary | ICD-10-CM | POA: Diagnosis not present

## 2012-07-18 DIAGNOSIS — D509 Iron deficiency anemia, unspecified: Secondary | ICD-10-CM | POA: Diagnosis not present

## 2012-07-29 ENCOUNTER — Other Ambulatory Visit: Payer: Self-pay

## 2012-08-03 ENCOUNTER — Emergency Department (HOSPITAL_COMMUNITY): Payer: Medicare Other

## 2012-08-03 ENCOUNTER — Emergency Department (HOSPITAL_COMMUNITY)
Admission: EM | Admit: 2012-08-03 | Discharge: 2012-08-03 | Disposition: A | Discharge status: Home / Self Care | Payer: Medicare Other | Attending: Emergency Medicine | Admitting: Emergency Medicine

## 2012-08-03 ENCOUNTER — Encounter (HOSPITAL_COMMUNITY): Payer: Self-pay | Admitting: Emergency Medicine

## 2012-08-03 DIAGNOSIS — Z8709 Personal history of other diseases of the respiratory system: Secondary | ICD-10-CM | POA: Diagnosis not present

## 2012-08-03 DIAGNOSIS — K219 Gastro-esophageal reflux disease without esophagitis: Secondary | ICD-10-CM | POA: Diagnosis not present

## 2012-08-03 DIAGNOSIS — Z8679 Personal history of other diseases of the circulatory system: Secondary | ICD-10-CM | POA: Insufficient documentation

## 2012-08-03 DIAGNOSIS — Z85828 Personal history of other malignant neoplasm of skin: Secondary | ICD-10-CM | POA: Insufficient documentation

## 2012-08-03 DIAGNOSIS — I2510 Atherosclerotic heart disease of native coronary artery without angina pectoris: Secondary | ICD-10-CM | POA: Insufficient documentation

## 2012-08-03 DIAGNOSIS — I10 Essential (primary) hypertension: Secondary | ICD-10-CM | POA: Diagnosis not present

## 2012-08-03 DIAGNOSIS — Z87798 Personal history of other (corrected) congenital malformations: Secondary | ICD-10-CM | POA: Diagnosis not present

## 2012-08-03 DIAGNOSIS — Z862 Personal history of diseases of the blood and blood-forming organs and certain disorders involving the immune mechanism: Secondary | ICD-10-CM | POA: Diagnosis not present

## 2012-08-03 DIAGNOSIS — Z87448 Personal history of other diseases of urinary system: Secondary | ICD-10-CM | POA: Insufficient documentation

## 2012-08-03 DIAGNOSIS — I739 Peripheral vascular disease, unspecified: Secondary | ICD-10-CM | POA: Diagnosis not present

## 2012-08-03 DIAGNOSIS — Z79899 Other long term (current) drug therapy: Secondary | ICD-10-CM | POA: Diagnosis not present

## 2012-08-03 DIAGNOSIS — Z7982 Long term (current) use of aspirin: Secondary | ICD-10-CM | POA: Diagnosis not present

## 2012-08-03 DIAGNOSIS — Z8619 Personal history of other infectious and parasitic diseases: Secondary | ICD-10-CM | POA: Diagnosis not present

## 2012-08-03 DIAGNOSIS — R52 Pain, unspecified: Secondary | ICD-10-CM | POA: Diagnosis not present

## 2012-08-03 DIAGNOSIS — R1013 Epigastric pain: Secondary | ICD-10-CM | POA: Insufficient documentation

## 2012-08-03 DIAGNOSIS — R109 Unspecified abdominal pain: Secondary | ICD-10-CM

## 2012-08-03 DIAGNOSIS — R110 Nausea: Secondary | ICD-10-CM | POA: Diagnosis not present

## 2012-08-03 DIAGNOSIS — K2990 Gastroduodenitis, unspecified, without bleeding: Secondary | ICD-10-CM | POA: Diagnosis not present

## 2012-08-03 LAB — CBC
HCT: 49.4 % (ref 39.0–52.0)
Hemoglobin: 17.3 g/dL — ABNORMAL HIGH (ref 13.0–17.0)
MCHC: 35 g/dL (ref 30.0–36.0)
RBC: 5.8 MIL/uL (ref 4.22–5.81)
WBC: 5.2 10*3/uL (ref 4.0–10.5)

## 2012-08-03 LAB — COMPREHENSIVE METABOLIC PANEL
ALT: 39 U/L (ref 0–53)
Alkaline Phosphatase: 106 U/L (ref 39–117)
BUN: 16 mg/dL (ref 6–23)
Chloride: 103 mEq/L (ref 96–112)
GFR calc Af Amer: 53 mL/min — ABNORMAL LOW (ref >90)
Glucose, Bld: 122 mg/dL — ABNORMAL HIGH (ref 70–99)
Potassium: 3.6 mEq/L (ref 3.5–5.1)
Sodium: 137 mEq/L (ref 135–145)
Total Bilirubin: 0.8 mg/dL (ref 0.3–1.2)
Total Protein: 6.8 g/dL (ref 6.0–8.3)

## 2012-08-03 MED ORDER — ONDANSETRON 8 MG PO TBDP
8.0000 mg | ORAL_TABLET | Freq: Once | ORAL | Status: AC
  Administered 2012-08-03: 8 mg via ORAL
  Filled 2012-08-03: qty 1

## 2012-08-03 MED ORDER — ONDANSETRON 8 MG PO TBDP: 8.0000 mg | ORAL_TABLET | Freq: Three times a day (TID) | ORAL | Status: DC | PRN

## 2012-08-03 MED ORDER — HYDROCODONE-ACETAMINOPHEN 5-325 MG PO TABS: 1.0000 | ORAL_TABLET | ORAL | Status: DC | PRN

## 2012-08-03 NOTE — ED Provider Notes (Signed)
History     CSN: [id number]  Arrival date & time 08/03/12  1041   First MD Initiated Contact with Patient 08/03/12 1047      Chief Complaint  Patient presents with  . Abdominal Pain     The history is provided by the patient.   patient reports that he awoke this morning had a normal breakfast and then straining to have a bowel movement and developed severe acute onset epigastric pain.  He developed some nausea in route but never vomited.  He reports that on arrival to emergency department his abdominal pain is now improving.  His pain is worse when he sits.  He states his nausea significantly improved.  No fevers or chills.  No diarrhea.  Mild upper abdominal tenderness.  No recent illness.  He's been doing well over the past several days.  This event that occurred was acute.  His wife was not present when this occurred.  No chest pain shortness of breath.  No syncope.  No palpitations.  No radiation of his pain towards his back.  No urinary symptoms.  No melena or hematochezia.  Past Medical History  Diagnosis Date  . CAD (coronary artery disease)   . Chronic renal insufficiency     glomerulonephritis 7829562.1  . Anemia, unspecified   . CVD (cardiovascular disease)   . Lung mass   . Cerebrovascular disease, unspecified   . Benign paroxysmal positional vertigo   . Esophageal reflux   . Peripheral vascular disease, unspecified   . Pure hypercholesterolemia   . Essential hypertension, benign   . Coronary atherosclerosis of unspecified type of bypass graft   . Genital herpes, unspecified   . Carcinoma in situ of skin, site unspecified   . Congenital anomaly of tongue, unspecified     Past Surgical History  Procedure Laterality Date  . Heart bypass  Nov 2006  . Lung biopsy  1989    left  . Carotid endarterectomy  2007    Right  . Cholecystectomy  07/2010    Family History  Problem Relation Age of Onset  . Diabetes Mother     History  Substance Use Topics  . Smoking  status: Never Smoker   . Smokeless tobacco: Not on file  . Alcohol Use: Yes     Comment: socially      Review of Systems  Gastrointestinal: Positive for abdominal pain.  All other systems reviewed and are negative.    Allergies  Latex  Home Medications   Current Outpatient Rx  Name  Route  Sig  Dispense  Refill  . aspirin EC 81 MG tablet   Oral   Take 81 mg by mouth at bedtime.         . Choline Fenofibrate (TRILIPIX) 45 MG capsule   Oral   Take 45 mg by mouth at bedtime.          [name] esomeprazole (NEXIUM) 40 MG capsule   Oral   Take 40 mg by mouth at bedtime.          [name] ezetimibe (ZETIA) 10 MG tablet   Oral   Take 10 mg by mouth daily.         . furosemide (LASIX) 20 MG tablet   Oral   Take 10 mg by mouth daily.         . metoprolol tartrate (LOPRESSOR) 25 MG tablet   Oral   Take 12.5 mg by mouth 2 (two) times daily.          [name]  nitroGLYCERIN (NITROSTAT) 0.4 MG SL tablet   Sublingual   Place 0.4 mg under the tongue every 5 (five) minutes as needed for chest pain.          . simvastatin (ZOCOR) 80 MG tablet   Oral   Take 80 mg by mouth at bedtime.           [name] HYDROcodone-acetaminophen (NORCO/VICODIN) 5-325 MG per tablet   Oral   Take 1 tablet by mouth every 4 (four) hours as needed for pain.   8 tablet   0   . ondansetron (ZOFRAN ODT) 8 MG disintegrating tablet   Oral   Take 1 tablet (8 mg total) by mouth every 8 (eight) hours as needed for nausea.   10 tablet   0     BP 158/61  Pulse 56  Temp(Src) 97.4 F (36.3 C) (Oral)  Resp 17  SpO2 100%  Physical Exam  Nursing note and vitals reviewed. Constitutional: He is oriented to person, place, and time. He appears well-developed and well-nourished.  HENT:  Head: Normocephalic and atraumatic.  Eyes: EOM are normal.  Neck: Normal range of motion.  Cardiovascular: Normal rate, regular rhythm, normal heart sounds and intact distal pulses.   Pulmonary/Chest: Effort normal and breath  sounds normal. No respiratory distress.  Abdominal: Soft. He exhibits no distension. There is no tenderness.  Musculoskeletal: Normal range of motion.  Neurological: He is alert and oriented to person, place, and time.  Skin: Skin is warm and dry.  Psychiatric: He has a normal mood and affect. Judgment normal.    ED Course  Procedures (including critical care time)  Labs Reviewed  CBC - Abnormal; Notable for the following:    Hemoglobin 17.3 (*)    Platelets 140 (*)    All other components within normal limits  COMPREHENSIVE METABOLIC PANEL - Abnormal; Notable for the following:    Glucose, Bld 122 (*)    Creatinine, Ser 1.49 (*)    AST 79 (*)    GFR calc non Af Amer 46 (*)    GFR calc Af Amer 53 (*)    All other components within normal limits  LIPASE, BLOOD   Dg Abd 2 Views  08/03/2012  *RADIOLOGY REPORT*  Clinical Data: Mid to upper abdominal pain with nausea.  ABDOMEN - 2 VIEW  Comparison: Chest radiographs 07/10/2010. PET CT 12/08/2009.  Findings: The heart size and mediastinal contours are stable. There is stable prominent epicardial fat adjacent to the left heart border.  Stool is present throughout the colon. There is prominent ingested material within the stomach.  There is no evidence of bowel obstruction or free intraperitoneal air.  Cholecystectomy clips are noted.  There is a mild thoracolumbar scoliosis.  IMPRESSION: Prominent ingested material within the stomach.  No evidence of bowel obstruction or acute process.   Original Report Authenticated By: Carey Bullocks, M.D.    I personally reviewed the imaging tests through PACS system I reviewed available ER/hospitalization records through the EMR   1. Abdominal pain       MDM  The patient's pain is resolving and is almost nearly resolved on its own without any treatment in the emergency department.  His pain was transient and seems to be improving rapidly.  Other than excessive workup in emergency apartment up to  person this patient home with close followup with his primary care physician.  He understands return to the ER for new or worsening symptoms.  Lyanne Co, MD 08/03/12 [phone]

## 2012-08-03 NOTE — ED Notes (Signed)
[id number]<GN> Expected date:<BR> Expected time:<BR> Means of arrival:<BR> Comments:<BR> 72 yo/Abdominal pain

## 2012-08-03 NOTE — ED Notes (Signed)
Per EMS, was having BM and strained a bit, now having epigastric pain, some nausea, no vomiting

## 2012-08-23 DIAGNOSIS — N2581 Secondary hyperparathyroidism of renal origin: Secondary | ICD-10-CM | POA: Diagnosis not present

## 2012-08-23 DIAGNOSIS — I10 Essential (primary) hypertension: Secondary | ICD-10-CM | POA: Diagnosis not present

## 2012-08-23 DIAGNOSIS — D509 Iron deficiency anemia, unspecified: Secondary | ICD-10-CM | POA: Diagnosis not present

## 2012-09-04 ENCOUNTER — Encounter: Payer: Self-pay | Admitting: Family Medicine

## 2012-09-04 ENCOUNTER — Ambulatory Visit (INDEPENDENT_AMBULATORY_CARE_PROVIDER_SITE_OTHER): Payer: Medicare Other | Admitting: Family Medicine

## 2012-09-04 VITALS — BP 140/80 | HR 59 | Temp 98.0°F | Resp 16 | Ht 66.5 in | Wt 156.4 lb

## 2012-09-04 DIAGNOSIS — I6529 Occlusion and stenosis of unspecified carotid artery: Secondary | ICD-10-CM | POA: Insufficient documentation

## 2012-09-04 DIAGNOSIS — E7800 Pure hypercholesterolemia, unspecified: Secondary | ICD-10-CM

## 2012-09-04 DIAGNOSIS — I2510 Atherosclerotic heart disease of native coronary artery without angina pectoris: Secondary | ICD-10-CM | POA: Insufficient documentation

## 2012-09-04 DIAGNOSIS — R7989 Other specified abnormal findings of blood chemistry: Secondary | ICD-10-CM | POA: Insufficient documentation

## 2012-09-04 DIAGNOSIS — E785 Hyperlipidemia, unspecified: Secondary | ICD-10-CM

## 2012-09-04 DIAGNOSIS — R1013 Epigastric pain: Secondary | ICD-10-CM | POA: Diagnosis not present

## 2012-09-04 DIAGNOSIS — I10 Essential (primary) hypertension: Secondary | ICD-10-CM | POA: Diagnosis not present

## 2012-09-04 DIAGNOSIS — N289 Disorder of kidney and ureter, unspecified: Secondary | ICD-10-CM | POA: Diagnosis not present

## 2012-09-04 DIAGNOSIS — R945 Abnormal results of liver function studies: Secondary | ICD-10-CM | POA: Insufficient documentation

## 2012-09-04 LAB — CBC WITH DIFFERENTIAL/PLATELET
Basophils Absolute: 0.1 10*3/uL (ref 0.0–0.1)
Basophils Relative: 1 % (ref 0–1)
Eosinophils Absolute: 0.1 10*3/uL (ref 0.0–0.7)
HCT: 49.7 % (ref 39.0–52.0)
Hemoglobin: 16.8 g/dL (ref 13.0–17.0)
MCH: 28 pg (ref 26.0–34.0)
MCHC: 33.8 g/dL (ref 30.0–36.0)
Monocytes Absolute: 0.6 10*3/uL (ref 0.1–1.0)
Monocytes Relative: 12 % (ref 3–12)
RDW: 13.8 % (ref 11.5–15.5)

## 2012-09-04 LAB — COMPREHENSIVE METABOLIC PANEL
BUN: 15 mg/dL (ref 6–23)
CO2: 25 mEq/L (ref 19–32)
Creat: 1.38 mg/dL — ABNORMAL HIGH (ref 0.50–1.35)
Glucose, Bld: 78 mg/dL (ref 70–99)
Total Bilirubin: 0.5 mg/dL (ref 0.3–1.2)

## 2012-09-04 NOTE — Assessment & Plan Note (Signed)
Stable; s/p recent cardiology follow-up.  Asymptomatic.

## 2012-09-04 NOTE — Assessment & Plan Note (Signed)
Controlled; recent labs obtained from cardiology.

## 2012-09-04 NOTE — Assessment & Plan Note (Signed)
Controlled with normal home readings; slightly elevated reading in office; continue to monitor closely; obtain labs.

## 2012-09-04 NOTE — Assessment & Plan Note (Signed)
New.  ED records reviewed in detail.  No recurrent pain.  S/p KUB, CBC, CMET, lipase all normal other than single elevated LFT; will repeat today.

## 2012-09-04 NOTE — Assessment & Plan Note (Signed)
Stable; repeat labs; s/p recent follow-up with nephrology on 08/23/12; await consult note.

## 2012-09-04 NOTE — Progress Notes (Signed)
[address]   [place], [place]  [address]   [phone]  Subjective:    Patient ID: [name], male    DOB: [birth year], 72 y.o.   MRN: [id number]  HPI This 72 y.o. male presents for six month follow-up:    1.  Abdominal cramping:  Onset 07/26/12; epigastric region.  At home alone; ate breakfast (cereal, apple, water, juice).  Had a bowel movement with acute onset of epigastric pain.  Laid down without relief.  Called 911.  Advised to take ASA 81mg  x 4.  Took to [facility]; evaluated in ED.  S/p xrays and labs all negative.  Within three hours, pain resolved.  No recurrent pain at all.   No nausea; got a little dizzy; felt due to ambulance ride.  No medication in ED.  No diarrhea.  Normal blowel movement.  Appetite fine.  Ate soup, bland diet.  No straining with bowel movement.  Took stool softener the following day.  Plans to travel next month ([place] and [place]) and [place].    2.  Glomerulonephritis:  Follow-up with [facility]; blood sugar and LFTs elevated.  Evaluated on 08/23/12.    3.  HTN: home readings 90-120 systolic.  4.  Sinus odor:  Intermittent; no blood; strictly an odor; no drainage.  Considering an MRI to rule out fistula.  No further headaches.  Suffers with odor every other day.  S/p dental cleaning; plans to pull L lower tooth.  L upper tooth extraction with onset of odor.    5.  CAD: s/p cardiology follow-up in 07/2012. Will continue every six months.  To decrease sonograms to every other year.     Past Medical History  Diagnosis Date  . CAD (coronary artery disease)   . Chronic renal insufficiency     glomerulonephritis 8413244.0  . Anemia, unspecified   . CVD (cardiovascular disease)   . Lung mass   . Cerebrovascular disease, unspecified   . Benign paroxysmal positional vertigo   . Esophageal reflux   . Peripheral vascular disease, unspecified   . Pure hypercholesterolemia   . Essential hypertension, benign   . Coronary atherosclerosis of  unspecified type of bypass graft   . Genital herpes, unspecified   . Carcinoma in situ of skin, site unspecified   . Congenital anomaly of tongue, unspecified     Past Surgical History  Procedure Laterality Date  . Heart bypass  Nov 2006  . Lung biopsy  1989    left  . Carotid endarterectomy  2007    Right  . Cholecystectomy  07/2010    Prior to Admission medications   Medication Sig Start Date End Date Taking? Authorizing Provider  aspirin EC 81 MG tablet Take 81 mg by mouth at bedtime.   Yes Historical Provider, MD  Choline Fenofibrate (TRILIPIX) 45 MG capsule Take 45 mg by mouth at bedtime.    Yes Historical Provider, MD  esomeprazole (NEXIUM) 40 MG capsule Take 40 mg by mouth at bedtime.    Yes Historical Provider, MD  ezetimibe (ZETIA) 10 MG tablet Take 10 mg by mouth daily.   Yes Historical Provider, MD  furosemide (LASIX) 20 MG tablet Take 10 mg by mouth daily.   Yes Historical Provider, MD  metoprolol tartrate (LOPRESSOR) 25 MG tablet Take 12.5 mg by mouth 2 (two) times daily.    Yes Historical Provider, MD  nitroGLYCERIN (NITROSTAT) 0.4 MG SL tablet Place 0.4 mg under the tongue every 5 (five) minutes as needed  for chest pain.    Yes Historical Provider, MD  simvastatin (ZOCOR) 80 MG tablet Take 80 mg by mouth at bedtime.     Yes Historical Provider, MD  HYDROcodone-acetaminophen (NORCO/VICODIN) 5-325 MG per tablet Take 1 tablet by mouth every 4 (four) hours as needed for pain. 08/03/12   Lyanne Co, MD  ondansetron (ZOFRAN ODT) 8 MG disintegrating tablet Take 1 tablet (8 mg total) by mouth every 8 (eight) hours as needed for nausea. 08/03/12   Lyanne Co, MD    Allergies  Allergen Reactions  . Latex Rash    History   Social History  . Marital Status: Married    Spouse Name: [name]    Number of Children: 0  . Years of Education: N/A   Occupational History  . Retired     Product/process development scientist  .     Social History Main Topics  . Smoking status: Never Smoker   .  Smokeless tobacco: Not on file  . Alcohol Use: Yes     Comment: socially  . Drug Use: No  . Sexually Active: Not on file   Other Topics Concern  . Not on file   Social History Narrative   Last name is pronounced "cas ka" No caffeine use. Always uses seat belts. Exercise:Moderate walks 30 min daily and rides a bike. Uses treadmill 20 mins everyday. Smoke alarm in home, No guns in the home.Married x 13 years: happily married. Most of family in [place].    Family History  Problem Relation Age of Onset  . Diabetes Mother        Review of Systems  Constitutional: Negative for fever, chills, diaphoresis, activity change, appetite change and fatigue.  HENT: Negative for congestion, sore throat, rhinorrhea, sneezing, mouth sores, postnasal drip and sinus pressure.   Respiratory: Negative for cough, shortness of breath, wheezing and stridor.   Cardiovascular: Negative for chest pain, palpitations and leg swelling.  Gastrointestinal: Positive for abdominal pain. Negative for nausea, vomiting, diarrhea, constipation, blood in stool, abdominal distention, anal bleeding and rectal pain.  Musculoskeletal: Negative for myalgias, back pain, joint swelling, arthralgias and gait problem.  Neurological: Negative for dizziness, tremors, seizures, syncope, facial asymmetry, speech difficulty, weakness, light-headedness, numbness and headaches.       Objective:   Physical Exam  Nursing note and vitals reviewed. Constitutional: He is oriented to person, place, and time. He appears well-developed and well-nourished. No distress.  HENT:  Head: Normocephalic and atraumatic.  Right Ear: External ear normal.  Left Ear: External ear normal.  Nose: Nose normal.  Mouth/Throat: Oropharynx is clear and moist.  Eyes: Conjunctivae and EOM are normal. Pupils are equal, round, and reactive to light.  Neck: Normal range of motion. Neck supple. No JVD present. No thyromegaly present.  Cardiovascular: Normal  rate, regular rhythm, normal heart sounds and intact distal pulses.  Exam reveals no gallop and no friction rub.   No murmur heard. Pulmonary/Chest: Effort normal and breath sounds normal. No respiratory distress. He has no wheezes. He has no rales.  Abdominal: Soft. Bowel sounds are normal. He exhibits no distension. There is no tenderness. There is no rebound and no guarding.  Lymphadenopathy:    He has no cervical adenopathy.  Neurological: He is alert and oriented to person, place, and time. No cranial nerve deficit. He exhibits normal muscle tone. Coordination normal.  Skin: No rash noted. He is not diaphoretic.  Psychiatric: He has a normal mood and affect. His behavior is normal. Judgment  and thought content normal.       Assessment & Plan:  Essential hypertension, benign - Plan: CBC with Differential, CK, Comprehensive metabolic panel, CANCELED: Lipid panel  Pure hypercholesterolemia - Plan: CANCELED: Lipid panel  Abdominal pain, epigastric  RENAL DISEASE  Liver function test abnormality

## 2012-09-04 NOTE — Patient Instructions (Addendum)
Essential hypertension, benign - Plan: CBC with Differential, CK, Comprehensive metabolic panel, Lipid panel  Pure hypercholesterolemia - Plan: Lipid panel  Abdominal pain, epigastric  RENAL DISEASE

## 2012-09-04 NOTE — Assessment & Plan Note (Signed)
New.  Associated with epigastric pain; recent ED records reviewed; obtain labs today.

## 2012-09-04 NOTE — Assessment & Plan Note (Signed)
Stable; to undergo carotid artery dopplers every 1-2 years.

## 2012-09-07 ENCOUNTER — Encounter: Payer: Self-pay | Admitting: Family Medicine

## 2012-09-08 DIAGNOSIS — H6120 Impacted cerumen, unspecified ear: Secondary | ICD-10-CM | POA: Diagnosis not present

## 2012-09-08 DIAGNOSIS — J329 Chronic sinusitis, unspecified: Secondary | ICD-10-CM | POA: Diagnosis not present

## 2012-09-15 ENCOUNTER — Encounter: Payer: Self-pay | Admitting: *Deleted

## 2012-10-30 ENCOUNTER — Other Ambulatory Visit: Payer: Self-pay | Admitting: Pharmacist

## 2012-10-30 MED ORDER — SIMVASTATIN 80 MG PO TABS: 80.0000 mg | ORAL_TABLET | Freq: Every day | ORAL | Status: DC

## 2012-11-10 ENCOUNTER — Telehealth: Payer: Self-pay | Admitting: Cardiovascular Disease

## 2012-11-10 ENCOUNTER — Encounter: Payer: Self-pay | Admitting: Family Medicine

## 2012-11-10 ENCOUNTER — Ambulatory Visit (INDEPENDENT_AMBULATORY_CARE_PROVIDER_SITE_OTHER): Payer: Medicare Other | Admitting: Family Medicine

## 2012-11-10 VITALS — BP 110/60 | HR 59 | Temp 98.0°F | Resp 16 | Ht 67.0 in | Wt 152.8 lb

## 2012-11-10 DIAGNOSIS — N059 Unspecified nephritic syndrome with unspecified morphologic changes: Secondary | ICD-10-CM | POA: Diagnosis not present

## 2012-11-10 DIAGNOSIS — E785 Hyperlipidemia, unspecified: Secondary | ICD-10-CM | POA: Diagnosis not present

## 2012-11-10 LAB — COMPREHENSIVE METABOLIC PANEL
ALT: 25 U/L (ref 0–53)
Albumin: 4.7 g/dL (ref 3.5–5.2)
CO2: 26 mEq/L (ref 19–32)
Calcium: 9 mg/dL (ref 8.4–10.5)
Chloride: 104 mEq/L (ref 96–112)
Creat: 1.57 mg/dL — ABNORMAL HIGH (ref 0.50–1.35)
Sodium: 139 mEq/L (ref 135–145)
Total Protein: 6.4 g/dL (ref 6.0–8.3)

## 2012-11-10 LAB — POCT URINALYSIS DIPSTICK
Bilirubin, UA: NEGATIVE
Blood, UA: NEGATIVE
Glucose, UA: NEGATIVE
Ketones, UA: NEGATIVE
Nitrite, UA: NEGATIVE
Spec Grav, UA: 1.01

## 2012-11-10 LAB — CBC WITH DIFFERENTIAL/PLATELET
Eosinophils Absolute: 0.1 10*3/uL (ref 0.0–0.7)
Hemoglobin: 16.1 g/dL (ref 13.0–17.0)
Lymphs Abs: 1.1 10*3/uL (ref 0.7–4.0)
MCH: 30.2 pg (ref 26.0–34.0)
Monocytes Relative: 13 % — ABNORMAL HIGH (ref 3–12)
Neutrophils Relative %: 55 % (ref 43–77)
RBC: 5.33 MIL/uL (ref 4.22–5.81)

## 2012-11-10 LAB — LIPID PANEL: Cholesterol: 114 mg/dL (ref 0–200)

## 2012-11-10 LAB — CK: Total CK: 40 U/L (ref 7–232)

## 2012-11-10 NOTE — Progress Notes (Signed)
   [address]   [place], [place]  [address]   [phone]  Subjective:    Patient ID: [name], male    DOB: 1940/07/02, 72 y.o.   MRN: [id number]  HPI This 72 y.o. male presents for evaluation of hyperlipidemia. Lab visit only; no MD encounter warranted.     Review of Systems     Objective:   Physical Exam        Assessment & Plan:  Glomerulonephritis - Plan: POCT urinalysis dipstick  Other and unspecified hyperlipidemia - Plan: CBC with Differential, CK, Comprehensive metabolic panel, Lipid panel

## 2012-11-10 NOTE — Telephone Encounter (Signed)
[name] will call back to schedule appointment

## 2012-11-22 ENCOUNTER — Encounter: Payer: Self-pay | Admitting: Family Medicine

## 2012-12-06 DIAGNOSIS — I10 Essential (primary) hypertension: Secondary | ICD-10-CM | POA: Diagnosis not present

## 2012-12-06 DIAGNOSIS — D509 Iron deficiency anemia, unspecified: Secondary | ICD-10-CM | POA: Diagnosis not present

## 2012-12-06 DIAGNOSIS — N2581 Secondary hyperparathyroidism of renal origin: Secondary | ICD-10-CM | POA: Diagnosis not present

## 2012-12-12 DIAGNOSIS — H40019 Open angle with borderline findings, low risk, unspecified eye: Secondary | ICD-10-CM | POA: Diagnosis not present

## 2012-12-13 ENCOUNTER — Other Ambulatory Visit: Payer: Self-pay

## 2012-12-13 MED ORDER — METOPROLOL TARTRATE 25 MG PO TABS: 12.5000 mg | ORAL_TABLET | Freq: Two times a day (BID) | ORAL | Status: DC

## 2012-12-13 NOTE — Telephone Encounter (Signed)
Rx was sent to pharmacy electronically. 

## 2013-01-15 DIAGNOSIS — L820 Inflamed seborrheic keratosis: Secondary | ICD-10-CM | POA: Diagnosis not present

## 2013-01-15 DIAGNOSIS — Z85828 Personal history of other malignant neoplasm of skin: Secondary | ICD-10-CM | POA: Diagnosis not present

## 2013-01-15 DIAGNOSIS — L821 Other seborrheic keratosis: Secondary | ICD-10-CM | POA: Diagnosis not present

## 2013-01-15 DIAGNOSIS — D1801 Hemangioma of skin and subcutaneous tissue: Secondary | ICD-10-CM | POA: Diagnosis not present

## 2013-01-17 ENCOUNTER — Other Ambulatory Visit: Payer: Self-pay

## 2013-01-31 DIAGNOSIS — R51 Headache: Secondary | ICD-10-CM | POA: Diagnosis not present

## 2013-01-31 DIAGNOSIS — J329 Chronic sinusitis, unspecified: Secondary | ICD-10-CM | POA: Diagnosis not present

## 2013-02-04 ENCOUNTER — Ambulatory Visit (INDEPENDENT_AMBULATORY_CARE_PROVIDER_SITE_OTHER): Payer: Medicare Other | Admitting: Family Medicine

## 2013-02-04 VITALS — BP 140/60 | HR 41 | Temp 97.7°F | Resp 18 | Wt 156.0 lb

## 2013-02-04 DIAGNOSIS — J0110 Acute frontal sinusitis, unspecified: Secondary | ICD-10-CM

## 2013-02-04 DIAGNOSIS — I498 Other specified cardiac arrhythmias: Secondary | ICD-10-CM | POA: Diagnosis not present

## 2013-02-04 DIAGNOSIS — I493 Ventricular premature depolarization: Secondary | ICD-10-CM

## 2013-02-04 DIAGNOSIS — R51 Headache: Secondary | ICD-10-CM

## 2013-02-04 DIAGNOSIS — R001 Bradycardia, unspecified: Secondary | ICD-10-CM

## 2013-02-04 LAB — POCT CBC
Lymph, poc: 1.5 (ref 0.6–3.4)
MCHC: 33.3 g/dL (ref 31.8–35.4)
MPV: 7.9 fL (ref 0–99.8)
POC Granulocyte: 4.4 (ref 2–6.9)
POC LYMPH PERCENT: 22.8 %L (ref 10–50)
POC MID %: 8 %M (ref 0–12)
RDW, POC: 13.7 %

## 2013-02-04 LAB — COMPREHENSIVE METABOLIC PANEL
ALT: 23 U/L (ref 0–53)
AST: 31 U/L (ref 0–37)
BUN: 16 mg/dL (ref 6–23)
CO2: 28 mEq/L (ref 19–32)
Creat: 1.68 mg/dL — ABNORMAL HIGH (ref 0.50–1.35)
Total Bilirubin: 0.6 mg/dL (ref 0.3–1.2)

## 2013-02-04 LAB — TSH: TSH: 1.867 u[IU]/mL (ref 0.350–4.500)

## 2013-02-04 LAB — POCT SEDIMENTATION RATE: POCT SED RATE: 15 mm/hr (ref 0–22)

## 2013-02-04 MED ORDER — TRAMADOL HCL 50 MG PO TABS: 50.0000 mg | ORAL_TABLET | Freq: Four times a day (QID) | ORAL | Status: DC | PRN

## 2013-02-04 NOTE — Progress Notes (Signed)
[address]   [place], [place]  [address]   [phone]  Subjective:    Patient ID: [name], male    DOB: 1940-12-20, 72 y.o.   MRN: [id number]  HPI This 72 y.o. male presents for evaluation of sinusitis.  Onset of sinus congestion one week ago; evaluated by Dr. Isaias Cowman; prescribed Avelox and Nasonex.  Has taken four Avelox thus far.  Pain in sinuses improves by noon on recent days; around midnight, pain recurs.   Pain in sinus region is so intense.  No pain medication prescribed by Jearld Fenton.  S/p CT scan of sinuses; +L maxillary and frontal sinuses inflame; has follow-up with Jearld Fenton in three weeks.  Similar symptoms 1-2 times before in past two years; prescribed Amoxicillin with resolution; similar symptoms 2014; prescribed Amoxicillin with improvement.  Suffers with intense pain with sinus infections. Does not like to take pain medication other than ASA.  No fever/chills/sweats with sinusitis.  No ear pain.  No sore throat. +rhinorrhea with bloody drainage in beginning of sinus issues; no cough; no PND.  No blurred vision but having pain with reading.  No dizziness; no facial rash.  No slurred speech or difficulties swallowing.  2. HTN:  Blood pressure has also been eratic; getting some really low readings on pulse rate also.  Seeing Dr. Allyson Sabal yearly; follow-up not scheduled.  Taking Metoprolol 25mg  1/2 bid.  Feels great; no chest pain, palpitations, SOB, DOE, leg swelling; no fatigue.  Pulse at home ranging 30-60; BP at home ranging 114-160/60-80.    Review of Systems  Constitutional: Negative for chills, diaphoresis and fatigue.  HENT: Positive for congestion, dental problem and sinus pressure. Negative for hearing loss, ear pain, sore throat, facial swelling, rhinorrhea, sneezing, trouble swallowing, neck pain, neck stiffness, voice change, postnasal drip and ear discharge.   Respiratory: Negative for cough, shortness of breath, wheezing and stridor.   Cardiovascular: Negative for  chest pain, palpitations and leg swelling.  Gastrointestinal: Negative for nausea, vomiting, abdominal pain and diarrhea.  Skin: Negative for rash.  Neurological: Positive for headaches. Negative for dizziness, tremors, seizures, syncope, facial asymmetry, speech difficulty, weakness, light-headedness and numbness.    Past Medical History  Diagnosis Date  . CAD (coronary artery disease)   . Chronic renal insufficiency     glomerulonephritis 2130865.7  . Anemia, unspecified   . CVD (cardiovascular disease)   . Lung mass   . Cerebrovascular disease, unspecified   . Benign paroxysmal positional vertigo   . Esophageal reflux   . Peripheral vascular disease, unspecified   . Pure hypercholesterolemia   . Essential hypertension, benign   . Coronary atherosclerosis of unspecified type of bypass graft   . Genital herpes, unspecified   . Carcinoma in situ of skin, site unspecified   . Congenital anomaly of tongue, unspecified   . Blood transfusion without reported diagnosis     Past Surgical History  Procedure Laterality Date  . Heart bypass  Nov 2006  . Lung biopsy  1989    left  . Carotid endarterectomy  2007    Right  . Cholecystectomy  07/2010    Prior to Admission medications   Medication Sig Start Date End Date Taking? Authorizing Provider  aspirin EC 81 MG tablet Take 81 mg by mouth at bedtime.   Yes Historical Provider, MD  Choline Fenofibrate (TRILIPIX) 45 MG capsule Take 45 mg by mouth at bedtime.    Yes Historical Provider, MD  esomeprazole (NEXIUM) 40 MG capsule Take 40  mg by mouth at bedtime.    Yes Historical Provider, MD  ezetimibe (ZETIA) 10 MG tablet Take 10 mg by mouth daily.   Yes Historical Provider, MD  furosemide (LASIX) 20 MG tablet Take 10 mg by mouth daily.   Yes Historical Provider, MD  metoprolol tartrate (LOPRESSOR) 25 MG tablet Take 0.5 tablets (12.5 mg total) by mouth 2 (two) times daily. 12/13/12  Yes Runell Gess, MD  moxifloxacin (AVELOX) 400 MG  tablet Take 400 mg by mouth daily.   Yes Historical Provider, MD  nitroGLYCERIN (NITROSTAT) 0.4 MG SL tablet Place 0.4 mg under the tongue every 5 (five) minutes as needed for chest pain.    Yes Historical Provider, MD  simvastatin (ZOCOR) 80 MG tablet Take 1 tablet (80 mg total) by mouth at bedtime. 10/30/12  Yes Runell Gess, MD  HYDROcodone-acetaminophen (NORCO/VICODIN) 5-325 MG per tablet Take 1 tablet by mouth every 4 (four) hours as needed for pain. 08/03/12   Lyanne Co, MD  ondansetron (ZOFRAN ODT) 8 MG disintegrating tablet Take 1 tablet (8 mg total) by mouth every 8 (eight) hours as needed for nausea. 08/03/12   Lyanne Co, MD  traMADol (ULTRAM) 50 MG tablet Take 1 tablet (50 mg total) by mouth every 6 (six) hours as needed for pain. 02/04/13   Ethelda Chick, MD    Allergies  Allergen Reactions  . Latex Rash    History   Social History  . Marital Status: Married    Spouse Name: [name]    Number of Children: 0  . Years of Education: N/A   Occupational History  . Retired     Product/process development scientist  .     Social History Main Topics  . Smoking status: Never Smoker   . Smokeless tobacco: Not on file  . Alcohol Use: Yes     Comment: socially  . Drug Use: No  . Sexual Activity: Yes   Other Topics Concern  . Not on file   Social History Narrative   Last name is pronounced "cas ka" No caffeine use. Always uses seat belts. Exercise:Moderate walks 30 min daily and rides a bike. Uses treadmill 20 mins everyday. Smoke alarm in home, No guns in the home.Married x 13 years: happily married. Most of family in [place].    Family History  Problem Relation Age of Onset  . Diabetes Mother        Objective:   Physical Exam  Nursing note and vitals reviewed. Constitutional: He is oriented to person, place, and time. He appears well-developed and well-nourished. No distress.  HENT:  Head: Normocephalic and atraumatic.  Right Ear: External ear normal.  Left Ear: External ear  normal.  Nose: Mucosal edema and rhinorrhea present. Right sinus exhibits no maxillary sinus tenderness and no frontal sinus tenderness. Left sinus exhibits maxillary sinus tenderness and frontal sinus tenderness.  Mouth/Throat: Mucous membranes are normal. No oropharyngeal exudate, posterior oropharyngeal edema, posterior oropharyngeal erythema or tonsillar abscesses.  Eyes: Conjunctivae and EOM are normal. Pupils are equal, round, and reactive to light.  Neck: Normal range of motion. Neck supple.  Cardiovascular: Normal rate and regular rhythm.  Exam reveals no gallop and no friction rub.   No murmur heard. Pulmonary/Chest: Effort normal and breath sounds normal. He has no wheezes. He has no rales.  Lymphadenopathy:    He has cervical adenopathy.  Neurological: He is alert and oriented to person, place, and time. No cranial nerve deficit. He exhibits normal muscle tone.  Coordination normal.  Skin: Skin is warm and dry. No rash noted. He is not diaphoretic.  Psychiatric: He has a normal mood and affect. His behavior is normal.      Results for orders placed in visit on 02/04/13  POCT CBC      Result Value Range   WBC 6.4  4.6 - 10.2 K/uL   Lymph, poc 1.5  0.6 - 3.4   POC LYMPH PERCENT 22.8  10 - 50 %L   MID (cbc) 0.5  0 - 0.9   POC MID % 8.0  0 - 12 %M   POC Granulocyte 4.4  2 - 6.9   Granulocyte percent 69.2  37 - 80 %G   RBC 5.70  4.69 - 6.13 M/uL   Hemoglobin 17.3  14.1 - 18.1 g/dL   HCT, POC 78.2  95.6 - 53.7 %   MCV 91.0  80 - 97 fL   MCH, POC 30.4  27 - 31.2 pg   MCHC 33.3  31.8 - 35.4 g/dL   RDW, POC 21.3     Platelet Count, POC 249  142 - 424 K/uL   MPV 7.9  0 - 99.8 fL   EKG: NSR; +frequent PVCs. Assessment & Plan:  Bradycardia - Plan: POCT CBC, POCT SEDIMENTATION RATE, Comprehensive metabolic panel, TSH, EKG 12-Lead  Headache(784.0) - Plan: POCT CBC, POCT SEDIMENTATION RATE  Sinusitis, acute frontal   1. Bradycardia:  New.  Obtain labs.  Decrease Metoprolol to  25mg  1/2 every morning; contact Dr. Berry/Cardiology in a.m. For appointment.  Heart rate decreasing to 30s on multiple occasions at home.  Concern for heart block or SSS.  Asymptomatic; warrants event or holter monitor. 2.  Headache:  New.  Associated with L frontal sinusitis by CT scan.  Normal neurological exam in office.  Obtain ESR.  Rx for Tramadol provided; if does not control pain, to call for hydrocodone rx.  May benefit from Prednisone taper if no improvement in upcoming week. 3.  Acute frontal sinusitis:  New.  S/p consultation by Jearld Fenton of ENT; continue Avelox and Nasonex; may warrant sinus surgery. 4. HTN: worsening; labile BP readings lately; no changes at this time other than decreasing Metoprolol to 25mg  1/2 every morning due to bradcycardia. 5. Frequent PVCs: New.  Obtain labs.  Meds ordered this encounter  Medications  . moxifloxacin (AVELOX) 400 MG tablet    Sig: Take 400 mg by mouth daily.  . traMADol (ULTRAM) 50 MG tablet    Sig: Take 1 tablet (50 mg total) by mouth every 6 (six) hours as needed for pain.    Dispense:  40 tablet    Refill:  0

## 2013-02-05 ENCOUNTER — Ambulatory Visit (INDEPENDENT_AMBULATORY_CARE_PROVIDER_SITE_OTHER): Payer: Medicare Other | Admitting: Cardiology

## 2013-02-05 ENCOUNTER — Encounter: Payer: Self-pay | Admitting: Cardiology

## 2013-02-05 ENCOUNTER — Telehealth: Payer: Self-pay | Admitting: Cardiology

## 2013-02-05 ENCOUNTER — Encounter (HOSPITAL_COMMUNITY): Payer: Self-pay | Admitting: Cardiovascular Disease

## 2013-02-05 VITALS — BP 148/70 | HR 68 | Ht 69.0 in | Wt 158.8 lb

## 2013-02-05 DIAGNOSIS — I2510 Atherosclerotic heart disease of native coronary artery without angina pectoris: Secondary | ICD-10-CM | POA: Diagnosis not present

## 2013-02-05 DIAGNOSIS — E785 Hyperlipidemia, unspecified: Secondary | ICD-10-CM

## 2013-02-05 DIAGNOSIS — Z951 Presence of aortocoronary bypass graft: Secondary | ICD-10-CM

## 2013-02-05 DIAGNOSIS — R002 Palpitations: Secondary | ICD-10-CM | POA: Diagnosis not present

## 2013-02-05 DIAGNOSIS — I4949 Other premature depolarization: Secondary | ICD-10-CM

## 2013-02-05 DIAGNOSIS — I493 Ventricular premature depolarization: Secondary | ICD-10-CM

## 2013-02-05 DIAGNOSIS — I498 Other specified cardiac arrhythmias: Secondary | ICD-10-CM

## 2013-02-05 DIAGNOSIS — R001 Bradycardia, unspecified: Secondary | ICD-10-CM

## 2013-02-05 NOTE — Telephone Encounter (Signed)
Same as when he had last stress in 2012, lexiscan.

## 2013-02-05 NOTE — Telephone Encounter (Signed)
Just left here-he saw Laura-he wants to ask her a question-pt did not state his question.

## 2013-02-05 NOTE — Telephone Encounter (Signed)
Returned call.  Pt with concerns about what meds may be given during stress test.  Pt informed RN will get information and call back.  Pt verbalized understanding and agreed w/ plan.  Message forwarded to L. Annie Paras, NP for further instructions.

## 2013-02-05 NOTE — Assessment & Plan Note (Signed)
No chest pain but concern with new bigeminy PVCs this could be due to ischemia plan LexiScan Myoview.

## 2013-02-05 NOTE — Assessment & Plan Note (Signed)
Last lipid panel was in May of 2014 with LDL of 44

## 2013-02-05 NOTE — Assessment & Plan Note (Signed)
Bigemny PVCs. Plan for nuc study to eval for ischemia mediated PVCs with normal electrolytes.

## 2013-02-05 NOTE — Assessment & Plan Note (Signed)
HR at home and in PCP was 30 at times.  It may be with bigeminy PVCs the premature beat is not perfused.  But will leave the lopressor at 12.5 mg BID for now.  Will order cardionet to eval PVC burden and if true bradycardia. Symptomatic arrthymia.

## 2013-02-05 NOTE — Progress Notes (Signed)
02/05/2013   PCP: Nilda Simmer, MD   Chief Complaint  Patient presents with  . abnormal EKG    Primary Cardiologist: Dr. Allyson Sabal  HPI:  72 year old white male presents today the request of his primary care physician Dr. Katrinka Blazing.  He been seen for sinusitis and pain from the sinusitis. And also been seen by Dr. Jearld Fenton for the sinusitis.  While there he stated his heart rate has been down into the 30s and up to the 60s and blood pressure 114-160 systolic. He had no complaints of dizziness or lightheadedness.  No chest pain.  His metoprolol was decreased from 12.5 BID to 12.5 daily.  He is here today for followup.  Labs were stable with sodium 136 potassium 4.7 chloride 99 CO2 28 glucose 88 BUN 16 creatinine 1.68 which was slightly higher though he runs 1.38-1.57.  LFTs were normal.  TSH 1.867.  Hemoglobin and hematocrit were stable.  As stated no chest pain no lightheadedness no dizziness no shortness of breath. He does state after taking Ultram  For his sinusitis he felt mildly dizzy.  He has a history of coronary artery disease and peripheral vascular disease. Had bypass grafting in 2006 with a LIMA to the LAD, vein graft to the RCA, circumflex OM and diagonal branches. HEENT head paroxysmal A. Fib postoperatively but none since. His last cardiac catheterization was January 2012 with patent grafts and moderate aortic iliac disease. He has known aortic iliac and left SFA disease he also has had a right carotid endarterectomy. Other history includes hypertension and dyslipidemia and moderate renal insufficiency followed by Dr. Isa Rankin.  Last lipids were made to cuff 14 with total cholesterol 114, triglycerides 163, HDL 37 and LDL 44.  Allergies  Allergen Reactions  . Latex Rash    Current Outpatient Prescriptions  Medication Sig Dispense Refill  . aspirin EC 81 MG tablet Take 81 mg by mouth at bedtime.      . Choline Fenofibrate (TRILIPIX) 45 MG capsule Take 45 mg by mouth at  bedtime.       [name] esomeprazole (NEXIUM) 40 MG capsule Take 40 mg by mouth at bedtime.       [name] ezetimibe (ZETIA) 10 MG tablet Take 10 mg by mouth daily.      . furosemide (LASIX) 20 MG tablet Take 10 mg by mouth daily.      . metoprolol tartrate (LOPRESSOR) 25 MG tablet Take 0.5 tablets (12.5 mg total) by mouth 2 (two) times daily.  90 tablet  1  . moxifloxacin (AVELOX) 400 MG tablet Take 400 mg by mouth daily.      . nitroGLYCERIN (NITROSTAT) 0.4 MG SL tablet Place 0.4 mg under the tongue every 5 (five) minutes as needed for chest pain.       [name] ondansetron (ZOFRAN ODT) 8 MG disintegrating tablet Take 1 tablet (8 mg total) by mouth every 8 (eight) hours as needed for nausea.  10 tablet  0  . simvastatin (ZOCOR) 80 MG tablet Take 1 tablet (80 mg total) by mouth at bedtime.  30 tablet  6  . traMADol (ULTRAM) 50 MG tablet Take 1 tablet (50 mg total) by mouth every 6 (six) hours as needed for pain.  40 tablet  0   No current facility-administered medications for this visit.    Past Medical History  Diagnosis Date  . CAD (coronary artery disease)     HX CABG 2006 X 4; last nuc 11/2010 last cath 2012-wth 95%  OM too small for intervention, treated medically   . Chronic renal insufficiency     glomerulonephritis 1610960.4  . Anemia, unspecified   . Lung mass   . Cerebrovascular disease, unspecified     hx CEA-RT; KNOWN AOTOILIAC 7 LT sfa disease  . Benign paroxysmal positional vertigo   . Esophageal reflux   . Peripheral vascular disease, unspecified   . Pure hypercholesterolemia   . Essential hypertension, benign   . Genital herpes, unspecified   . Carcinoma in situ of skin, site unspecified   . Congenital anomaly of tongue, unspecified   . Blood transfusion without reported diagnosis   . PAF (paroxysmal atrial fibrillation)     post CABG, maintaining SR    Past Surgical History  Procedure Laterality Date  . Coronary artery bypass graft  05/12/2005    LIMA-LAD, VG-DIAG; VG-LCX,OM;  VG-RCA  . Carotid endarterectomy  2007    Right  . Cholecystectomy  07/2010    VWU:JWJXBJY:+ sinusitis, no weight changes Skin:no rashes or ulcers HEENT:no blurred vision, no congestion CV:see HPI PUL:see HPI GI:no diarrhea constipation or melena, no indigestion GU:no hematuria, no dysuria MS:no joint pain, no claudication Neuro:no syncope, no lightheadedness Endo:no diabetes, no thyroid disease  PHYSICAL EXAM BP 148/70  Pulse 68  Ht 5\' 9"  (1.753 m)  Wt 158 lb 12.8 oz (72.031 kg)  BMI 23.44 kg/m2 General:Pleasant affect, NAD Skin:Warm and dry, brisk capillary refill HEENT:normocephalic, sclera clear, mucus membranes moist Neck:supple, no JVD, no bruits  Heart:S1S2 RRR with premature beats, without murmur, gallup, rub or click Lungs:clear without rales, rhonchi, or wheezes NWG:NFAO, non tender, + BS, do not palpate liver spleen or masses Ext:no lower ext edema, 2+ pedal pulses, 2+ radial pulses Neuro:alert and oriented, MAE, follows commands, + facial symmetry  EKG:SR with bigeminy PVCs. No acute changes other than PVCs.   ASSESSMENT AND PLAN Bradycardia HR at home and in PCP was 30 at times.  It may be with bigeminy PVCs the premature beat is not perfused.  But will leave the lopressor at 12.5 mg BID for now.  Will order cardionet to eval PVC burden and if true bradycardia. Symptomatic arrthymia.    Frequent unifocal PVCs Bigemny PVCs. Plan for nuc study to eval for ischemia mediated PVCs with normal electrolytes.  CAD (coronary artery disease), hx CABG in 2006.  last cath 2012 with patent grafts.  No chest pain but concern with new bigeminy PVCs this could be due to ischemia plan LexiScan Myoview.  HYPERLIPIDEMIA Last lipid panel was in May of 2014 with LDL of 44

## 2013-02-05 NOTE — Patient Instructions (Addendum)
Continue metoprolol at current dose 12.5 mg twice a day  We will schedule you for a lexiscan myoview to make sure your grafts are good.  We will have you wear a monitor to see if these premature beats are consistent.  Call if lightheaded, dizzy or chest pain.  Follow up with Dr. Allyson Sabal after the stress test

## 2013-02-06 DIAGNOSIS — R002 Palpitations: Secondary | ICD-10-CM | POA: Diagnosis not present

## 2013-02-06 NOTE — Telephone Encounter (Signed)
Returned call and informed pt per instructions by NP.  Pt verbalized understanding and agreed w/ plan.  

## 2013-02-08 ENCOUNTER — Encounter: Payer: Self-pay | Admitting: Family Medicine

## 2013-02-08 ENCOUNTER — Ambulatory Visit (HOSPITAL_COMMUNITY)
Admission: RE | Admit: 2013-02-08 | Discharge: 2013-02-08 | Disposition: A | Discharge status: Home / Self Care | Payer: Medicare Other | Source: Ambulatory Visit | Attending: Cardiology | Admitting: Cardiology

## 2013-02-08 DIAGNOSIS — I6529 Occlusion and stenosis of unspecified carotid artery: Secondary | ICD-10-CM | POA: Insufficient documentation

## 2013-02-08 DIAGNOSIS — R079 Chest pain, unspecified: Secondary | ICD-10-CM | POA: Diagnosis not present

## 2013-02-08 DIAGNOSIS — Z951 Presence of aortocoronary bypass graft: Secondary | ICD-10-CM | POA: Diagnosis not present

## 2013-02-08 DIAGNOSIS — I739 Peripheral vascular disease, unspecified: Secondary | ICD-10-CM | POA: Insufficient documentation

## 2013-02-08 DIAGNOSIS — I4891 Unspecified atrial fibrillation: Secondary | ICD-10-CM | POA: Insufficient documentation

## 2013-02-08 DIAGNOSIS — I25810 Atherosclerosis of coronary artery bypass graft(s) without angina pectoris: Secondary | ICD-10-CM

## 2013-02-08 DIAGNOSIS — I2510 Atherosclerotic heart disease of native coronary artery without angina pectoris: Secondary | ICD-10-CM | POA: Diagnosis not present

## 2013-02-08 DIAGNOSIS — I10 Essential (primary) hypertension: Secondary | ICD-10-CM | POA: Insufficient documentation

## 2013-02-08 DIAGNOSIS — I493 Ventricular premature depolarization: Secondary | ICD-10-CM

## 2013-02-08 DIAGNOSIS — R001 Bradycardia, unspecified: Secondary | ICD-10-CM

## 2013-02-08 MED ORDER — TECHNETIUM TC 99M SESTAMIBI GENERIC - CARDIOLITE
30.0000 | Freq: Once | INTRAVENOUS | Status: AC | PRN
  Administered 2013-02-08: 30 via INTRAVENOUS

## 2013-02-08 MED ORDER — REGADENOSON 0.4 MG/5ML IV SOLN
0.4000 mg | Freq: Once | INTRAVENOUS | Status: AC
  Administered 2013-02-08: 0.4 mg via INTRAVENOUS

## 2013-02-08 MED ORDER — TECHNETIUM TC 99M SESTAMIBI GENERIC - CARDIOLITE
10.0000 | Freq: Once | INTRAVENOUS | Status: AC | PRN
  Administered 2013-02-08: 10 via INTRAVENOUS

## 2013-02-08 MED ORDER — AMINOPHYLLINE 25 MG/ML IV SOLN
75.0000 mg | Freq: Once | INTRAVENOUS | Status: AC
  Administered 2013-02-08: 75 mg via INTRAVENOUS

## 2013-02-08 NOTE — Procedures (Addendum)
[facility] [address] [place] [address] [phone]  Cardiology Nuclear Med Study  [name] is a 72 y.o. male     MRN : [id number]     DOB: 1940/10/05  Procedure Date: 02/08/2013  Nuclear Med Background Indication for Stress Test:  Graft Patency History:  CAD;CABG X4-11/2010;PAF Cardiac Risk Factors: Carotid Disease, Hypertension, Lipids and PVD  Symptoms:  Chest Pain and Palpitations   Nuclear Pre-Procedure Caffeine/Decaff Intake:  7:00pm NPO After: 5:00am   IV Site: R Hand  IV 0.9% NS with Angio Cath:  22g  Chest Size (in):  40"  IV Started by: Emmit Pomfret, RN  Height: 5\' 9"  (1.753 m)  Cup Size: n/a  BMI:  Body mass index is 23.32 kg/(m^2). Weight:  158 lb (71.668 kg)   Tech Comments:  N/A    Nuclear Med Study 1 or 2 day study: 1 day  Stress Test Type:  Lexiscan  Order Authorizing Provider:  Nanetta Batty, MD   Resting Radionuclide: Technetium 34m Sestamibi  Resting Radionuclide Dose: 9.8 mCi   Stress Radionuclide:  Technetium 50m Sestamibi  Stress Radionuclide Dose: 31.2 mCi           Stress Protocol Rest HR:66 Stress HR: 99  Rest BP: 132/79 Stress BP: 153/84  Exercise Time (min): n/a METS: n/a          Dose of Adenosine (mg):  n/a Dose of Lexiscan: 0.4 mg  Dose of Atropine (mg): n/a Dose of Dobutamine: n/a mcg/kg/min (at max HR)  Stress Test Technologist: Ernestene Mention, CCT Nuclear Technologist: Koren Shiver, CNMT   Rest Procedure:  Myocardial perfusion imaging was performed at rest 45 minutes following the intravenous administration of Technetium 39m Sestamibi. Stress Procedure:  The patient received IV Lexiscan 0.4 mg over 15-seconds.  Technetium 22m Sestamibi injected at 30-seconds. Due to patient's shortness of breath, dizziness and feeling flushed, he was given IV Aminophylline 75 mg. Symptoms were resolved during recovery.  There were no significant changes with Lexiscan.   Quantitative spect images were obtained after a 45 minute delay.  Transient Ischemic Dilatation (Normal <1.22):  1.03 Lung/Heart Ratio (Normal <0.45):  0.26 QGS EDV: n/a ml QGS ESV:  n/a ml LV Ejection Fraction: Study not gated  Signed by       Rest ECG: NSR - Normal EKG  Stress ECG: No significant change from baseline ECG  QPS Raw Data Images:  Normal; no motion artifact; normal heart/lung ratio. Stress Images:  there is a mild reduction in trace uptake in the distal segment of the inferolateral wall. Rest Images:  Comparison with the stress images reveals no significant change. Subtraction (SDS):  No evidence of ischemia.  Impression Exercise Capacity:  Lexiscan with no exercise. BP Response:  Normal blood pressure response. Clinical Symptoms:  No significant symptoms noted. ECG Impression:  No significant ST segment change suggestive of ischemia. Comparison with Prior Nuclear Study: A similar inferolateral defect is seen on the 2012 study  Overall Impression:  Low risk stress nuclear study with possible small inferolateral infarct..  LV Wall Motion:  the study could not be gated.   Thurmon Fair, MD  02/08/2013 1:23 PM

## 2013-02-13 ENCOUNTER — Other Ambulatory Visit: Payer: Self-pay | Admitting: *Deleted

## 2013-02-13 MED ORDER — METOPROLOL TARTRATE 25 MG PO TABS: 12.5000 mg | ORAL_TABLET | Freq: Two times a day (BID) | ORAL | Status: DC

## 2013-02-13 NOTE — Telephone Encounter (Signed)
Rx was sent to pharmacy electronically. 

## 2013-02-15 ENCOUNTER — Telehealth: Payer: Self-pay | Admitting: Cardiology

## 2013-02-15 NOTE — Telephone Encounter (Signed)
Returned call.  Pt informed message received.  Informed he will need to wear the monitor until 9.7.14 and it will let him know when to take it off and send back via [employer].  Pt advised to call [employer] or go to [employer] store to ship.  Pt verbalized understanding and agreed w/ plan.

## 2013-02-15 NOTE — Telephone Encounter (Signed)
Pt was calling concerning his monitor. He can not remember what day he needs send it back

## 2013-02-21 ENCOUNTER — Other Ambulatory Visit: Payer: Self-pay | Admitting: *Deleted

## 2013-02-21 DIAGNOSIS — Z951 Presence of aortocoronary bypass graft: Secondary | ICD-10-CM

## 2013-02-21 DIAGNOSIS — R001 Bradycardia, unspecified: Secondary | ICD-10-CM

## 2013-02-21 DIAGNOSIS — I2510 Atherosclerotic heart disease of native coronary artery without angina pectoris: Secondary | ICD-10-CM

## 2013-02-21 DIAGNOSIS — I493 Ventricular premature depolarization: Secondary | ICD-10-CM

## 2013-02-21 DIAGNOSIS — J329 Chronic sinusitis, unspecified: Secondary | ICD-10-CM | POA: Diagnosis not present

## 2013-02-22 ENCOUNTER — Encounter: Payer: Self-pay | Admitting: Cardiovascular Disease

## 2013-02-26 ENCOUNTER — Encounter: Payer: Self-pay | Admitting: *Deleted

## 2013-02-26 ENCOUNTER — Other Ambulatory Visit: Payer: Self-pay | Admitting: Family Medicine

## 2013-02-27 ENCOUNTER — Encounter: Payer: Self-pay | Admitting: *Deleted

## 2013-02-28 DIAGNOSIS — D631 Anemia in chronic kidney disease: Secondary | ICD-10-CM | POA: Diagnosis not present

## 2013-02-28 DIAGNOSIS — I129 Hypertensive chronic kidney disease with stage 1 through stage 4 chronic kidney disease, or unspecified chronic kidney disease: Secondary | ICD-10-CM | POA: Diagnosis not present

## 2013-03-06 ENCOUNTER — Encounter: Payer: Self-pay | Admitting: *Deleted

## 2013-03-07 ENCOUNTER — Ambulatory Visit (INDEPENDENT_AMBULATORY_CARE_PROVIDER_SITE_OTHER): Payer: Medicare Other | Admitting: Cardiovascular Disease

## 2013-03-07 ENCOUNTER — Encounter: Payer: Self-pay | Admitting: Cardiovascular Disease

## 2013-03-07 VITALS — BP 110/70 | HR 56 | Ht 69.0 in | Wt 155.0 lb

## 2013-03-07 DIAGNOSIS — E7800 Pure hypercholesterolemia, unspecified: Secondary | ICD-10-CM

## 2013-03-07 DIAGNOSIS — I4949 Other premature depolarization: Secondary | ICD-10-CM | POA: Diagnosis not present

## 2013-03-07 DIAGNOSIS — I6529 Occlusion and stenosis of unspecified carotid artery: Secondary | ICD-10-CM

## 2013-03-07 DIAGNOSIS — I493 Ventricular premature depolarization: Secondary | ICD-10-CM

## 2013-03-07 DIAGNOSIS — I2510 Atherosclerotic heart disease of native coronary artery without angina pectoris: Secondary | ICD-10-CM

## 2013-03-07 DIAGNOSIS — I739 Peripheral vascular disease, unspecified: Secondary | ICD-10-CM | POA: Insufficient documentation

## 2013-03-07 MED ORDER — NITROGLYCERIN 0.4 MG SL SUBL: 0.4000 mg | SUBLINGUAL_TABLET | SUBLINGUAL | Status: DC | PRN

## 2013-03-07 NOTE — Assessment & Plan Note (Signed)
Patient is on a statin drug. His most recent lipid profile performed in May revealed a total cholesterol 114, LDL 44 and HDL of 37

## 2013-03-07 NOTE — Assessment & Plan Note (Signed)
Patient has largely Dopplers that showed moderate right common iliac and distal left SFA stenosis. This was last checked a year ago I will need to be rechecked. The patient denies claudication

## 2013-03-07 NOTE — Progress Notes (Signed)
03/07/2013 Jalene Mullet   [birth year]  [id number]  Primary Physician Nilda Simmer, MD Primary Cardiologist: Runell Gess MD Roseanne Reno   HPI:  The patient is a very pleasant 72 year old, thin and fit-appearing married Caucasian male with no children, who I last saw in the office six months ago. He has a history of CAD and PVOD. He had coronary artery bypass grafting in 2006 with a LIMA to his LAD, a vein to the RCA, circumflex, and diagonal branches. He did develop paroxysmal atrial fibrillation postoperatively. He has PVOD, status post right carotid endarterectomy performed by Dr. Woodfin Ganja, as well as a known aortoiliac and left SFA disease, but he denies claudication except after walking seven or eight blocks in his left calf. His other problems include treated hypertension and dyslipidemia. He had a catheterization July 13, 2010, and found to have patent grafts with moderate aortoiliac disease. He does have moderate renal insufficiency followed by Dr. Arrie Aran. He has had laparoscopic cholecystectomy that was uncomplicated in the past. His last lipid profile in 55/30/14 revealed a total cholesterol of 114, LDL 44 and HDL of 37. He did have a negative Myoview stress test recently had an event monitor that showed bigeminal PVCs which he is asymptomatic from.    Current Outpatient Prescriptions  Medication Sig Dispense Refill  . aspirin EC 81 MG tablet Take 81 mg by mouth at bedtime.      . Choline Fenofibrate (TRILIPIX) 45 MG capsule Take 45 mg by mouth at bedtime.       [name] esomeprazole (NEXIUM) 40 MG capsule Take 40 mg by mouth as needed.       . ezetimibe (ZETIA) 10 MG tablet Take 10 mg by mouth daily.      . furosemide (LASIX) 20 MG tablet Take 10 mg by mouth daily.      . metoprolol tartrate (LOPRESSOR) 25 MG tablet Take 0.5 tablets (12.5 mg total) by mouth 2 (two) times daily.  90 tablet  2  . NASONEX 50 MCG/ACT nasal spray as needed.      . nitroGLYCERIN  (NITROSTAT) 0.4 MG SL tablet Place 1 tablet (0.4 mg total) under the tongue every 5 (five) minutes as needed for chest pain.  25 tablet  11  . simvastatin (ZOCOR) 80 MG tablet Take 1 tablet (80 mg total) by mouth at bedtime.  30 tablet  6   No current facility-administered medications for this visit.    Allergies  Allergen Reactions  . Latex Rash    History   Social History  . Marital Status: Married    Spouse Name: [name]    Number of Children: 0  . Years of Education: N/A   Occupational History  . Retired     Product/process development scientist  .     Social History Main Topics  . Smoking status: Never Smoker   . Smokeless tobacco: Not on file  . Alcohol Use: Yes     Comment: socially  . Drug Use: No  . Sexual Activity: Yes   Other Topics Concern  . Not on file   Social History Narrative   Last name is pronounced "cas ka" No caffeine use. Always uses seat belts. Exercise:Moderate walks 30 min daily and rides a bike. Uses treadmill 20 mins everyday. Smoke alarm in home, No guns in the home.Married x 13 years: happily married. Most of family in [place].     Review of Systems: General: negative for chills, fever, night sweats or weight changes.  Cardiovascular:  negative for chest pain, dyspnea on exertion, edema, orthopnea, palpitations, paroxysmal nocturnal dyspnea or shortness of breath Dermatological: negative for rash Respiratory: negative for cough or wheezing Urologic: negative for hematuria Abdominal: negative for nausea, vomiting, diarrhea, bright red blood per rectum, melena, or hematemesis Neurologic: negative for visual changes, syncope, or dizziness All other systems reviewed and are otherwise negative except as noted above.    Blood pressure 110/70, pulse 56, height 5\' 9"  (1.753 m), weight 155 lb (70.308 kg).  General appearance: alert and no distress Neck: no adenopathy, no carotid bruit, no JVD, supple, symmetrical, trachea midline and thyroid not enlarged, symmetric, no  tenderness/mass/nodules Lungs: clear to auscultation bilaterally Heart: regular rate and rhythm, S1, S2 normal, no murmur, click, rub or gallop Extremities: extremities normal, atraumatic, no cyanosis or edema  EKG not performed today  ASSESSMENT AND PLAN:   Frequent unifocal PVCs The patient was noted to have a low effective heart rate and therefore an event monitor was performed and showed bigeminal unifocal PVCs. He is on a low-dose beta blocker is entirely asymptomatic. He denies fatigue or presyncope. We will continue to follow this.  Coronary artery disease Patient had a recent Myoview stress test that was entirely normal. He is 8 years status post coronary artery bypass grafting without symptoms.  Occlusion and stenosis of carotid artery without mention of cerebral infarction Status post right carotid endarterectomy performed by Dr. Woodfin Ganja remotely. His left carotid Dopplers performed a year ago showed no significant disease. He is scheduled for carotid Dopplers next year.  Pure hypercholesterolemia Patient is on a statin drug. His most recent lipid profile performed in May revealed a total cholesterol 114, LDL 44 and HDL of 37  Peripheral arterial disease Patient has largely Dopplers that showed moderate right common iliac and distal left SFA stenosis. This was last checked a year ago I will need to be rechecked. The patient denies claudication      Runell Gess MD [facility], [facility] 03/07/2013 9:24 AM

## 2013-03-07 NOTE — Assessment & Plan Note (Signed)
Patient had a recent Myoview stress test that was entirely normal. He is 8 years status post coronary artery bypass grafting without symptoms.

## 2013-03-07 NOTE — Patient Instructions (Addendum)
Your physician wants you to follow-up in: 1 year with Dr Berry. You will receive a reminder letter in the mail two months in advance. If you don't receive a letter, please call our office to schedule the follow-up appointment.  

## 2013-03-07 NOTE — Assessment & Plan Note (Signed)
The patient was noted to have a low effective heart rate and therefore an event monitor was performed and showed bigeminal unifocal PVCs. He is on a low-dose beta blocker is entirely asymptomatic. He denies fatigue or presyncope. We will continue to follow this.

## 2013-03-07 NOTE — Assessment & Plan Note (Signed)
Status post right carotid endarterectomy performed by Dr. Woodfin Ganja remotely. His left carotid Dopplers performed a year ago showed no significant disease. He is scheduled for carotid Dopplers next year.

## 2013-03-08 ENCOUNTER — Encounter (HOSPITAL_COMMUNITY): Payer: Self-pay | Admitting: *Deleted

## 2013-03-08 ENCOUNTER — Other Ambulatory Visit (HOSPITAL_COMMUNITY): Payer: Self-pay | Admitting: *Deleted

## 2013-03-12 DIAGNOSIS — I10 Essential (primary) hypertension: Secondary | ICD-10-CM | POA: Diagnosis not present

## 2013-03-12 DIAGNOSIS — D509 Iron deficiency anemia, unspecified: Secondary | ICD-10-CM | POA: Diagnosis not present

## 2013-03-12 DIAGNOSIS — N2581 Secondary hyperparathyroidism of renal origin: Secondary | ICD-10-CM | POA: Diagnosis not present

## 2013-03-15 ENCOUNTER — Other Ambulatory Visit: Payer: Self-pay | Admitting: *Deleted

## 2013-03-15 MED ORDER — EZETIMIBE 10 MG PO TABS: 10.0000 mg | ORAL_TABLET | Freq: Every day | ORAL | Status: DC

## 2013-04-10 ENCOUNTER — Telehealth (HOSPITAL_COMMUNITY): Payer: Self-pay | Admitting: *Deleted

## 2013-04-12 ENCOUNTER — Other Ambulatory Visit: Payer: Self-pay

## 2013-04-12 MED ORDER — CHOLINE FENOFIBRATE 45 MG PO CPDR: 45.0000 mg | DELAYED_RELEASE_CAPSULE | Freq: Every day | ORAL | Status: DC

## 2013-04-12 NOTE — Telephone Encounter (Signed)
Rx was sent to pharmacy electronically. 

## 2013-04-19 ENCOUNTER — Ambulatory Visit (INDEPENDENT_AMBULATORY_CARE_PROVIDER_SITE_OTHER): Payer: Medicare Other | Admitting: Family Medicine

## 2013-04-19 ENCOUNTER — Other Ambulatory Visit: Payer: Self-pay

## 2013-04-19 VITALS — BP 132/70 | HR 81 | Temp 98.1°F | Resp 16 | Ht 66.0 in | Wt 157.6 lb

## 2013-04-19 DIAGNOSIS — J069 Acute upper respiratory infection, unspecified: Secondary | ICD-10-CM

## 2013-04-19 DIAGNOSIS — H6120 Impacted cerumen, unspecified ear: Secondary | ICD-10-CM | POA: Diagnosis not present

## 2013-04-19 DIAGNOSIS — R05 Cough: Secondary | ICD-10-CM | POA: Diagnosis not present

## 2013-04-19 DIAGNOSIS — R059 Cough, unspecified: Secondary | ICD-10-CM

## 2013-04-19 DIAGNOSIS — H6121 Impacted cerumen, right ear: Secondary | ICD-10-CM

## 2013-04-19 MED ORDER — HYDROCODONE-HOMATROPINE 5-1.5 MG/5ML PO SYRP: 5.0000 mL | ORAL_SOLUTION | ORAL | Status: DC | PRN

## 2013-04-19 NOTE — Patient Instructions (Signed)
Drink plenty of fluids and get enough rest  Take the cough syrup, Hycodan, if needed for coughing  If you start bringing up a lot of purulent phlegm that me know. I think that you do not need any antibiotics at this point.  Return if worse

## 2013-04-19 NOTE — Progress Notes (Signed)
Subjective: 72 year old man with history of upper sparked or infection for the past 3 days. He has a little tickle in his throat which is slight sore. He has mild rhinorrhea. He had a little bit of blood and mucus from his nose a couple days ago. He is coughing a moderate amount. He has developed laryngitis. He wants the wax removed from his ears. He travels a good deal, recently got back from the [place]. He is married, but his wife is not ill. He is retired. He likes to walk on his treadmill for about 20 minutes every day. We discussed that that might be a little hard on the respiratory system for a few days.  Objective: Alert oriented male in no acute distress. His TMs are normal on the left but occluded by soft wax in the right. There is a little bit of wax on the left also.[name] Nose clear but a few sniffles. Throat without erythema. Neck supple without significant nodes. Voice is a little bit hoarse. Chest clear to auscultation.   Assessment: Viral URI Pharyngitis Cough Cerumen impaction right ear  Plan: Irrigate ear Treat symptomatically

## 2013-04-23 ENCOUNTER — Ambulatory Visit (HOSPITAL_COMMUNITY)
Admission: RE | Admit: 2013-04-23 | Discharge: 2013-04-23 | Disposition: A | Discharge status: Home / Self Care | Payer: Medicare Other | Source: Ambulatory Visit | Attending: Cardiovascular Disease | Admitting: Cardiovascular Disease

## 2013-04-23 DIAGNOSIS — I739 Peripheral vascular disease, unspecified: Secondary | ICD-10-CM | POA: Diagnosis not present

## 2013-04-23 DIAGNOSIS — I70219 Atherosclerosis of native arteries of extremities with intermittent claudication, unspecified extremity: Secondary | ICD-10-CM

## 2013-04-23 NOTE — Progress Notes (Signed)
Arterial Lower Ext. Duplex Completed. Calyn Sivils, BS, RDMS, RVT  

## 2013-04-28 ENCOUNTER — Other Ambulatory Visit: Payer: Self-pay | Admitting: Cardiovascular Disease

## 2013-04-30 ENCOUNTER — Encounter: Payer: Self-pay | Admitting: *Deleted

## 2013-04-30 ENCOUNTER — Telehealth: Payer: Self-pay | Admitting: *Deleted

## 2013-04-30 DIAGNOSIS — I739 Peripheral vascular disease, unspecified: Secondary | ICD-10-CM

## 2013-04-30 NOTE — Telephone Encounter (Signed)
Message copied by Marella Bile on Mon Apr 30, 2013  9:52 PM ------      Message from: Runell Gess      Created: Fri Apr 27, 2013  6:45 AM       No change from prior study. Repeat in 12 months if asymptomatic ------

## 2013-04-30 NOTE — Telephone Encounter (Signed)
Order placed for repeat lower extremity doppler in 1 year 

## 2013-05-08 ENCOUNTER — Encounter: Payer: Self-pay | Admitting: *Deleted

## 2013-07-17 ENCOUNTER — Other Ambulatory Visit: Payer: Self-pay | Admitting: Cardiovascular Disease

## 2013-07-17 NOTE — Telephone Encounter (Signed)
Rx was sent to pharmacy electronically. 

## 2013-09-11 DIAGNOSIS — D638 Anemia in other chronic diseases classified elsewhere: Secondary | ICD-10-CM | POA: Diagnosis not present

## 2013-09-11 DIAGNOSIS — N183 Chronic kidney disease, stage 3 unspecified: Secondary | ICD-10-CM | POA: Diagnosis not present

## 2013-09-12 DIAGNOSIS — I7090 Unspecified atherosclerosis: Secondary | ICD-10-CM | POA: Diagnosis not present

## 2013-09-12 DIAGNOSIS — I739 Peripheral vascular disease, unspecified: Secondary | ICD-10-CM | POA: Diagnosis not present

## 2013-09-12 DIAGNOSIS — N183 Chronic kidney disease, stage 3 unspecified: Secondary | ICD-10-CM | POA: Diagnosis not present

## 2013-09-12 DIAGNOSIS — I129 Hypertensive chronic kidney disease with stage 1 through stage 4 chronic kidney disease, or unspecified chronic kidney disease: Secondary | ICD-10-CM | POA: Diagnosis not present

## 2013-10-08 ENCOUNTER — Other Ambulatory Visit: Payer: Self-pay | Admitting: Cardiovascular Disease

## 2013-10-08 NOTE — Telephone Encounter (Signed)
Rx was sent to pharmacy electronically. 

## 2013-10-13 ENCOUNTER — Other Ambulatory Visit: Payer: Self-pay | Admitting: Cardiovascular Disease

## 2013-10-16 NOTE — Telephone Encounter (Signed)
Rx was sent to pharmacy electronically. 

## 2013-12-19 ENCOUNTER — Ambulatory Visit (INDEPENDENT_AMBULATORY_CARE_PROVIDER_SITE_OTHER): Payer: Medicare Other | Admitting: Family Medicine

## 2013-12-19 ENCOUNTER — Encounter: Payer: Self-pay | Admitting: Family Medicine

## 2013-12-19 VITALS — BP 119/72 | HR 64 | Temp 98.2°F | Resp 16 | Ht 66.5 in | Wt 156.8 lb

## 2013-12-19 DIAGNOSIS — Z125 Encounter for screening for malignant neoplasm of prostate: Secondary | ICD-10-CM | POA: Diagnosis not present

## 2013-12-19 DIAGNOSIS — H6123 Impacted cerumen, bilateral: Secondary | ICD-10-CM

## 2013-12-19 DIAGNOSIS — E7800 Pure hypercholesterolemia, unspecified: Secondary | ICD-10-CM

## 2013-12-19 DIAGNOSIS — Z0000 Encounter for general adult medical examination without abnormal findings: Secondary | ICD-10-CM

## 2013-12-19 DIAGNOSIS — I10 Essential (primary) hypertension: Secondary | ICD-10-CM

## 2013-12-19 DIAGNOSIS — A6000 Herpesviral infection of urogenital system, unspecified: Secondary | ICD-10-CM

## 2013-12-19 DIAGNOSIS — I2510 Atherosclerotic heart disease of native coronary artery without angina pectoris: Secondary | ICD-10-CM

## 2013-12-19 DIAGNOSIS — Z1211 Encounter for screening for malignant neoplasm of colon: Secondary | ICD-10-CM | POA: Diagnosis not present

## 2013-12-19 DIAGNOSIS — H6120 Impacted cerumen, unspecified ear: Secondary | ICD-10-CM

## 2013-12-19 LAB — POCT URINALYSIS DIPSTICK
Bilirubin, UA: NEGATIVE
Glucose, UA: NEGATIVE
KETONES UA: NEGATIVE
Leukocytes, UA: NEGATIVE
Nitrite, UA: NEGATIVE
Protein, UA: NEGATIVE
Spec Grav, UA: 1.015
UROBILINOGEN UA: 0.2
pH, UA: 6

## 2013-12-19 LAB — LIPID PANEL
CHOLESTEROL: 140 mg/dL (ref 0–200)
HDL: 46 mg/dL (ref >39)
LDL CALC: 66 mg/dL (ref 0–99)
TRIGLYCERIDES: 138 mg/dL (ref <150)
Total CHOL/HDL Ratio: 3 Ratio
VLDL: 28 mg/dL (ref 0–40)

## 2013-12-19 LAB — COMPLETE METABOLIC PANEL WITH GFR
ALT: 28 U/L (ref 0–53)
AST: 33 U/L (ref 0–37)
Albumin: 4.5 g/dL (ref 3.5–5.2)
Alkaline Phosphatase: 73 U/L (ref 39–117)
BILIRUBIN TOTAL: 0.6 mg/dL (ref 0.2–1.2)
BUN: 13 mg/dL (ref 6–23)
CO2: 26 mEq/L (ref 19–32)
CREATININE: 1.49 mg/dL — AB (ref 0.50–1.35)
Calcium: 9.5 mg/dL (ref 8.4–10.5)
Chloride: 104 mEq/L (ref 96–112)
GFR, Est African American: 53 mL/min — ABNORMAL LOW
GFR, Est Non African American: 46 mL/min — ABNORMAL LOW
Glucose, Bld: 83 mg/dL (ref 70–99)
Potassium: 4.9 mEq/L (ref 3.5–5.3)
Sodium: 139 mEq/L (ref 135–145)
Total Protein: 7 g/dL (ref 6.0–8.3)

## 2013-12-19 LAB — CBC WITH DIFFERENTIAL/PLATELET
BASOS PCT: 1 % (ref 0–1)
Basophils Absolute: 0 10*3/uL (ref 0.0–0.1)
EOS ABS: 0.1 10*3/uL (ref 0.0–0.7)
Eosinophils Relative: 3 % (ref 0–5)
HCT: 49 % (ref 39.0–52.0)
Hemoglobin: 16.9 g/dL (ref 13.0–17.0)
Lymphocytes Relative: 20 % (ref 12–46)
Lymphs Abs: 0.9 10*3/uL (ref 0.7–4.0)
MCH: 29.5 pg (ref 26.0–34.0)
MCHC: 34.5 g/dL (ref 30.0–36.0)
MCV: 85.5 fL (ref 78.0–100.0)
Monocytes Absolute: 0.5 10*3/uL (ref 0.1–1.0)
Monocytes Relative: 11 % (ref 3–12)
Neutro Abs: 3.1 10*3/uL (ref 1.7–7.7)
Neutrophils Relative %: 65 % (ref 43–77)
PLATELETS: 195 10*3/uL (ref 150–400)
RBC: 5.73 MIL/uL (ref 4.22–5.81)
RDW: 13.7 % (ref 11.5–15.5)
WBC: 4.7 10*3/uL (ref 4.0–10.5)

## 2013-12-19 MED ORDER — NITROGLYCERIN 0.4 MG SL SUBL: 0.4000 mg | SUBLINGUAL_TABLET | SUBLINGUAL | Status: DC | PRN

## 2013-12-19 MED ORDER — ACYCLOVIR 400 MG PO TABS: 400.0000 mg | ORAL_TABLET | Freq: Every day | ORAL | Status: DC

## 2013-12-19 NOTE — Progress Notes (Signed)
This chart was scribed for Jose Honour, MD by Elby Beck, Scribe. This patient was seen in room 23 and the patient's care was started at 10:19 AM.  Subjective:    Patient ID: [name], male    DOB: [birth year], 73 y.o.   MRN: [id number]  12/19/2013  Annual Exam, Hyperlipidemia and Hypertension   HPI  HPI Comments: [name] is a 73 y.o. male who presents to the [facility] for an annual physical and wellness examination. He states that he does not have any specific complaints or symptoms that he would like to discuss today. He states that he last had a Tdap booster in 02/2011. He states that he has not had a pneumonia vaccination and he does not want one today because he will be out of town soon. He states that he has not had a shingles vaccination and he does not want this today. He states that he does not regularly have flu vaccines.   He states that he see his Cardiologist about once a year. Every other year he has sonograms of his neck and legs.   He states that he has never had a colonoscopy. He states that he regularly sees his dentist and that he has an appointment to have a tooth removed in November 2015. He states that he plans on seeing his Optometrist in August 2015.   He states that he has been married 15 years, and he has no children. He states that he exercises by walking on the treadmill 20 minutes a day, almost every day, and that he sometimes walks outside as well.   He states that his only allergy is to Latex.   He states that he takes Aspirin and Trilipix daily. He states that he takes OTC Nexium as needed, rather than prescription Nexium, for financial reasons. He states that he no longer takes vitamin B12 daily.   He states that his blood pressures have been normal at home and that he regularly monitors this. He expresses concern that his pulse may be too high at times.   He denies any significant falls in the past year. He  states that he believes he has some wax build-up in his ears, and he has had decreased hearing due to this.   He reports that he has had tinnitus for about 60 years, with no recent changes. He states that he takes Lasix daily as prescribed. He denies any headaches, dizziness, chest pain, SOB, persistent cough, nausea, emesis, abdominal pain, sleep disturbance, depression, shoulder pain, neck pain, numbness or tingling in his arms, leg swelling or mouth sores. He states that he has noticed that he has been having non-bloody, watery stools since his cholecystectomy. He states that his bowel habits have otherwise been regular and he does not characterize these stools as diarrhea. He states that about every other night, he has to wake up at 3 or 4 AM to urinate, but that his bladder function has otherwise been normal. He states that he has noticed an intermittent rash near his rectum. He states that he occasionally has mild anxiety, but he is not concerned about this.     Review of Systems  HENT: Positive for tinnitus (chronic). Negative for mouth sores.   Respiratory: Negative for cough and shortness of breath.   Cardiovascular: Negative for chest pain and leg swelling.  Gastrointestinal: Negative for nausea, vomiting and abdominal pain.       Watery stools  Musculoskeletal:  Negative for neck pain.  Skin:       Intermittent rash near rectum  Neurological: Negative for dizziness, numbness and headaches.  Psychiatric/Behavioral: Negative for sleep disturbance.    Past Medical History  Diagnosis Date  . CAD (coronary artery disease)     HX CABG 2006 X 4; last nuc 11/2010 last cath 2012-wth 95% OM too small for intervention, treated medically   . Chronic renal insufficiency     glomerulonephritis 6269485.4  . Anemia, unspecified   . Lung mass   . Cerebrovascular disease, unspecified     hx CEA-RT; KNOWN AOTOILIAC 7 LT sfa disease  . Benign paroxysmal positional vertigo   . Esophageal reflux     . Peripheral vascular disease, unspecified   . Pure hypercholesterolemia   . Essential hypertension, benign   . Genital herpes, unspecified   . Carcinoma in situ of skin, site unspecified   . Congenital anomaly of tongue, unspecified   . Blood transfusion without reported diagnosis   . PAF (paroxysmal atrial fibrillation)     post CABG, maintaining SR  . Hx of echocardiogram 01/2005    EF 55% normal study  . History of stress test 06/2010   Past Surgical History  Procedure Laterality Date  . Coronary artery bypass graft  05/12/2005    LIMA-LAD, VG-DIAG; VG-LCX,OM; VG-RCA  . Carotid endarterectomy  2007    Right  . Cholecystectomy  07/2010  . Cardiac catheterization  06/2010    Allergies  Allergen Reactions  . Latex Rash   Current Outpatient Prescriptions  Medication Sig Dispense Refill  . acyclovir (ZOVIRAX) 400 MG tablet Take 1 tablet (400 mg total) by mouth 5 (five) times daily.  30 tablet  2  . aspirin EC 81 MG tablet Take 81 mg by mouth at bedtime.      . Choline Fenofibrate (TRILIPIX) 45 MG capsule Take 1 capsule (45 mg total) by mouth at bedtime.  30 capsule  2  . esomeprazole (NEXIUM) 40 MG capsule Take 40 mg by mouth as needed.       . ezetimibe (ZETIA) 10 MG tablet Take 1 tablet (10 mg total) by mouth daily.  30 tablet  11  . fenofibrate (TRICOR) 48 MG tablet Take 48 mg by mouth daily.      . furosemide (LASIX) 20 MG tablet Take 10 mg by mouth daily.      . metoprolol tartrate (LOPRESSOR) 25 MG tablet TAKE 0.5 TABLETS (12.5 MG TOTAL) BY MOUTH 2 (TWO) TIMES DAILY.  90 tablet  1  . nitroGLYCERIN (NITROSTAT) 0.4 MG SL tablet Place 1 tablet (0.4 mg total) under the tongue every 5 (five) minutes as needed for chest pain.  25 tablet  11  . simvastatin (ZOCOR) 80 MG tablet TAKE 1 TABLET (80 MG TOTAL) BY MOUTH AT BEDTIME.  90 tablet  1   No current facility-administered medications for this visit.   History   Social History  . Marital Status: Married    Spouse Name:  [name]    Number of Children: 0  . Years of Education: N/A   Occupational History  . Retired     Passenger transport manager  .     Social History Main Topics  . Smoking status: Never Smoker   . Smokeless tobacco: Not on file  . Alcohol Use: Yes     Comment: socially  . Drug Use: No  . Sexual Activity: Yes   Other Topics Concern  . Not on file   Social History  Narrative   Last name is pronounced "cas ka" No caffeine use. Always uses seat belts. Exercise:Moderate walks 30 min daily and rides a bike. Uses treadmill 20 mins everyday. Smoke alarm in home, No guns in the home.      Marital status:  Married x 15 years: happily married. Most of family in [place].      Children; none      Lives: with wife      Employment: retired.      Exercise:  Treadmill 20 minutes per day seven days per week.  Also walking throughout the day.   Family History  Problem Relation Age of Onset  . Diabetes Mother        Objective:    BP 119/72  Pulse 64  Temp(Src) 98.2 F (36.8 C) (Oral)  Resp 16  Ht 5' 6.5" (1.689 m)  Wt 156 lb 12.8 oz (71.124 kg)  BMI 24.93 kg/m2  SpO2 99%  Physical Exam  Nursing note and vitals reviewed. Constitutional: He is oriented to person, place, and time. He appears well-developed and well-nourished. No distress.  HENT:  Head: Normocephalic and atraumatic.  Right Ear: External ear normal.  Left Ear: External ear normal.  Nose: Nose normal.  Mouth/Throat: Oropharynx is clear and moist.  B cerumen impaction.  Eyes: Conjunctivae and EOM are normal. Pupils are equal, round, and reactive to light.  Neck: Normal range of motion. Neck supple. Carotid bruit is not present. No tracheal deviation present. No thyromegaly present.  Cardiovascular: Normal rate, regular rhythm, normal heart sounds and intact distal pulses.  Exam reveals no gallop and no friction rub.   No murmur heard. Pulmonary/Chest: Effort normal and breath sounds normal. No respiratory distress. He has no wheezes. He  has no rales.  Abdominal: Soft. Bowel sounds are normal. He exhibits no distension and no mass. There is no tenderness. There is no rebound and no guarding. Hernia confirmed negative in the right inguinal area and confirmed negative in the left inguinal area.  Genitourinary: Rectum normal, prostate normal, testes normal and penis normal. Prostate is not enlarged and not tender. Right testis shows no mass, no swelling and no tenderness. Left testis shows no mass, no swelling and no tenderness. Circumcised.  Musculoskeletal: Normal range of motion.       Right shoulder: Normal.       Left shoulder: Normal.       Cervical back: Normal.  Lymphadenopathy:    He has no cervical adenopathy.       Right: No inguinal adenopathy present.       Left: No inguinal adenopathy present.  Neurological: He is alert and oriented to person, place, and time. He has normal reflexes. No cranial nerve deficit. He exhibits normal muscle tone. Coordination normal.  Skin: Skin is warm and dry. No rash noted. He is not diaphoretic.  Psychiatric: He has a normal mood and affect. His behavior is normal. Judgment and thought content normal.   Results for orders placed in visit on 12/19/13  CBC WITH DIFFERENTIAL      Result Value Ref Range   WBC 4.7  4.0 - 10.5 K/uL   RBC 5.73  4.22 - 5.81 MIL/uL   Hemoglobin 16.9  13.0 - 17.0 g/dL   HCT 49.0  39.0 - 52.0 %   MCV 85.5  78.0 - 100.0 fL   MCH 29.5  26.0 - 34.0 pg   MCHC 34.5  30.0 - 36.0 g/dL   RDW 13.7  11.5 -  15.5 %   Platelets 195  150 - 400 K/uL   Neutrophils Relative % 65  43 - 77 %   Neutro Abs 3.1  1.7 - 7.7 K/uL   Lymphocytes Relative 20  12 - 46 %   Lymphs Abs 0.9  0.7 - 4.0 K/uL   Monocytes Relative 11  3 - 12 %   Monocytes Absolute 0.5  0.1 - 1.0 K/uL   Eosinophils Relative 3  0 - 5 %   Eosinophils Absolute 0.1  0.0 - 0.7 K/uL   Basophils Relative 1  0 - 1 %   Basophils Absolute 0.0  0.0 - 0.1 K/uL   Smear Review Criteria for review not met      COMPLETE METABOLIC PANEL WITH GFR      Result Value Ref Range   Sodium 139  135 - 145 mEq/L   Potassium 4.9  3.5 - 5.3 mEq/L   Chloride 104  96 - 112 mEq/L   CO2 26  19 - 32 mEq/L   Glucose, Bld 83  70 - 99 mg/dL   BUN 13  6 - 23 mg/dL   Creat 1.49 (*) 0.50 - 1.35 mg/dL   Total Bilirubin 0.6  0.2 - 1.2 mg/dL   Alkaline Phosphatase 73  39 - 117 U/L   AST 33  0 - 37 U/L   ALT 28  0 - 53 U/L   Total Protein 7.0  6.0 - 8.3 g/dL   Albumin 4.5  3.5 - 5.2 g/dL   Calcium 9.5  8.4 - 10.5 mg/dL   GFR, Est African American 53 (*)    GFR, Est Non African American 46 (*)   LIPID PANEL      Result Value Ref Range   Cholesterol 140  0 - 200 mg/dL   Triglycerides 138  <150 mg/dL   HDL 46  >39 mg/dL   Total CHOL/HDL Ratio 3.0     VLDL 28  0 - 40 mg/dL   LDL Cholesterol 66  0 - 99 mg/dL  PSA, MEDICARE      Result Value Ref Range   PSA 2.16  <=4.00 ng/mL  POCT URINALYSIS DIPSTICK      Result Value Ref Range   Color, UA yellow     Clarity, UA clear     Glucose, UA neg     Bilirubin, UA neg     Ketones, UA neg     Spec Grav, UA 1.015     Blood, UA trace     pH, UA 6.0     Protein, UA neg     Urobilinogen, UA 0.2     Nitrite, UA neg     Leukocytes, UA Negative     B EARS IRRIGATED BY WANDA.    Assessment & Plan:  Routine general medical examination at a health care facility  Pure hypercholesterolemia - Plan: fenofibrate (TRICOR) 48 MG tablet, CBC with Differential, COMPLETE METABOLIC PANEL WITH GFR, Lipid panel  Essential hypertension, benign - Plan: CBC with Differential, COMPLETE METABOLIC PANEL WITH GFR, PSA, Medicare, POCT urinalysis dipstick  Screening for colon cancer  Screening for prostate cancer - Plan: PSA, Medicare  Cerumen impaction, bilateral  Herpes, genital - Plan: acyclovir (ZOVIRAX) 400 MG tablet  Coronary artery disease involving native coronary artery of native heart without angina pectoris - Plan: nitroGLYCERIN (NITROSTAT) 0.4 MG SL tablet  1. Annual  Wellness Exam:  Anticipatory guidance --- recommend [id number] and Zostavax.  Recommend colonoscopy.  No falls  in the past year; no evidence of depression; no hearing loss; independent with ADLs.  Recommend continued daily exercise and weight maintenance.   2.  HTN: controlled; obtain labs, u/a.  Continue current medications. 3. Hyperlipidemia: controlled; obtain labs; continue current medications. 4.  Colon cancer screening: patient agreeable to refer for colonoscopy in six weeks after return from travel. 5.  Screening prostate cancer: s/p DRE: obtain PSA. 6.  Cerumen impaction B: recurrent; s/p B ear irrigation in office. 7. Herpes genital stable; refill of Acyclovir provided. 8.  CAD: stable; asymptomatic at this time; followed by cardiology annually; refill of NTG provided.  Meds ordered this encounter  Medications  . DISCONTD: vitamin B-12 (CYANOCOBALAMIN) 100 MCG tablet    Sig: Take 100 mcg by mouth daily.  . fenofibrate (TRICOR) 48 MG tablet    Sig: Take 48 mg by mouth daily.  [name] acyclovir (ZOVIRAX) 400 MG tablet    Sig: Take 1 tablet (400 mg total) by mouth 5 (five) times daily.    Dispense:  30 tablet    Refill:  2  . nitroGLYCERIN (NITROSTAT) 0.4 MG SL tablet    Sig: Place 1 tablet (0.4 mg total) under the tongue every 5 (five) minutes as needed for chest pain.    Dispense:  25 tablet    Refill:  11    Return in about 6 weeks (around 01/30/2014) for Prevnar, referral to colonoscopy.  I personally performed the services described in this documentation, which was scribed in my presence.  The recorded information has been reviewed and is accurate.  Reginia Forts, M.D.  [facility] [address] [place], [place]  [address] [phone] phone [phone] fax

## 2013-12-19 NOTE — Progress Notes (Signed)
   Subjective:    Patient ID: [name], male    DOB: [birth year], 73 y.o.   MRN: [id number]  HPI    Review of Systems  HENT: Positive for tinnitus.   Eyes: Negative.   Respiratory: Negative.   Cardiovascular: Negative.   Gastrointestinal: Negative.        Occasional rash on rectum  Endocrine: Negative.   Genitourinary: Negative.   Musculoskeletal: Negative.   Skin: Negative.   Allergic/Immunologic: Negative.   Neurological: Positive for facial asymmetry.  Hematological: Negative.   Psychiatric/Behavioral: Negative.        Objective:   Physical Exam        Assessment & Plan:

## 2013-12-20 LAB — PSA, MEDICARE: PSA: 2.16 ng/mL (ref <=4.00)

## 2013-12-24 ENCOUNTER — Other Ambulatory Visit: Payer: Self-pay | Admitting: *Deleted

## 2013-12-24 MED ORDER — CHOLINE FENOFIBRATE 45 MG PO CPDR: 45.0000 mg | DELAYED_RELEASE_CAPSULE | Freq: Every day | ORAL | Status: DC

## 2013-12-24 NOTE — Telephone Encounter (Signed)
Rx was sent to pharmacy electronically. 

## 2014-01-28 ENCOUNTER — Other Ambulatory Visit: Payer: Self-pay | Admitting: Cardiovascular Disease

## 2014-01-29 NOTE — Telephone Encounter (Signed)
Phoned in refill.  

## 2014-02-10 ENCOUNTER — Other Ambulatory Visit: Payer: Self-pay | Admitting: Cardiovascular Disease

## 2014-02-11 NOTE — Telephone Encounter (Signed)
Rx was sent to pharmacy electronically. 

## 2014-02-21 DIAGNOSIS — D1801 Hemangioma of skin and subcutaneous tissue: Secondary | ICD-10-CM | POA: Diagnosis not present

## 2014-02-21 DIAGNOSIS — D239 Other benign neoplasm of skin, unspecified: Secondary | ICD-10-CM | POA: Diagnosis not present

## 2014-02-21 DIAGNOSIS — L570 Actinic keratosis: Secondary | ICD-10-CM | POA: Diagnosis not present

## 2014-02-21 DIAGNOSIS — L821 Other seborrheic keratosis: Secondary | ICD-10-CM | POA: Diagnosis not present

## 2014-02-21 DIAGNOSIS — L738 Other specified follicular disorders: Secondary | ICD-10-CM | POA: Diagnosis not present

## 2014-02-21 DIAGNOSIS — L820 Inflamed seborrheic keratosis: Secondary | ICD-10-CM | POA: Diagnosis not present

## 2014-02-21 DIAGNOSIS — Z85828 Personal history of other malignant neoplasm of skin: Secondary | ICD-10-CM | POA: Diagnosis not present

## 2014-02-21 DIAGNOSIS — L678 Other hair color and hair shaft abnormalities: Secondary | ICD-10-CM | POA: Diagnosis not present

## 2014-02-26 DIAGNOSIS — H2510 Age-related nuclear cataract, unspecified eye: Secondary | ICD-10-CM | POA: Diagnosis not present

## 2014-02-26 DIAGNOSIS — H3530 Unspecified macular degeneration: Secondary | ICD-10-CM | POA: Diagnosis not present

## 2014-03-13 ENCOUNTER — Encounter: Payer: Self-pay | Admitting: Cardiovascular Disease

## 2014-03-13 ENCOUNTER — Ambulatory Visit (INDEPENDENT_AMBULATORY_CARE_PROVIDER_SITE_OTHER): Payer: Medicare Other | Admitting: Cardiovascular Disease

## 2014-03-13 VITALS — BP 120/70 | HR 57 | Ht 69.0 in | Wt 160.0 lb

## 2014-03-13 DIAGNOSIS — I10 Essential (primary) hypertension: Secondary | ICD-10-CM

## 2014-03-13 DIAGNOSIS — I6529 Occlusion and stenosis of unspecified carotid artery: Secondary | ICD-10-CM

## 2014-03-13 DIAGNOSIS — E785 Hyperlipidemia, unspecified: Secondary | ICD-10-CM

## 2014-03-13 DIAGNOSIS — I2510 Atherosclerotic heart disease of native coronary artery without angina pectoris: Secondary | ICD-10-CM

## 2014-03-13 DIAGNOSIS — I739 Peripheral vascular disease, unspecified: Secondary | ICD-10-CM

## 2014-03-13 NOTE — Patient Instructions (Signed)
Your physician wants you to follow-up in: 1 year with Dr Gwenlyn Found. You will receive a reminder letter in the mail two months in advance. If you don't receive a letter, please call our office to schedule the follow-up appointment.   Dr Gwenlyn Found would like for you to have lower extremity arterial dopplers 04/2015 and carotid dopplers in the near future.

## 2014-03-13 NOTE — Assessment & Plan Note (Signed)
History of right carotid endarterectomy by Dr. Gae Gallop remotely followed by duplex ultrasound

## 2014-03-13 NOTE — Assessment & Plan Note (Signed)
History of CAD status post coronary artery bypass grafting in 2006 the LIMA to his LAD, vein to the RCA, circumflex and diagonal branches. He had a cardiac catheterization performed 07/13/10 with some have patent grafts. A Myoview stress test performed last year which is nonischemic. He denies chest pain or shortness of breath.

## 2014-03-13 NOTE — Assessment & Plan Note (Signed)
On statin therapy , fenofibrate  and Zetia with a recent lipid profile performed 12/19/13 revealing a total cholesterol of 140, LDL of 66 and HDL 46.

## 2014-03-13 NOTE — Assessment & Plan Note (Signed)
History of mild peripheral arterial disease with Dopplers performed 04/23/13 revealing a right ABI of 0.9 to with a high-frequency signal in the right common iliac artery and a left ABI of .83  with a high-frequency signal in the distal left SFA. He really denies claudication.

## 2014-03-13 NOTE — Progress Notes (Signed)
03/13/2014 [name]   09-22-1940  [id number]  Primary Physician Reginia Forts, MD Primary Cardiologist: [name] MD Renae Gloss   HPI:  The patient is a very pleasant 73 year old, thin and fit-appearing married Caucasian male with no children, who I last saw in the office 21months ago. He has a history of CAD and PVOD. He had coronary artery bypass grafting in 2006 with a LIMA to his LAD, a vein to the RCA, circumflex, and diagonal branches. He did develop paroxysmal atrial fibrillation postoperatively. He has PVOD, status post right carotid endarterectomy performed by Dr. [name], as well as a known aortoiliac and left SFA disease, but he denies claudication except after walking seven or eight blocks in his left calf. His other problems include treated hypertension and dyslipidemia. He had a catheterization July 13, 2010, and found to have patent grafts with moderate aortoiliac disease. He does have moderate renal insufficiency followed by Dr. [name]. He has had laparoscopic cholecystectomy that was uncomplicated in the past. His last lipid profile in7/8/15 revealed a total cholesterol 140, LDL of 66 and HDL of 46. He did have a negative Myoview stress test last year.he has been asymptomatic since I saw him back one year ago.   Current Outpatient Prescriptions  Medication Sig Dispense Refill  . aspirin EC 81 MG tablet Take 81 mg by mouth at bedtime.      . Choline Fenofibrate (FENOFIBRIC ACID) 45 MG CPDR TAKE 1 CAPSULE (45MG ) BY MOUTH AT BEDTIME  30 capsule  1  . esomeprazole (NEXIUM) 40 MG capsule Take 40 mg by mouth as needed.       . furosemide (LASIX) 20 MG tablet Take 10 mg by mouth daily.      . metoprolol tartrate (LOPRESSOR) 25 MG tablet TAKE 0.5 TABLETS (12.5 MG TOTAL) BY MOUTH 2 (TWO) TIMES DAILY.  90 tablet  1  . nitroGLYCERIN (NITROSTAT) 0.4 MG SL tablet Place 1 tablet (0.4 mg total) under the tongue every 5 (five) minutes as needed for  chest pain.  25 tablet  11  . simvastatin (ZOCOR) 80 MG tablet TAKE 1 TABLET (80 MG TOTAL) BY MOUTH AT BEDTIME.  90 tablet  1  . ZETIA 10 MG tablet TAKE 1 TABLET (10 MG TOTAL) BY MOUTH DAILY.  30 tablet  0   No current facility-administered medications for this visit.    Allergies  Allergen Reactions  . Latex Rash    History   Social History  . Marital Status: Married    Spouse Name: [name]    Number of Children: 0  . Years of Education: N/A   Occupational History  . Retired     Passenger transport manager  .     Social History Main Topics  . Smoking status: Never Smoker   . Smokeless tobacco: Not on file  . Alcohol Use: Yes     Comment: socially  . Drug Use: No  . Sexual Activity: Yes   Other Topics Concern  . Not on file   Social History Narrative   Last name is pronounced "cas ka" No caffeine use. Always uses seat belts. Exercise:Moderate walks 30 min daily and rides a bike. Uses treadmill 20 mins everyday. Smoke alarm in home, No guns in the home.      Marital status:  Married x 15 years: happily married. Most of family in [place].      Children; none      Lives: with wife  Employment: retired.      Exercise:  Treadmill 20 minutes per day seven days per week.  Also walking throughout the day.     Review of Systems: General: negative for chills, fever, night sweats or weight changes.  Cardiovascular: negative for chest pain, dyspnea on exertion, edema, orthopnea, palpitations, paroxysmal nocturnal dyspnea or shortness of breath Dermatological: negative for rash Respiratory: negative for cough or wheezing Urologic: negative for hematuria Abdominal: negative for nausea, vomiting, diarrhea, bright red blood per rectum, melena, or hematemesis Neurologic: negative for visual changes, syncope, or dizziness All other systems reviewed and are otherwise negative except as noted above.    Blood pressure 120/70, pulse 57, height 5\' 9"  (1.753 m), weight 160 lb (72.576 kg).    General appearance: alert and no distress Neck: no adenopathy, no carotid bruit, no JVD, supple, symmetrical, trachea midline and thyroid not enlarged, symmetric, no tenderness/mass/nodules Lungs: clear to auscultation bilaterally Heart: regular rate and rhythm, S1, S2 normal, no murmur, click, rub or gallop Extremities: extremities normal, atraumatic, no cyanosis or edema  EKG sinus bradycardia of 57 without ST or T wave changes  ASSESSMENT AND PLAN:   HYPERLIPIDEMIA On statin therapy , fenofibrate  and Zetia with a recent lipid profile performed 12/19/13 revealing a total cholesterol of 140, LDL of 66 and HDL 46.  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 Gae Gallop remotely followed by duplex ultrasound  Peripheral arterial disease History of mild peripheral arterial disease with Dopplers performed 04/23/13 revealing a right ABI of 0.9 to with a high-frequency signal in the right common iliac artery and a left ABI of .83  with a high-frequency signal in the distal left SFA. He really denies claudication.  CAD (coronary artery disease), hx CABG in 2006.  last cath 2012 with patent grafts.  History of CAD status post coronary artery bypass grafting in 2006 the LIMA to his LAD, vein to the RCA, circumflex and diagonal branches. He had a cardiac catheterization performed 07/13/10 with some have patent grafts. A Myoview stress test performed last year which is nonischemic. He denies chest pain or shortness of breath.      [name] MD FACP,FACC,FAHA, [facility] 03/13/2014 11:34 AM

## 2014-03-13 NOTE — Assessment & Plan Note (Signed)
Controlled on current medications 

## 2014-03-29 ENCOUNTER — Other Ambulatory Visit: Payer: Self-pay | Admitting: Cardiovascular Disease

## 2014-03-29 NOTE — Telephone Encounter (Signed)
Rx was sent to pharmacy electronically. 

## 2014-04-09 ENCOUNTER — Telehealth (HOSPITAL_COMMUNITY): Payer: Self-pay | Admitting: *Deleted

## 2014-04-11 ENCOUNTER — Other Ambulatory Visit: Payer: Self-pay | Admitting: Cardiovascular Disease

## 2014-04-11 NOTE — Telephone Encounter (Signed)
Rx was sent to pharmacy electronically. 

## 2014-04-19 ENCOUNTER — Other Ambulatory Visit: Payer: Self-pay | Admitting: Cardiovascular Disease

## 2014-04-19 NOTE — Telephone Encounter (Signed)
Rx was sent to pharmacy electronically. 

## 2014-04-24 DIAGNOSIS — I7090 Unspecified atherosclerosis: Secondary | ICD-10-CM | POA: Diagnosis not present

## 2014-04-24 DIAGNOSIS — N183 Chronic kidney disease, stage 3 (moderate): Secondary | ICD-10-CM | POA: Diagnosis not present

## 2014-04-24 DIAGNOSIS — I129 Hypertensive chronic kidney disease with stage 1 through stage 4 chronic kidney disease, or unspecified chronic kidney disease: Secondary | ICD-10-CM | POA: Diagnosis not present

## 2014-04-24 DIAGNOSIS — I739 Peripheral vascular disease, unspecified: Secondary | ICD-10-CM | POA: Diagnosis not present

## 2014-04-29 ENCOUNTER — Ambulatory Visit (HOSPITAL_COMMUNITY)
Admission: RE | Admit: 2014-04-29 | Discharge: 2014-04-29 | Disposition: A | Discharge status: Home / Self Care | Payer: Medicare Other | Source: Ambulatory Visit | Attending: Internal Medicine | Admitting: Internal Medicine

## 2014-04-29 DIAGNOSIS — Z951 Presence of aortocoronary bypass graft: Secondary | ICD-10-CM | POA: Diagnosis not present

## 2014-04-29 DIAGNOSIS — I779 Disorder of arteries and arterioles, unspecified: Secondary | ICD-10-CM | POA: Diagnosis not present

## 2014-04-29 DIAGNOSIS — I739 Peripheral vascular disease, unspecified: Secondary | ICD-10-CM | POA: Insufficient documentation

## 2014-04-29 DIAGNOSIS — I672 Cerebral atherosclerosis: Secondary | ICD-10-CM | POA: Insufficient documentation

## 2014-04-29 DIAGNOSIS — I6529 Occlusion and stenosis of unspecified carotid artery: Secondary | ICD-10-CM | POA: Diagnosis not present

## 2014-04-29 NOTE — Progress Notes (Signed)
Carotid Duplex Completed. Damoni Erker, BS, RDMS, RVT  

## 2014-05-03 ENCOUNTER — Telehealth: Payer: Self-pay | Admitting: *Deleted

## 2014-05-03 NOTE — Telephone Encounter (Signed)
Spoke to pt about results of Carotid Dopplers. Mild progression in disease on Left. Pt aware of study repeat in 24 months.

## 2014-05-03 NOTE — Telephone Encounter (Signed)
-----   Message from [name], MD sent at 05/02/2014  6:44 AM EST ----- Mild progression of disease on left. Repeat 24 months

## 2014-05-16 ENCOUNTER — Other Ambulatory Visit: Payer: Self-pay | Admitting: Cardiovascular Disease

## 2014-05-16 NOTE — Telephone Encounter (Signed)
Refill denied  Prescription filled 04/19/14

## 2014-09-10 DIAGNOSIS — H6123 Impacted cerumen, bilateral: Secondary | ICD-10-CM | POA: Diagnosis not present

## 2014-09-10 DIAGNOSIS — H902 Conductive hearing loss, unspecified: Secondary | ICD-10-CM | POA: Diagnosis not present

## 2014-09-16 ENCOUNTER — Ambulatory Visit (INDEPENDENT_AMBULATORY_CARE_PROVIDER_SITE_OTHER): Payer: Medicare Other | Admitting: Family Medicine

## 2014-09-16 VITALS — BP 112/80 | HR 80 | Temp 98.4°F | Resp 16 | Ht 68.0 in | Wt 159.6 lb

## 2014-09-16 DIAGNOSIS — N039 Chronic nephritic syndrome with unspecified morphologic changes: Secondary | ICD-10-CM | POA: Diagnosis not present

## 2014-09-16 DIAGNOSIS — J069 Acute upper respiratory infection, unspecified: Secondary | ICD-10-CM | POA: Diagnosis not present

## 2014-09-16 DIAGNOSIS — N289 Disorder of kidney and ureter, unspecified: Secondary | ICD-10-CM

## 2014-09-16 DIAGNOSIS — I25810 Atherosclerosis of coronary artery bypass graft(s) without angina pectoris: Secondary | ICD-10-CM

## 2014-09-16 MED ORDER — AMOXICILLIN-POT CLAVULANATE 875-125 MG PO TABS: 1.0000 | ORAL_TABLET | Freq: Two times a day (BID) | ORAL | Status: DC

## 2014-09-16 MED ORDER — BENZONATATE 100 MG PO CAPS: 100.0000 mg | ORAL_CAPSULE | Freq: Three times a day (TID) | ORAL | Status: DC | PRN

## 2014-09-16 NOTE — Patient Instructions (Signed)
Upper Respiratory Infection, Adult An upper respiratory infection (URI) is also sometimes known as the common cold. The upper respiratory tract includes the nose, sinuses, throat, trachea, and bronchi. Bronchi are the airways leading to the lungs. Most people improve within 1 week, but symptoms can last up to 2 weeks. A residual cough may last even longer.  CAUSES Many different viruses can infect the tissues lining the upper respiratory tract. The tissues become irritated and inflamed and often become very moist. Mucus production is also common. A cold is contagious. You can easily spread the virus to others by oral contact. This includes kissing, sharing a glass, coughing, or sneezing. Touching your mouth or nose and then touching a surface, which is then touched by another person, can also spread the virus. SYMPTOMS  Symptoms typically develop 1 to 3 days after you come in contact with a cold virus. Symptoms vary from person to person. They may include:  Runny nose.  Sneezing.  Nasal congestion.  Sinus irritation.  Sore throat.  Loss of voice (laryngitis).  Cough.  Fatigue.  Muscle aches.  Loss of appetite.  Headache.  Low-grade fever. DIAGNOSIS  You might diagnose your own cold based on familiar symptoms, since most people get a cold 2 to 3 times a year. Your caregiver can confirm this based on your exam. Most importantly, your caregiver can check that your symptoms are not due to another disease such as strep throat, sinusitis, pneumonia, asthma, or epiglottitis. Blood tests, throat tests, and X-rays are not necessary to diagnose a common cold, but they may sometimes be helpful in excluding other more serious diseases. Your caregiver will decide if any further tests are required. RISKS AND COMPLICATIONS  You may be at risk for a more severe case of the common cold if you smoke cigarettes, have chronic heart disease (such as heart failure) or lung disease (such as asthma), or if  you have a weakened immune system. The very young and very old are also at risk for more serious infections. Bacterial sinusitis, middle ear infections, and bacterial pneumonia can complicate the common cold. The common cold can worsen asthma and chronic obstructive pulmonary disease (COPD). Sometimes, these complications can require emergency medical care and may be life-threatening. PREVENTION  The best way to protect against getting a cold is to practice good hygiene. Avoid oral or hand contact with people with cold symptoms. Wash your hands often if contact occurs. There is no clear evidence that vitamin C, vitamin E, echinacea, or exercise reduces the chance of developing a cold. However, it is always recommended to get plenty of rest and practice good nutrition. TREATMENT  Treatment is directed at relieving symptoms. There is no cure. Antibiotics are not effective, because the infection is caused by a virus, not by bacteria. Treatment may include:  Increased fluid intake. Sports drinks offer valuable electrolytes, sugars, and fluids.  Breathing heated mist or steam (vaporizer or shower).  Eating chicken soup or other clear broths, and maintaining good nutrition.  Getting plenty of rest.  Using gargles or lozenges for comfort.  Controlling fevers with ibuprofen or acetaminophen as directed by your caregiver.  Increasing usage of your inhaler if you have asthma. Zinc gel and zinc lozenges, taken in the first 24 hours of the common cold, can shorten the duration and lessen the severity of symptoms. Pain medicines may help with fever, muscle aches, and throat pain. A variety of non-prescription medicines are available to treat congestion and runny nose. Your caregiver   can make recommendations and may suggest nasal or lung inhalers for other symptoms.  HOME CARE INSTRUCTIONS   Only take over-the-counter or prescription medicines for pain, discomfort, or fever as directed by your  caregiver.  Use a warm mist humidifier or inhale steam from a shower to increase air moisture. This may keep secretions moist and make it easier to breathe.  Drink enough water and fluids to keep your urine clear or pale yellow.  Rest as needed.  Return to work when your temperature has returned to normal or as your caregiver advises. You may need to stay home longer to avoid infecting others. You can also use a face mask and careful hand washing to prevent spread of the virus. SEEK MEDICAL CARE IF:   After the first few days, you feel you are getting worse rather than better.  You need your caregiver's advice about medicines to control symptoms.  You develop chills, worsening shortness of breath, or brown or red sputum. These may be signs of pneumonia.  You develop yellow or brown nasal discharge or pain in the face, especially when you bend forward. These may be signs of sinusitis.  You develop a fever, swollen neck glands, pain with swallowing, or white areas in the back of your throat. These may be signs of strep throat. SEEK IMMEDIATE MEDICAL CARE IF:   You have a fever.  You develop severe or persistent headache, ear pain, sinus pain, or chest pain.  You develop wheezing, a prolonged cough, cough up blood, or have a change in your usual mucus (if you have chronic lung disease).  You develop sore muscles or a stiff neck. Document Released: 11/24/2000 Document Revised: 08/23/2011 Document Reviewed: 09/05/2013 [employer] Patient Information 2015 [employer]. This information is not intended to replace advice given to you by your health care provider. Make sure you discuss any questions you have with your health care provider.  

## 2014-09-16 NOTE — Progress Notes (Signed)
Subjective:    Patient ID: [name], male    DOB: Dec 19, 1940, 74 y.o.   MRN: [id number]  09/16/2014  Nasal Congestion and Cough   HPI This 74 y.o. male presents for evaluation of nasal congestion and cough.   Onset last week.  No fever but +chills; no sweats.  Mild headache.  No sore throat.  No ear pain.  No body aches.  Getting ready to go on trip this week.  Onset four days ago.  Now wife has sickness. Slight rhinorrhea; +nasal congestion.  +coughing; really harsh cough in beginning; painful cough and laugh; now improved.  Exercised tody on treadmill.  No SOB.  No sputum.  No n/v/d.    BP dropped but HR increased to 80-90s.  Now returning to baseline.   Mild diarrhea with onset; now resolved.  No medications other than ASA.   Plans to leave Wednesday/two days.  Feeling a little better now but leaving in two days; cough can linger.   Review of Systems  Constitutional: Positive for chills and fatigue. Negative for fever and diaphoresis.  HENT: Positive for congestion and rhinorrhea. Negative for ear pain, sore throat, trouble swallowing and voice change.   Respiratory: Positive for cough. Negative for shortness of breath and wheezing.   Cardiovascular: Negative for chest pain and leg swelling.  Gastrointestinal: Negative for nausea, vomiting, abdominal pain and diarrhea.  Skin: Negative for rash.  Neurological: Positive for headaches. Negative for dizziness and light-headedness.    Past Medical History  Diagnosis Date  . CAD (coronary artery disease)     HX CABG 2006 X 4; last nuc 11/2010 last cath 2012-wth 95% OM too small for intervention, treated medically   . Chronic renal insufficiency     glomerulonephritis 9629528.4  . Anemia, unspecified   . Lung mass   . Cerebrovascular disease, unspecified     hx CEA-RT; KNOWN AOTOILIAC 7 LT sfa disease  . Benign paroxysmal positional vertigo   . Esophageal reflux   . Peripheral vascular disease, unspecified   . Pure  hypercholesterolemia   . Essential hypertension, benign   . Genital herpes, unspecified   . Carcinoma in situ of skin, site unspecified   . Congenital anomaly of tongue, unspecified   . Blood transfusion without reported diagnosis   . PAF (paroxysmal atrial fibrillation)     post CABG, maintaining SR  . Hx of echocardiogram 01/2005    EF 55% normal study  . History of stress test 06/2010   Past Surgical History  Procedure Laterality Date  . Coronary artery bypass graft  05/12/2005    LIMA-LAD, VG-DIAG; VG-LCX,OM; VG-RCA  . Carotid endarterectomy  2007    Right  . Cholecystectomy  07/2010  . Cardiac catheterization  06/2010   Allergies  Allergen Reactions  . Latex Rash   Current Outpatient Prescriptions  Medication Sig Dispense Refill  . aspirin EC 81 MG tablet Take 81 mg by mouth at bedtime.    . Choline Fenofibrate (FENOFIBRIC ACID) 45 MG CPDR TAKE 1 CAPSULE (45MG ) BY MOUTH AT BEDTIME 30 capsule 11  . esomeprazole (NEXIUM) 40 MG capsule Take 40 mg by mouth as needed.     . furosemide (LASIX) 20 MG tablet Take 10 mg by mouth daily.    . metoprolol tartrate (LOPRESSOR) 25 MG tablet TAKE 0.5 TABLETS (12.5 MG TOTAL) BY MOUTH 2 (TWO) TIMES DAILY. 90 tablet 1  . metoprolol tartrate (LOPRESSOR) 25 MG tablet TAKE 0.5 TABLETS (12.5 MG TOTAL) BY MOUTH  2 (TWO) TIMES DAILY. 90 tablet 3  . nitroGLYCERIN (NITROSTAT) 0.4 MG SL tablet Place 1 tablet (0.4 mg total) under the tongue every 5 (five) minutes as needed for chest pain. 25 tablet 11  . simvastatin (ZOCOR) 80 MG tablet TAKE 1 TABLET (80 MG TOTAL) BY MOUTH AT BEDTIME. 90 tablet 3  . ZETIA 10 MG tablet TAKE 1 TABLET (10 MG TOTAL) BY MOUTH DAILY. 30 tablet 10  . amoxicillin-clavulanate (AUGMENTIN) 875-125 MG per tablet Take 1 tablet by mouth 2 (two) times daily. 20 tablet 0  . benzonatate (TESSALON) 100 MG capsule Take 1-2 capsules (100-200 mg total) by mouth 3 (three) times daily as needed for cough. 40 capsule 0   No current  facility-administered medications for this visit.       Objective:    BP 112/80 mmHg  Pulse 80  Temp(Src) 98.4 F (36.9 C) (Oral)  Resp 16  Ht 5\' 8"  (1.727 m)  Wt 159 lb 9.6 oz (72.394 kg)  BMI 24.27 kg/m2  SpO2 99% Physical Exam  Constitutional: He is oriented to person, place, and time. He appears well-developed and well-nourished. No distress.  HENT:  Head: Normocephalic and atraumatic.  Right Ear: Tympanic membrane, external ear and ear canal normal.  Left Ear: Tympanic membrane, external ear and ear canal normal.  Nose: Mucosal edema and rhinorrhea present. Right sinus exhibits no maxillary sinus tenderness and no frontal sinus tenderness. Left sinus exhibits no maxillary sinus tenderness and no frontal sinus tenderness.  Mouth/Throat: Oropharynx is clear and moist.  Cardiovascular: Normal rate, regular rhythm and normal heart sounds.   No murmur heard. Pulmonary/Chest: Effort normal and breath sounds normal. No respiratory distress. He has no wheezes. He has no rales.  Neurological: He is alert and oriented to person, place, and time.  Skin: No rash noted. He is not diaphoretic.  Psychiatric: He has a normal mood and affect. His behavior is normal.        Assessment & Plan:   1. Acute upper respiratory infection   2. Coronary artery disease involving coronary bypass graft of native heart without angina pectoris   3. Disorder of kidney and ureter   4. Glomerulonephritis, chronic      -New. -Due to multiple comorbidities, treat with Augmentin, Tessalon Perles. -RTC for SOB, acute worsening.    Meds ordered this encounter  Medications  . amoxicillin-clavulanate (AUGMENTIN) 875-125 MG per tablet    Sig: Take 1 tablet by mouth 2 (two) times daily.    Dispense:  20 tablet    Refill:  0  . benzonatate (TESSALON) 100 MG capsule    Sig: Take 1-2 capsules (100-200 mg total) by mouth 3 (three) times daily as needed for cough.    Dispense:  40 capsule    Refill:  0      No Follow-up on file.     Hadlyn Amero Elayne Guerin, M.D. [facility] [address] [place], [place]  [id number] [phone] phone [phone] fax

## 2014-11-07 ENCOUNTER — Ambulatory Visit (INDEPENDENT_AMBULATORY_CARE_PROVIDER_SITE_OTHER): Payer: Medicare Other | Admitting: Physician Assistant

## 2014-11-07 VITALS — BP 132/70 | HR 84 | Temp 98.7°F | Resp 20 | Ht 62.25 in | Wt 156.5 lb

## 2014-11-07 DIAGNOSIS — R599 Enlarged lymph nodes, unspecified: Secondary | ICD-10-CM | POA: Diagnosis not present

## 2014-11-07 DIAGNOSIS — R590 Localized enlarged lymph nodes: Secondary | ICD-10-CM

## 2014-11-07 DIAGNOSIS — B3749 Other urogenital candidiasis: Secondary | ICD-10-CM

## 2014-11-07 DIAGNOSIS — I25810 Atherosclerosis of coronary artery bypass graft(s) without angina pectoris: Secondary | ICD-10-CM | POA: Diagnosis not present

## 2014-11-07 MED ORDER — NYSTATIN 100000 UNIT/GM EX POWD: CUTANEOUS | Status: DC

## 2014-11-07 NOTE — Progress Notes (Signed)
   Subjective:    Patient ID: [name], male    DOB: 12/13/1940, 74 y.o.   MRN: [id number]  Chief Complaint  Patient presents with  . Inguinal Hernia    ?on the left side.  been there about 1 month.  denies any pain   Patient Active Problem List   Diagnosis Date Noted  . Peripheral arterial disease 03/07/2013  . Bradycardia 02/05/2013  . 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 CAD (coronary artery disease), hx CABG in 2006.  last cath 2012 with patent grafts.  02/05/2013  . Essential hypertension, benign 09/04/2012  . Pure hypercholesterolemia 09/04/2012  . Abdominal pain, epigastric 09/04/2012  . Liver function test abnormality 09/04/2012  . Coronary artery disease 09/04/2012  . 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 HYPERLIPIDEMIA 11/24/2009  . Disorder of kidney and ureter 11/24/2009  . CT, CHEST, ABNORMAL 11/24/2009   Medications, allergies, past medical history, surgical history, family history, social history and problem list reviewed and updated.  HPI  74 yom presents with left groin swelling.    Sx started with itching left groin approx one wk ago. Had been out in the woods so thought maybe had a tick bite but did not see one. When he reached down to scratch noticed bulge left groin. Not painful. Not draining.   Has never had hernias in past. Denies fevers, chills.   Review of Systems No testicular pain, leg pain or lesions, penile pain, penile dc, dysuria, hematuria, abd pain, n/v, diarrhea.     Objective:   Physical Exam  Constitutional: He is oriented to person, place, and time. He appears well-developed and well-nourished.  Non-toxic appearance. He does not have a sickly appearance. He does not appear ill. No distress.  BP 132/70 mmHg  Pulse 84  Temp(Src) 98.7 F (37.1 C) (Oral)  Resp 20  Ht 5' 2.25" (1.581 m)  Wt 156 lb 8 oz (70.988 kg)  BMI 28.40 kg/m2  SpO2 98%   Abdominal: Hernia confirmed  negative in the right inguinal area and confirmed negative in the left inguinal area.  Genitourinary: Testes normal and penis normal. Right testis shows no mass, no swelling and no tenderness. Left testis shows no mass, no swelling and no tenderness. No penile tenderness. No discharge found.  Bilateral inguinal LAN. Erythema noted bilateral groin creases.   Lymphadenopathy:       Right: Inguinal adenopathy present.       Left: Inguinal adenopathy present.  Neurological: He is alert and oriented to person, place, and time.      Assessment & Plan:   74 yom presents with left groin swelling.   Inguinal lymphadenopathy Candidiasis of urogenital site - Plan: nystatin (MYCOSTATIN/NYSTOP) 100000 UNIT/GM POWD --bilateral inguinal LAN, no signs of hernia --LAN likely secondary to bilatareal yeast infection groin --nystatin bid, work to control moisture in area --rtc if not improved or if LAN not resolved after returning from vacation in one month  Julieta Gutting, PA-C Physician Assistant-Certified [facility]  11/08/2014 8:17 AM

## 2014-11-07 NOTE — Patient Instructions (Signed)
Please apply the nystatin topical twice daily until the area is improved. Please come back to see [place] if the redness or the swollen lymph nodes aren't improved when you return from your trip.   Cutaneous Candidiasis Cutaneous candidiasis is a condition in which there is an overgrowth of yeast (candida) on the skin. Yeast normally live on the skin, but in small enough numbers not to cause any symptoms. In certain cases, increased growth of the yeast may cause an actual yeast infection. This kind of infection usually occurs in areas of the skin that are constantly warm and moist, such as the armpits or the groin. Yeast is the most common cause of diaper rash in babies and in people who cannot control their bowel movements (incontinence). CAUSES  The fungus that most often causes cutaneous candidiasis is Candida albicans. Conditions that can increase the risk of getting a yeast infection of the skin include:  Obesity.  Pregnancy.  Diabetes.  Taking antibiotic medicine.  Taking birth control pills.  Taking steroid medicines.  Thyroid disease.  An iron or zinc deficiency.  Problems with the immune system. SYMPTOMS   Red, swollen area of the skin.  Bumps on the skin.  Itchiness. DIAGNOSIS  The diagnosis of cutaneous candidiasis is usually based on its appearance. Light scrapings of the skin may also be taken and viewed under a microscope to identify the presence of yeast. TREATMENT  Antifungal creams may be applied to the infected skin. In severe cases, oral medicines may be needed.  HOME CARE INSTRUCTIONS   Keep your skin clean and dry.  Maintain a healthy weight.  If you have diabetes, keep your blood sugar under control. SEEK IMMEDIATE MEDICAL CARE IF:  Your rash continues to spread despite treatment.  You have a fever, chills, or abdominal pain. Document Released: 02/16/2011 Document Revised: 08/23/2011 Document Reviewed: 02/16/2011 [facility] Patient Information 2015  [place], [place]. This information is not intended to replace advice given to you by your health care provider. Make sure you discuss any questions you have with your health care provider.

## 2014-11-13 DIAGNOSIS — I7090 Unspecified atherosclerosis: Secondary | ICD-10-CM | POA: Diagnosis not present

## 2014-11-13 DIAGNOSIS — N013 Rapidly progressive nephritic syndrome with diffuse mesangial proliferative glomerulonephritis: Secondary | ICD-10-CM | POA: Diagnosis not present

## 2014-11-13 DIAGNOSIS — N183 Chronic kidney disease, stage 3 (moderate): Secondary | ICD-10-CM | POA: Diagnosis not present

## 2014-11-13 DIAGNOSIS — I129 Hypertensive chronic kidney disease with stage 1 through stage 4 chronic kidney disease, or unspecified chronic kidney disease: Secondary | ICD-10-CM | POA: Diagnosis not present

## 2015-02-07 DIAGNOSIS — L812 Freckles: Secondary | ICD-10-CM | POA: Diagnosis not present

## 2015-02-07 DIAGNOSIS — D0471 Carcinoma in situ of skin of right lower limb, including hip: Secondary | ICD-10-CM | POA: Diagnosis not present

## 2015-02-07 DIAGNOSIS — Z85828 Personal history of other malignant neoplasm of skin: Secondary | ICD-10-CM | POA: Diagnosis not present

## 2015-02-07 DIAGNOSIS — D225 Melanocytic nevi of trunk: Secondary | ICD-10-CM | POA: Diagnosis not present

## 2015-02-07 DIAGNOSIS — D1801 Hemangioma of skin and subcutaneous tissue: Secondary | ICD-10-CM | POA: Diagnosis not present

## 2015-02-07 DIAGNOSIS — L570 Actinic keratosis: Secondary | ICD-10-CM | POA: Diagnosis not present

## 2015-02-07 DIAGNOSIS — L820 Inflamed seborrheic keratosis: Secondary | ICD-10-CM | POA: Diagnosis not present

## 2015-02-07 DIAGNOSIS — L821 Other seborrheic keratosis: Secondary | ICD-10-CM | POA: Diagnosis not present

## 2015-02-07 DIAGNOSIS — D485 Neoplasm of uncertain behavior of skin: Secondary | ICD-10-CM | POA: Diagnosis not present

## 2015-02-07 DIAGNOSIS — D2272 Melanocytic nevi of left lower limb, including hip: Secondary | ICD-10-CM | POA: Diagnosis not present

## 2015-02-07 DIAGNOSIS — D2271 Melanocytic nevi of right lower limb, including hip: Secondary | ICD-10-CM | POA: Diagnosis not present

## 2015-03-11 DIAGNOSIS — D638 Anemia in other chronic diseases classified elsewhere: Secondary | ICD-10-CM | POA: Diagnosis not present

## 2015-03-11 DIAGNOSIS — N183 Chronic kidney disease, stage 3 (moderate): Secondary | ICD-10-CM | POA: Diagnosis not present

## 2015-03-11 DIAGNOSIS — E785 Hyperlipidemia, unspecified: Secondary | ICD-10-CM | POA: Diagnosis not present

## 2015-03-13 ENCOUNTER — Other Ambulatory Visit: Payer: Self-pay | Admitting: Cardiovascular Disease

## 2015-03-13 NOTE — Telephone Encounter (Signed)
REFILL 

## 2015-05-17 ENCOUNTER — Other Ambulatory Visit: Payer: Self-pay | Admitting: Cardiovascular Disease

## 2015-05-19 ENCOUNTER — Other Ambulatory Visit: Payer: Self-pay | Admitting: Cardiovascular Disease

## 2015-05-20 ENCOUNTER — Other Ambulatory Visit: Payer: Self-pay | Admitting: Cardiovascular Disease

## 2015-05-20 NOTE — Telephone Encounter (Signed)
Please fax refill to the pharmacy [phone] not his home.

## 2015-05-20 NOTE — Telephone Encounter (Signed)
REFILL 

## 2015-05-20 NOTE — Telephone Encounter (Signed)
°*  STAT* If patient is at the pharmacy, call can be transferred to refill team.   1. Which medications need to be refilled? (please list name of each medication and dose if known) Zetia-still have not received it  2. Which pharmacy/location (including street and city if local pharmacy) is medication to be sent to?[facility] [phone]  3. Do they need a 30 day or 90 day supply? 90 and refills

## 2015-05-21 ENCOUNTER — Ambulatory Visit (INDEPENDENT_AMBULATORY_CARE_PROVIDER_SITE_OTHER): Payer: Medicare Other | Admitting: Cardiovascular Disease

## 2015-05-21 ENCOUNTER — Encounter: Payer: Self-pay | Admitting: Cardiovascular Disease

## 2015-05-21 VITALS — BP 126/74 | HR 58 | Ht 69.0 in | Wt 157.0 lb

## 2015-05-21 DIAGNOSIS — I739 Peripheral vascular disease, unspecified: Secondary | ICD-10-CM

## 2015-05-21 DIAGNOSIS — I2510 Atherosclerotic heart disease of native coronary artery without angina pectoris: Secondary | ICD-10-CM | POA: Diagnosis not present

## 2015-05-21 DIAGNOSIS — I10 Essential (primary) hypertension: Secondary | ICD-10-CM | POA: Diagnosis not present

## 2015-05-21 DIAGNOSIS — I2583 Coronary atherosclerosis due to lipid rich plaque: Secondary | ICD-10-CM

## 2015-05-21 MED ORDER — EZETIMIBE 10 MG PO TABS: 10.0000 mg | ORAL_TABLET | Freq: Every day | ORAL | Status: DC

## 2015-05-21 NOTE — Assessment & Plan Note (Signed)
History of coronary artery disease status post bypass graft in 2006 mm LAD, vein graft to the RCA, circumflex and diagonal branches. He required catheterization performed 07/13/10 was found to have patent grafts with moderate aortoiliac disease. He had a negative Myoview stress test 2 years ago. He denies chest pain or shortness of breath.

## 2015-05-21 NOTE — Assessment & Plan Note (Signed)
History of hypertension blood pressure measured at 126/74. He is on metoprolol. Continue current meds at current dosing

## 2015-05-21 NOTE — Assessment & Plan Note (Signed)
History of hyperlipidemia on simvastatin and Zetia. We will recheck a lipid and liver profile

## 2015-05-21 NOTE — Assessment & Plan Note (Signed)
History of right carotid endarterectomy with carotid Dopplers performed 04/29/14 revealing a widely patent endarterectomy site and moderate left ICA stenosis. He is neurologically asymptomatic. We will recheck carotid Dopplers on him in 1 year

## 2015-05-21 NOTE — Assessment & Plan Note (Signed)
History of hyperlipidemia with mild aortic  Iliac disease and SFA disease. 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 He does have moderate renal insufficiency as well. We will recheck a lower extremity arterial Doppler studies

## 2015-05-21 NOTE — Progress Notes (Signed)
05/21/2015 [name]   1941/02/16  [id number]  Primary Physician Reginia Forts, MD Primary Cardiologist: [name] MD Renae Gloss   HPI:  The patient is a very pleasant 74 year old, thin and fit-appearing married Caucasian male with no children, who I last saw in the office 03/13/14.[name] He has a history of CAD and PVOD. He had coronary artery bypass grafting in 2006 with a LIMA to his LAD, a vein to the RCA, circumflex, and diagonal branches. He did develop paroxysmal atrial fibrillation postoperatively. He has PVOD, status post right carotid endarterectomy performed by Dr. [name], as well as a known aortoiliac and left SFA disease, but he denies claudication except after walking seven or eight blocks in his left calf. His other problems include treated hypertension and dyslipidemia. He had a catheterization July 13, 2010, and found to have patent grafts with moderate aortoiliac disease. He does have moderate renal insufficiency followed by Dr. [name]. He has had laparoscopic cholecystectomy that was uncomplicated in the past.. He did have a negative Myoview stress test 8/14.. Since I saw him one year ago he's remained clinically stable denying chest pain or shortness of breath.  Current Outpatient Prescriptions  Medication Sig Dispense Refill  . aspirin EC 81 MG tablet Take 81 mg by mouth at bedtime.    . Choline Fenofibrate (FENOFIBRIC ACID) 45 MG CPDR TAKE 1 CAPSULE (45MG ) BY MOUTH AT BEDTIME 30 capsule 0  . esomeprazole (NEXIUM) 40 MG capsule Take 40 mg by mouth as needed.     . ezetimibe (ZETIA) 10 MG tablet Take 1 tablet (10 mg total) by mouth daily. 90 tablet 3  . furosemide (LASIX) 20 MG tablet Take 10 mg by mouth daily.    . metoprolol tartrate (LOPRESSOR) 25 MG tablet Take 0.5 tablets (12.5 mg total) by mouth 2 (two) times daily. NEED OV. 90 tablet 0  . nitroGLYCERIN (NITROSTAT) 0.4 MG SL tablet Place 1 tablet (0.4 mg total) under the tongue every  5 (five) minutes as needed for chest pain. 25 tablet 11  . simvastatin (ZOCOR) 80 MG tablet Take 1 tablet (80 mg total) by mouth daily at 6 PM. NEED OV. 90 tablet 0   No current facility-administered medications for this visit.    Allergies  Allergen Reactions  . Latex Rash    Social History   Social History  . Marital Status: Married    Spouse Name: [name]  . Number of Children: 0  . Years of Education: N/A   Occupational History  . Retired     Passenger transport manager  .     Social History Main Topics  . Smoking status: Never Smoker   . Smokeless tobacco: Not on file  . Alcohol Use: 0.0 oz/week    0 Standard drinks or equivalent per week     Comment: socially  . Drug Use: No  . Sexual Activity: Yes   Other Topics Concern  . Not on file   Social History Narrative   Last name is pronounced "cas ka" No caffeine use. Always uses seat belts. Exercise:Moderate walks 30 min daily and rides a bike. Uses treadmill 20 mins everyday. Smoke alarm in home, No guns in the home.      Marital status:  Married x 15 years: happily married. Most of family in [place].      Children; none      Lives: with wife      Employment: retired.      Exercise:  Treadmill 20  minutes per day seven days per week.  Also walking throughout the day.     Review of Systems: General: negative for chills, fever, night sweats or weight changes.  Cardiovascular: negative for chest pain, dyspnea on exertion, edema, orthopnea, palpitations, paroxysmal nocturnal dyspnea or shortness of breath Dermatological: negative for rash Respiratory: negative for cough or wheezing Urologic: negative for hematuria Abdominal: negative for nausea, vomiting, diarrhea, bright red blood per rectum, melena, or hematemesis Neurologic: negative for visual changes, syncope, or dizziness All other systems reviewed and are otherwise negative except as noted above.    Blood pressure 126/74, pulse 58, height 5\' 9"  (1.753 m), weight 157 lb  (71.215 kg).  General appearance: alert and no distress Neck: no adenopathy, no carotid bruit, no JVD, supple, symmetrical, trachea midline and thyroid not enlarged, symmetric, no tenderness/mass/nodules Lungs: clear to auscultation bilaterally Heart: regular rate and rhythm, S1, S2 normal, no murmur, click, rub or gallop Extremities: extremities normal, atraumatic, no cyanosis or edema  EKG sinus bradycardia 58 with incomplete right bundle-branch block/RV conduction delay. I personally reviewed this EKG  ASSESSMENT AND PLAN:   Pure hypercholesterolemia History of hyperlipidemia on simvastatin and Zetia. We will recheck a lipid and liver profile  Peripheral arterial disease History of hyperlipidemia with mild aortic  Iliac disease and SFA disease. 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 He does have moderate renal insufficiency as well. We will recheck a lower extremity arterial Doppler studies  Occlusion and stenosis of carotid artery without mention of cerebral infarction History of right carotid endarterectomy with carotid Dopplers performed 04/29/14 revealing a widely patent endarterectomy site and moderate left ICA stenosis. He is neurologically asymptomatic. We will recheck carotid Dopplers on him in 1 year  Essential hypertension, benign History of hypertension blood pressure measured at 126/74. He is on metoprolol. Continue current meds at current dosing  CAD (coronary artery disease), hx CABG in 2006.  last cath 2012 with patent grafts.  History of coronary artery disease status post bypass graft in 2006 mm LAD, vein graft to the RCA, circumflex and diagonal branches. He required catheterization performed 07/13/10 was found to have patent grafts with moderate aortoiliac disease. He had a negative Myoview stress  test 2 years ago. He denies chest pain or shortness of breath.      [name] MD FACP,FACC,FAHA, [facility] 05/21/2015 9:15 AM

## 2015-05-21 NOTE — Patient Instructions (Signed)
Medication Instructions:  Your physician recommends that you continue on your current medications as directed. Please refer to the Current Medication list given to you today.   Labwork: Your physician recommends that you return for lab work in: FASTING (lipd/liver) The lab can be found on the FIRST FLOOR of out building in [address]   Testing/Procedures: Your physician has requested that you have a lower extremity arterial exercise duplex. During this test, exercise and ultrasound are used to evaluate arterial blood flow in the legs. Allow one hour for this exam. There are no restrictions or special instructions.  Your physician has requested that you have a carotid duplex. This test is an ultrasound of the carotid arteries in your neck. It looks at blood flow through these arteries that supply the brain with blood. Allow one hour for this exam. There are no restrictions or special instructions.    Follow-Up: Your physician wants you to follow-up in: 12 months with Dr. Gwenlyn Found.  You will receive a reminder letter in the mail two months in advance. If you don't receive a letter, please call our office to schedule the follow-up appointment.   Any Other Special Instructions Will Be Listed Below (If Applicable).     If you need a refill on your cardiac medications before your next appointment, please call your pharmacy.

## 2015-05-22 ENCOUNTER — Other Ambulatory Visit: Payer: Self-pay | Admitting: Cardiovascular Disease

## 2015-05-22 NOTE — Telephone Encounter (Signed)
REFILL 

## 2015-06-02 ENCOUNTER — Ambulatory Visit: Payer: Medicare Other | Admitting: Cardiovascular Disease

## 2015-06-23 ENCOUNTER — Inpatient Hospital Stay (HOSPITAL_COMMUNITY): Admission: RE | Admit: 2015-06-23 | Payer: Medicare Other | Source: Ambulatory Visit

## 2015-06-25 ENCOUNTER — Other Ambulatory Visit: Payer: Self-pay | Admitting: Cardiovascular Disease

## 2015-06-25 DIAGNOSIS — I10 Essential (primary) hypertension: Secondary | ICD-10-CM

## 2015-06-25 DIAGNOSIS — D638 Anemia in other chronic diseases classified elsewhere: Secondary | ICD-10-CM | POA: Diagnosis not present

## 2015-06-25 DIAGNOSIS — N183 Chronic kidney disease, stage 3 (moderate): Secondary | ICD-10-CM | POA: Diagnosis not present

## 2015-06-25 DIAGNOSIS — E785 Hyperlipidemia, unspecified: Secondary | ICD-10-CM | POA: Diagnosis not present

## 2015-06-25 DIAGNOSIS — I739 Peripheral vascular disease, unspecified: Secondary | ICD-10-CM

## 2015-06-27 ENCOUNTER — Encounter: Payer: Self-pay | Admitting: Cardiovascular Disease

## 2015-07-01 ENCOUNTER — Ambulatory Visit (HOSPITAL_COMMUNITY)
Admission: RE | Admit: 2015-07-01 | Discharge: 2015-07-01 | Disposition: A | Discharge status: Home / Self Care | Payer: Medicare Other | Source: Ambulatory Visit | Attending: Cardiovascular Disease | Admitting: Cardiovascular Disease

## 2015-07-01 ENCOUNTER — Other Ambulatory Visit: Payer: Self-pay | Admitting: Cardiovascular Disease

## 2015-07-01 DIAGNOSIS — N189 Chronic kidney disease, unspecified: Secondary | ICD-10-CM | POA: Insufficient documentation

## 2015-07-01 DIAGNOSIS — I129 Hypertensive chronic kidney disease with stage 1 through stage 4 chronic kidney disease, or unspecified chronic kidney disease: Secondary | ICD-10-CM | POA: Insufficient documentation

## 2015-07-01 DIAGNOSIS — I739 Peripheral vascular disease, unspecified: Secondary | ICD-10-CM | POA: Diagnosis not present

## 2015-07-01 DIAGNOSIS — I10 Essential (primary) hypertension: Secondary | ICD-10-CM

## 2015-07-01 DIAGNOSIS — R938 Abnormal findings on diagnostic imaging of other specified body structures: Secondary | ICD-10-CM | POA: Diagnosis not present

## 2015-07-01 DIAGNOSIS — E7800 Pure hypercholesterolemia, unspecified: Secondary | ICD-10-CM | POA: Insufficient documentation

## 2015-07-15 ENCOUNTER — Other Ambulatory Visit: Payer: Self-pay | Admitting: Cardiovascular Disease

## 2015-07-15 NOTE — Telephone Encounter (Signed)
Rx request sent to pharmacy.  

## 2015-08-20 DIAGNOSIS — I739 Peripheral vascular disease, unspecified: Secondary | ICD-10-CM | POA: Diagnosis not present

## 2015-08-20 DIAGNOSIS — D751 Secondary polycythemia: Secondary | ICD-10-CM | POA: Diagnosis not present

## 2015-08-20 DIAGNOSIS — N013 Rapidly progressive nephritic syndrome with diffuse mesangial proliferative glomerulonephritis: Secondary | ICD-10-CM | POA: Diagnosis not present

## 2015-08-20 DIAGNOSIS — I129 Hypertensive chronic kidney disease with stage 1 through stage 4 chronic kidney disease, or unspecified chronic kidney disease: Secondary | ICD-10-CM | POA: Diagnosis not present

## 2015-08-20 DIAGNOSIS — I7090 Unspecified atherosclerosis: Secondary | ICD-10-CM | POA: Diagnosis not present

## 2015-08-20 DIAGNOSIS — N183 Chronic kidney disease, stage 3 (moderate): Secondary | ICD-10-CM | POA: Diagnosis not present

## 2015-08-20 DIAGNOSIS — E785 Hyperlipidemia, unspecified: Secondary | ICD-10-CM | POA: Diagnosis not present

## 2015-09-09 ENCOUNTER — Ambulatory Visit (INDEPENDENT_AMBULATORY_CARE_PROVIDER_SITE_OTHER): Payer: Medicare Other | Admitting: Family Medicine

## 2015-09-09 ENCOUNTER — Encounter: Payer: Self-pay | Admitting: Family Medicine

## 2015-09-09 VITALS — BP 116/71 | HR 78 | Temp 98.2°F | Resp 16 | Ht 66.5 in | Wt 161.2 lb

## 2015-09-09 DIAGNOSIS — Z1211 Encounter for screening for malignant neoplasm of colon: Secondary | ICD-10-CM

## 2015-09-09 DIAGNOSIS — Z0000 Encounter for general adult medical examination without abnormal findings: Secondary | ICD-10-CM

## 2015-09-09 DIAGNOSIS — I2510 Atherosclerotic heart disease of native coronary artery without angina pectoris: Secondary | ICD-10-CM | POA: Diagnosis not present

## 2015-09-09 DIAGNOSIS — N059 Unspecified nephritic syndrome with unspecified morphologic changes: Secondary | ICD-10-CM | POA: Diagnosis not present

## 2015-09-09 DIAGNOSIS — D582 Other hemoglobinopathies: Secondary | ICD-10-CM | POA: Diagnosis not present

## 2015-09-09 DIAGNOSIS — E7800 Pure hypercholesterolemia, unspecified: Secondary | ICD-10-CM

## 2015-09-09 DIAGNOSIS — I739 Peripheral vascular disease, unspecified: Secondary | ICD-10-CM

## 2015-09-09 DIAGNOSIS — I10 Essential (primary) hypertension: Secondary | ICD-10-CM | POA: Diagnosis not present

## 2015-09-09 DIAGNOSIS — I25810 Atherosclerosis of coronary artery bypass graft(s) without angina pectoris: Secondary | ICD-10-CM | POA: Diagnosis not present

## 2015-09-09 LAB — CBC WITH DIFFERENTIAL/PLATELET
Basophils Absolute: 0.1 10*3/uL (ref 0.0–0.1)
Basophils Relative: 1 % (ref 0–1)
Eosinophils Absolute: 0.1 10*3/uL (ref 0.0–0.7)
Eosinophils Relative: 1 % (ref 0–5)
HEMATOCRIT: 50 % (ref 39.0–52.0)
HEMOGLOBIN: 17 g/dL (ref 13.0–17.0)
LYMPHS ABS: 1.1 10*3/uL (ref 0.7–4.0)
LYMPHS PCT: 19 % (ref 12–46)
MCH: 29.9 pg (ref 26.0–34.0)
MCHC: 34 g/dL (ref 30.0–36.0)
MCV: 87.9 fL (ref 78.0–100.0)
MONO ABS: 0.6 10*3/uL (ref 0.1–1.0)
MONOS PCT: 11 % (ref 3–12)
MPV: 10.3 fL (ref 8.6–12.4)
NEUTROS ABS: 3.8 10*3/uL (ref 1.7–7.7)
Neutrophils Relative %: 68 % (ref 43–77)
Platelets: 202 10*3/uL (ref 150–400)
RBC: 5.69 MIL/uL (ref 4.22–5.81)
RDW: 13.4 % (ref 11.5–15.5)
WBC: 5.6 10*3/uL (ref 4.0–10.5)

## 2015-09-09 LAB — POCT URINALYSIS DIP (MANUAL ENTRY)
BILIRUBIN UA: NEGATIVE
BILIRUBIN UA: NEGATIVE
GLUCOSE UA: NEGATIVE
Leukocytes, UA: NEGATIVE
Nitrite, UA: NEGATIVE
Protein Ur, POC: NEGATIVE
SPEC GRAV UA: 1.01
Urobilinogen, UA: 0.2
pH, UA: 5.5

## 2015-09-09 MED ORDER — NITROGLYCERIN 0.4 MG SL SUBL: 0.4000 mg | SUBLINGUAL_TABLET | SUBLINGUAL | Status: DC | PRN

## 2015-09-09 MED ORDER — ACYCLOVIR 400 MG PO TABS: 400.0000 mg | ORAL_TABLET | Freq: Every day | ORAL | Status: DC

## 2015-09-09 NOTE — Patient Instructions (Addendum)
   IF you received an x-ray today, you will receive an invoice from [facility]. Please contact [facility] at [phone] with questions or concerns regarding your invoice.   IF you received labwork today, you will receive an invoice from [facility]. Please contact [facility] at [phone] with questions or concerns regarding your invoice.   Our billing staff will not be able to assist you with questions regarding bills from these companies.  You will be contacted with the lab results as soon as they are available. The fastest way to get your results is to activate your My Chart account. Instructions are located on the last page of this paperwork. If you have not heard from us regarding the results in 2 weeks, please contact this office.    Keeping you healthy  Get these tests  Blood pressure- Have your blood pressure checked once a year by your healthcare provider.  Normal blood pressure is 120/80  Weight- Have your body mass index (BMI) calculated to screen for obesity.  BMI is a measure of body fat based on height and weight. You can also calculate your own BMI at [email or web address].com/bmi/.  Cholesterol- Have your cholesterol checked every year.  Diabetes- Have your blood sugar checked regularly if you have high blood pressure, high cholesterol, have a family history of diabetes or if you are overweight.  Screening for Colon Cancer- Colonoscopy starting at age 50.  Screening may begin sooner depending on your family history and other health conditions. Follow up colonoscopy as directed by your Gastroenterologist.  Screening for Prostate Cancer- Both blood work (PSA) and a rectal exam help screen for Prostate Cancer.  Screening begins at age 40 with African-American men and at age 50 with Caucasian men.  Screening may begin sooner depending on your family history.  Take these medicines  Aspirin- One aspirin daily can help prevent Heart  disease and Stroke.  Flu shot- Every fall.  Tetanus- Every 10 years.  Zostavax- Once after the age of 60 to prevent Shingles.  Pneumonia shot- Once after the age of 65; if you are younger than 65, ask your healthcare provider if you need a Pneumonia shot.  Take these steps  Don't smoke- If you do smoke, talk to your doctor about quitting.  For tips on how to quit, go to www.smokefree.gov or call 1-800-QUIT-NOW.  Be physically active- Exercise 5 days a week for at least 30 minutes.  If you are not already physically active start slow and gradually work up to 30 minutes of moderate physical activity.  Examples of moderate activity include walking briskly, mowing the yard, dancing, swimming, bicycling, etc.  Eat a healthy diet- Eat a variety of healthy food such as fruits, vegetables, low fat milk, low fat cheese, yogurt, lean meant, poultry, fish, beans, tofu, etc. For more information go to www.thenutritionsource.org  Drink alcohol in moderation- Limit alcohol intake to less than two drinks a day. Never drink and drive.  Dentist- Brush and floss twice daily; visit your dentist twice a year.  Depression- Your emotional health is as important as your physical health. If you're feeling down, or losing interest in things you would normally enjoy please talk to your healthcare provider.  Eye exam- Visit your eye doctor every year.  Safe sex- If you may be exposed to a sexually transmitted infection, use a condom.  Seat belts- Seat belts can save your life; always wear one.  Smoke/Carbon Monoxide detectors- These detectors need to be installed on   the appropriate level of your home.  Replace batteries at least once a year.  Skin cancer- When out in the sun, cover up and use sunscreen 15 SPF or higher.  Violence- If anyone is threatening you, please tell your healthcare provider.  Living Will/ Health care power of attorney- Speak with your healthcare provider and family. 

## 2015-09-09 NOTE — Progress Notes (Signed)
Subjective:    Patient ID: [name], male    DOB: January 09, 1941, 75 y.o.   MRN: [id number]  09/09/2015  Follow-up and Medication Refill   HPI This 75 y.o. male presents for Annual Wellness Examination.  Last physical:  12-19-2013 Colonoscopy: still postponing colonoscopy; never had one; s/p sigmoidoscopy.   TDAP:  2012 Pneumovax: refuses Zostavax:  refuses Influenza:  refuses   HTN: Patient reports good compliance with medication, good tolerance to medication, and good symptom control.    Hyperlipidemia: Patient reports good compliance with medication, good tolerance to medication, and good symptom control.    Glomerulonephritis:  S/p follow-up with Dr. Belinda Fisher.    Elevated H/H:  Detected with Dr. Kemper Durie; recommended follow-up with PCP.  May warrant referral to hematology.  No fatigue other than lack of activity when not exercising.    CAD: s/p follow-up with Dr. Gwenlyn Found in 05/2015.    Plantar fasciitis: onset of L heel; really painful. Held treadmill for one month; iced at nighttime.  Took one month to improve.  Discussed with Dr. Genelle Bal; bought inserts.  Back up to 24 minutes on the treadmill.  Will need to work self back up to 1 mile per day.    Genital swelling:  Still present.    PAD: s/p studies; recommending stenting in L leg; has been putting on hold due to CRI.  Considering what to do.  No pain or heaviness in L leg.     Review of Systems  Constitutional: Negative for fever, chills, diaphoresis, activity change, appetite change, fatigue and unexpected weight change.  HENT: Negative for congestion, dental problem, drooling, ear discharge, ear pain, facial swelling, hearing loss, mouth sores, nosebleeds, postnasal drip, rhinorrhea, sinus pressure, sneezing, sore throat, tinnitus, trouble swallowing and voice change.   Eyes: Negative for photophobia, pain, discharge, redness, itching and visual disturbance.  Respiratory: Negative for apnea, cough, choking,  chest tightness, shortness of breath, wheezing and stridor.   Cardiovascular: Negative for chest pain, palpitations and leg swelling.  Gastrointestinal: Negative for nausea, vomiting, abdominal pain, diarrhea, constipation and blood in stool.  Endocrine: Negative for cold intolerance, heat intolerance, polydipsia, polyphagia and polyuria.  Genitourinary: Negative for dysuria, urgency, frequency, hematuria, flank pain, decreased urine volume, discharge, penile swelling, scrotal swelling, enuresis, difficulty urinating, genital sores, penile pain and testicular pain.  Musculoskeletal: Negative for myalgias, back pain, joint swelling, arthralgias, gait problem, neck pain and neck stiffness.  Skin: Negative for color change, pallor, rash and wound.  Allergic/Immunologic: Negative for environmental allergies, food allergies and immunocompromised state.  Neurological: Negative for dizziness, tremors, seizures, syncope, facial asymmetry, speech difficulty, weakness, light-headedness, numbness and headaches.  Hematological: Negative for adenopathy. Does not bruise/bleed easily.  Psychiatric/Behavioral: Negative for suicidal ideas, hallucinations, behavioral problems, confusion, sleep disturbance, self-injury, dysphoric mood, decreased concentration and agitation. The patient is not nervous/anxious and is not hyperactive.     Past Medical History  Diagnosis Date  . CAD (coronary artery disease)     HX CABG 2006 X 4; last nuc 11/2010 last cath 2012-wth 95% OM too small for intervention, treated medically   . Chronic renal insufficiency     glomerulonephritis [id number]  . Anemia, unspecified   . Lung mass   . Cerebrovascular disease, unspecified     hx CEA-RT; KNOWN AOTOILIAC 7 LT sfa disease  . Benign paroxysmal positional vertigo   . Esophageal reflux   . Peripheral vascular disease, unspecified ([place])   . Pure hypercholesterolemia   . Essential hypertension, benign   .  Genital herpes, unspecified     . Carcinoma in situ of skin, site unspecified   . Congenital anomaly of tongue, unspecified   . Blood transfusion without reported diagnosis   . PAF (paroxysmal atrial fibrillation) ([place])     post CABG, maintaining SR  . Hx of echocardiogram 01/2005    EF 55% normal study  . History of stress test 06/2010   Past Surgical History  Procedure Laterality Date  . Coronary artery bypass graft  05/12/2005    LIMA-LAD, VG-DIAG; VG-LCX,OM; VG-RCA  . Carotid endarterectomy  2007    Right  . Cardiac catheterization  06/2010  . Cholecystectomy  07/2010   Allergies  Allergen Reactions  . Latex Rash   Current Outpatient Prescriptions  Medication Sig Dispense Refill  . aspirin EC 81 MG tablet Take 81 mg by mouth at bedtime.    . Choline Fenofibrate (FENOFIBRIC ACID) 45 MG CPDR TAKE 1 CAPSULE (45MG ) BY MOUTH AT BEDTIME 30 capsule 11  . esomeprazole (NEXIUM) 40 MG capsule Take 40 mg by mouth as needed.     . ezetimibe (ZETIA) 10 MG tablet Take 1 tablet (10 mg total) by mouth daily. 90 tablet 3  . furosemide (LASIX) 20 MG tablet Take 10 mg by mouth daily.    . metoprolol tartrate (LOPRESSOR) 25 MG tablet Take 1 tablet (25 mg total) by mouth 2 (two) times daily. 90 tablet 3  . nitroGLYCERIN (NITROSTAT) 0.4 MG SL tablet Place 1 tablet (0.4 mg total) under the tongue every 5 (five) minutes as needed for chest pain. 25 tablet 11  . simvastatin (ZOCOR) 80 MG tablet Take 1 tablet (80 mg total) by mouth daily at 6 PM. 90 tablet 3  . acyclovir (ZOVIRAX) 400 MG tablet Take 1 tablet (400 mg total) by mouth 5 (five) times daily. 30 tablet 2   No current facility-administered medications for this visit.   Social History   Social History  . Marital Status: Married    Spouse Name: [name]  . Number of Children: 0  . Years of Education: N/A   Occupational History  . Retired     Passenger transport manager  .     Social History Main Topics  . Smoking status: Never Smoker   . Smokeless tobacco: Not on file  . Alcohol  Use: 0.0 oz/week    0 Standard drinks or equivalent per week     Comment: socially  . Drug Use: No  . Sexual Activity: Yes   Other Topics Concern  . Not on file   Social History Narrative   Last name is pronounced "cas ka" No caffeine use. Always uses seat belts. Exercise:Moderate walks 30 min daily and rides a bike. Uses treadmill 20 mins everyday. Smoke alarm in home, No guns in the home.      Marital status:  Married x 15 years: happily married. Most of family in [place].      Children; none      Lives: with wife      Employment: retired.      Exercise:  Treadmill 20 minutes per day seven days per week.  Also walking throughout the day.      Advanced Directives: YES: FULL CODE.   Family History  Problem Relation Age of Onset  . Diabetes Mother        Objective:    BP 116/71 mmHg  Pulse 78  Temp(Src) 98.2 F (36.8 C) (Oral)  Resp 16  Ht 5' 6.5" (1.689 m)  Wt 161  lb 3.2 oz (73.12 kg)  BMI 25.63 kg/m2 Physical Exam  Constitutional: He is oriented to person, place, and time. He appears well-developed and well-nourished. No distress.  HENT:  Head: Normocephalic and atraumatic.  Right Ear: External ear normal.  Left Ear: External ear normal.  Nose: Nose normal.  Mouth/Throat: Oropharynx is clear and moist.  Eyes: Conjunctivae and EOM are normal. Pupils are equal, round, and reactive to light.  Neck: Normal range of motion. Neck supple. Carotid bruit is not present. No thyromegaly present.  Cardiovascular: Normal rate, regular rhythm, normal heart sounds and intact distal pulses.  Exam reveals no gallop and no friction rub.   No murmur heard. Pulmonary/Chest: Effort normal and breath sounds normal. He has no wheezes. He has no rales.  Abdominal: Soft. Bowel sounds are normal. He exhibits no distension and no mass. There is no tenderness. There is no rebound and no guarding.  Musculoskeletal:       Right shoulder: Normal.       Left shoulder: Normal.       Cervical  back: Normal.  Lymphadenopathy:    He has no cervical adenopathy.  Neurological: He is alert and oriented to person, place, and time. He has normal reflexes. No cranial nerve deficit. He exhibits normal muscle tone. Coordination normal.  Skin: Skin is warm and dry. No rash noted. He is not diaphoretic.  Psychiatric: He has a normal mood and affect. His behavior is normal. Judgment and thought content normal.   Depression screen [facility] 2/9 09/09/2015 11/07/2014 12/19/2013  Decreased Interest 0 0 0  Down, Depressed, Hopeless 0 0 0  PHQ - 2 Score 0 0 0   Fall Risk  09/09/2015 12/19/2013  Falls in the past year? No No       Assessment & Plan:   1. Encounter for Medicare annual wellness exam   2. Coronary artery disease involving coronary bypass graft of native heart without angina pectoris   3. Pure hypercholesterolemia   4. Peripheral arterial disease ([place])   5. Essential hypertension, benign   6. Glomerulonephritis   7. Screening for colon cancer   8. Elevated hemoglobin ([facility])   9. Coronary artery disease involving native coronary artery of native heart without angina pectoris     Orders Placed This Encounter  Procedures  . CBC with Differential/Platelet  . Comprehensive metabolic panel    Order Specific Question:  Has the patient fasted?    Answer:  Yes  . POCT urinalysis dipstick  . POC Hemoccult Bld/Stl (3-Cd Home Screen)    Standing Status: Future     Number of Occurrences:      Standing Expiration Date: 09/08/2016   Meds ordered this encounter  Medications  . acyclovir (ZOVIRAX) 400 MG tablet    Sig: Take 1 tablet (400 mg total) by mouth 5 (five) times daily.    Dispense:  30 tablet    Refill:  2  . nitroGLYCERIN (NITROSTAT) 0.4 MG SL tablet    Sig: Place 1 tablet (0.4 mg total) under the tongue every 5 (five) minutes as needed for chest pain.    Dispense:  25 tablet    Refill:  11    Return in about 1 year (around 09/08/2016) for complete physical  examiniation.    Brenda Samano Elayne Guerin, M.D. [facility] [address] [place], [place]  [address] [phone] phone [phone] fax

## 2015-09-10 DIAGNOSIS — H6123 Impacted cerumen, bilateral: Secondary | ICD-10-CM | POA: Diagnosis not present

## 2015-09-10 DIAGNOSIS — H903 Sensorineural hearing loss, bilateral: Secondary | ICD-10-CM | POA: Diagnosis not present

## 2015-09-10 DIAGNOSIS — H9313 Tinnitus, bilateral: Secondary | ICD-10-CM | POA: Diagnosis not present

## 2015-09-10 LAB — COMPREHENSIVE METABOLIC PANEL
ALBUMIN: 4.6 g/dL (ref 3.6–5.1)
ALK PHOS: 65 U/L (ref 40–115)
ALT: 28 U/L (ref 9–46)
AST: 33 U/L (ref 10–35)
BILIRUBIN TOTAL: 0.5 mg/dL (ref 0.2–1.2)
BUN: 16 mg/dL (ref 7–25)
CALCIUM: 9.4 mg/dL (ref 8.6–10.3)
CO2: 25 mmol/L (ref 20–31)
CREATININE: 1.53 mg/dL — AB (ref 0.70–1.18)
Chloride: 101 mmol/L (ref 98–110)
GLUCOSE: 91 mg/dL (ref 65–99)
Potassium: 5 mmol/L (ref 3.5–5.3)
SODIUM: 137 mmol/L (ref 135–146)
Total Protein: 6.7 g/dL (ref 6.1–8.1)

## 2015-09-15 LAB — POC HEMOCCULT BLD/STL (HOME/3-CARD/SCREEN)
Card #2 Fecal Occult Blod, POC: NEGATIVE
Card #3 Fecal Occult Blood, POC: NEGATIVE
Fecal Occult Blood, POC: NEGATIVE

## 2015-09-15 NOTE — Addendum Note (Signed)
Addended by: Carter Kitten on: 09/15/2015 05:52 PM   Modules accepted: Orders

## 2016-02-06 DIAGNOSIS — D2272 Melanocytic nevi of left lower limb, including hip: Secondary | ICD-10-CM | POA: Diagnosis not present

## 2016-02-06 DIAGNOSIS — D1801 Hemangioma of skin and subcutaneous tissue: Secondary | ICD-10-CM | POA: Diagnosis not present

## 2016-02-06 DIAGNOSIS — Z85828 Personal history of other malignant neoplasm of skin: Secondary | ICD-10-CM | POA: Diagnosis not present

## 2016-02-06 DIAGNOSIS — D485 Neoplasm of uncertain behavior of skin: Secondary | ICD-10-CM | POA: Diagnosis not present

## 2016-02-06 DIAGNOSIS — L570 Actinic keratosis: Secondary | ICD-10-CM | POA: Diagnosis not present

## 2016-02-06 DIAGNOSIS — D225 Melanocytic nevi of trunk: Secondary | ICD-10-CM | POA: Diagnosis not present

## 2016-02-06 DIAGNOSIS — L218 Other seborrheic dermatitis: Secondary | ICD-10-CM | POA: Diagnosis not present

## 2016-02-06 DIAGNOSIS — L821 Other seborrheic keratosis: Secondary | ICD-10-CM | POA: Diagnosis not present

## 2016-02-20 ENCOUNTER — Other Ambulatory Visit: Payer: Self-pay | Admitting: Cardiovascular Disease

## 2016-02-20 ENCOUNTER — Other Ambulatory Visit: Payer: Self-pay

## 2016-02-20 MED ORDER — METOPROLOL TARTRATE 25 MG PO TABS: 25.0000 mg | ORAL_TABLET | Freq: Two times a day (BID) | ORAL | 0 refills | Status: DC

## 2016-03-17 DIAGNOSIS — D751 Secondary polycythemia: Secondary | ICD-10-CM | POA: Diagnosis not present

## 2016-03-17 DIAGNOSIS — N013 Rapidly progressive nephritic syndrome with diffuse mesangial proliferative glomerulonephritis: Secondary | ICD-10-CM | POA: Diagnosis not present

## 2016-03-17 DIAGNOSIS — E785 Hyperlipidemia, unspecified: Secondary | ICD-10-CM | POA: Diagnosis not present

## 2016-03-17 DIAGNOSIS — I7090 Unspecified atherosclerosis: Secondary | ICD-10-CM | POA: Diagnosis not present

## 2016-03-17 DIAGNOSIS — I129 Hypertensive chronic kidney disease with stage 1 through stage 4 chronic kidney disease, or unspecified chronic kidney disease: Secondary | ICD-10-CM | POA: Diagnosis not present

## 2016-03-17 DIAGNOSIS — I739 Peripheral vascular disease, unspecified: Secondary | ICD-10-CM | POA: Diagnosis not present

## 2016-03-17 DIAGNOSIS — N183 Chronic kidney disease, stage 3 (moderate): Secondary | ICD-10-CM | POA: Diagnosis not present

## 2016-03-22 ENCOUNTER — Telehealth: Payer: Self-pay | Admitting: Cardiovascular Disease

## 2016-03-22 NOTE — Telephone Encounter (Signed)
Called and left voicemail for the patient to call me back to schedule his followup with Dr. Gwenlyn Found.

## 2016-03-25 ENCOUNTER — Telehealth: Payer: Self-pay | Admitting: Cardiovascular Disease

## 2016-03-25 NOTE — Telephone Encounter (Signed)
Closed encounte

## 2016-04-06 ENCOUNTER — Other Ambulatory Visit: Payer: Self-pay | Admitting: Cardiovascular Disease

## 2016-05-18 ENCOUNTER — Ambulatory Visit (HOSPITAL_COMMUNITY)
Admission: RE | Admit: 2016-05-18 | Discharge: 2016-05-18 | Disposition: A | Discharge status: Home / Self Care | Payer: Medicare Other | Source: Ambulatory Visit | Attending: Cardiology | Admitting: Cardiology

## 2016-05-18 DIAGNOSIS — I10 Essential (primary) hypertension: Secondary | ICD-10-CM | POA: Insufficient documentation

## 2016-05-18 DIAGNOSIS — I708 Atherosclerosis of other arteries: Secondary | ICD-10-CM | POA: Insufficient documentation

## 2016-05-18 DIAGNOSIS — I739 Peripheral vascular disease, unspecified: Secondary | ICD-10-CM

## 2016-05-18 DIAGNOSIS — I6523 Occlusion and stenosis of bilateral carotid arteries: Secondary | ICD-10-CM | POA: Diagnosis not present

## 2016-05-24 ENCOUNTER — Other Ambulatory Visit: Payer: Self-pay | Admitting: Cardiovascular Disease

## 2016-05-24 DIAGNOSIS — I6529 Occlusion and stenosis of unspecified carotid artery: Secondary | ICD-10-CM

## 2016-05-25 ENCOUNTER — Ambulatory Visit (INDEPENDENT_AMBULATORY_CARE_PROVIDER_SITE_OTHER): Payer: Medicare Other | Admitting: Cardiovascular Disease

## 2016-05-25 ENCOUNTER — Encounter: Payer: Self-pay | Admitting: Cardiovascular Disease

## 2016-05-25 VITALS — BP 134/72 | HR 64 | Ht 66.0 in | Wt 165.0 lb

## 2016-05-25 DIAGNOSIS — I6529 Occlusion and stenosis of unspecified carotid artery: Secondary | ICD-10-CM | POA: Diagnosis not present

## 2016-05-25 DIAGNOSIS — I10 Essential (primary) hypertension: Secondary | ICD-10-CM | POA: Diagnosis not present

## 2016-05-25 DIAGNOSIS — I739 Peripheral vascular disease, unspecified: Secondary | ICD-10-CM | POA: Diagnosis not present

## 2016-05-25 NOTE — Assessment & Plan Note (Signed)
History of hyperlipidemia on statin therapy followed by his PCP 

## 2016-05-25 NOTE — Assessment & Plan Note (Signed)
History of CAD status post coronary artery bypass graft in 2006 with a LIMA to his LAD vein to the RCA, circumflex and diagonal branches is less Myoview performed August 2014 was nonischemic. He denies chest pain or shortness of breath.

## 2016-05-25 NOTE — Assessment & Plan Note (Signed)
History of carotid artery disease status post right carotid endarterectomy performed by Dr. Scot Dock in the past. We will follow respectively. Ultrasound which most recently was checked 05/18/16 revealing moderate stable left ICA stenosis with a widely patent endarterectomy site. We'll repeat this one year.

## 2016-05-25 NOTE — Assessment & Plan Note (Signed)
History of hypertension blood pressure measures 134/73. He is on metoprolol. Continue current meds at current dosing

## 2016-05-25 NOTE — Assessment & Plan Note (Signed)
History of peripheral arterial disease with known aortoiliac left SFA disease. Does have mild left calf claudication with moderate exercise. His most recent Dopplers performed 07/01/15 revealed normal right ABI with a left ABI 0.89. We'll follow this on an every other year basis

## 2016-05-25 NOTE — Progress Notes (Signed)
05/25/2016 [name]   February 03, 1941  [id number]  Primary Physician Reginia Forts, MD Primary Cardiologist: [name] MD [name], [place]  HPI:  The patient is a very pleasant 75 year old, thin and fit-appearing married Caucasian male with no children, who I last saw in the office 05/21/15.[name] He has a history of CAD and PVOD. He had coronary artery bypass grafting in 2006 with a LIMA to his LAD, a vein to the RCA, circumflex, and diagonal branches. He did develop paroxysmal atrial fibrillation postoperatively. He has PVOD, status post right carotid endarterectomy performed by Dr. [name], as well as a known aortoiliac and left SFA disease, but he denies claudication except after walking seven or eight blocks in his left calf. His other problems include treated hypertension and dyslipidemia. He had a catheterization July 13, 2010, and found to have patent grafts with moderate aortoiliac disease. He does have moderate renal insufficiency followed by Dr. [name]. He has had laparoscopic cholecystectomy that was uncomplicated in the past.. He did have a negative Myoview stress test 8/14.. Since I saw him one year ago he's remained clinically stable denying chest pain or shortness of breath.   Current Outpatient Prescriptions  Medication Sig Dispense Refill  . acyclovir (ZOVIRAX) 400 MG tablet Take 1 tablet (400 mg total) by mouth 5 (five) times daily. (Patient taking differently: Take 400 mg by mouth daily as needed. ) 30 tablet 2  . aspirin EC 81 MG tablet Take 81 mg by mouth at bedtime.    . Choline Fenofibrate (FENOFIBRIC ACID) 45 MG CPDR TAKE 1 CAPSULE (45MG ) BY MOUTH AT BEDTIME 30 capsule 11  . esomeprazole (NEXIUM) 40 MG capsule Take 40 mg by mouth as needed.     . ezetimibe (ZETIA) 10 MG tablet Take 1 tablet (10 mg total) by mouth daily. 90 tablet 3  . furosemide (LASIX) 20 MG tablet Take 10 mg by mouth daily.    . metoprolol tartrate (LOPRESSOR) 25 MG tablet  TAKE 1 TABLET (25 MG TOTAL) BY MOUTH 2 (TWO) TIMES DAILY. 180 tablet 0  . nitroGLYCERIN (NITROSTAT) 0.4 MG SL tablet Place 1 tablet (0.4 mg total) under the tongue every 5 (five) minutes as needed for chest pain. 25 tablet 11  . simvastatin (ZOCOR) 80 MG tablet Take 1 tablet (80 mg total) by mouth daily at 6 PM. 90 tablet 3   No current facility-administered medications for this visit.     Allergies  Allergen Reactions  . Latex Rash    Social History   Social History  . Marital status: Married    Spouse name: [name]  . Number of children: 0  . Years of education: N/A   Occupational History  . Retired     Passenger transport manager  .  Retired   Social History Main Topics  . Smoking status: Never Smoker  . Smokeless tobacco: Never Used  . Alcohol use 0.0 oz/week     Comment: socially  . Drug use: No  . Sexual activity: Yes   Other Topics Concern  . Not on file   Social History Narrative   Last name is pronounced "cas ka" No caffeine use. Always uses seat belts. Exercise:Moderate walks 30 min daily and rides a bike. Uses treadmill 20 mins everyday. Smoke alarm in home, No guns in the home.      Marital status:  Married x 15 years: happily married. Most of family in [place].      Children; none  Lives: with wife      Employment: retired.      Exercise:  Treadmill 20 minutes per day seven days per week.  Also walking throughout the day.      Advanced Directives: YES: FULL CODE.     Review of Systems: General: negative for chills, fever, night sweats or weight changes.  Cardiovascular: negative for chest pain, dyspnea on exertion, edema, orthopnea, palpitations, paroxysmal nocturnal dyspnea or shortness of breath Dermatological: negative for rash Respiratory: negative for cough or wheezing Urologic: negative for hematuria Abdominal: negative for nausea, vomiting, diarrhea, bright red blood per rectum, melena, or hematemesis Neurologic: negative for visual changes, syncope, or  dizziness All other systems reviewed and are otherwise negative except as noted above.    Blood pressure 134/72, pulse 64, height 5\' 6"  (1.676 m), weight 165 lb (74.8 kg).  General appearance: alert and no distress Neck: no adenopathy, no carotid bruit, no JVD, supple, symmetrical, trachea midline and thyroid not enlarged, symmetric, no tenderness/mass/nodules Lungs: clear to auscultation bilaterally Heart: regular rate and rhythm, S1, S2 normal, no murmur, click, rub or gallop Extremities: extremities normal, atraumatic, no cyanosis or edema  EKG sinus rhythm at 64 with left axis deviation. I personally reviewed this EKG.  ASSESSMENT AND PLAN:   CAD (coronary artery disease), hx CABG in 2006.  last cath 2012 with patent grafts.  History of CAD status post coronary artery bypass graft in 2006 with a LIMA to his LAD vein to the RCA, circumflex and diagonal branches is less Myoview performed August 2014 was nonischemic. He denies chest pain or shortness of breath.  HYPERLIPIDEMIA History of hyperlipidemia on statin therapy followed by his PCP  Essential hypertension, benign History of hypertension blood pressure measures 134/73. He is on metoprolol. Continue current meds at current dosing  Occlusion and stenosis of carotid artery History of carotid artery disease status post right carotid endarterectomy performed by Dr. Scot Dock in the past. We will follow respectively. Ultrasound which most recently was checked 05/18/16 revealing moderate stable left ICA stenosis with a widely patent endarterectomy site. We'll repeat this one year.  Peripheral arterial disease History of peripheral arterial disease with known aortoiliac left SFA disease. Does have mild left calf claudication with moderate exercise. His most recent Dopplers performed 07/01/15 revealed normal right ABI with a left ABI 0.89. We'll follow this on an every other year basis      [name] MD [facility],  [facility] 05/25/2016 11:22 AM

## 2016-05-25 NOTE — Patient Instructions (Signed)
Medication Instructions: Your physician recommends that you continue on your current medications as directed. Please refer to the Current Medication list given to you today.   Testing/Procedures: Your physician has requested that you have a lower extremity arterial duplex in January 2019. During this test, ultrasound is used to evaluate arterial blood flow in the legs. Allow one hour for this exam. There are no restrictions or special instructions.   Follow-Up: Your physician wants you to follow-up in: 1 year with Dr. Gwenlyn Found. You will receive a reminder letter in the mail two months in advance. If you don't receive a letter, please call our office to schedule the follow-up appointment.  If you need a refill on your cardiac medications before your next appointment, please call your pharmacy.

## 2016-06-08 ENCOUNTER — Other Ambulatory Visit: Payer: Self-pay | Admitting: Cardiovascular Disease

## 2016-06-08 DIAGNOSIS — I739 Peripheral vascular disease, unspecified: Secondary | ICD-10-CM

## 2016-06-08 DIAGNOSIS — I10 Essential (primary) hypertension: Secondary | ICD-10-CM

## 2016-06-08 NOTE — Telephone Encounter (Signed)
REFILL 

## 2016-07-07 ENCOUNTER — Other Ambulatory Visit: Payer: Self-pay | Admitting: Cardiovascular Disease

## 2016-07-21 ENCOUNTER — Other Ambulatory Visit: Payer: Self-pay | Admitting: Cardiology

## 2016-07-30 DIAGNOSIS — H6123 Impacted cerumen, bilateral: Secondary | ICD-10-CM | POA: Diagnosis not present

## 2016-10-04 DIAGNOSIS — R05 Cough: Secondary | ICD-10-CM | POA: Diagnosis not present

## 2016-10-04 DIAGNOSIS — J019 Acute sinusitis, unspecified: Secondary | ICD-10-CM | POA: Diagnosis not present

## 2016-10-12 DIAGNOSIS — N183 Chronic kidney disease, stage 3 (moderate): Secondary | ICD-10-CM | POA: Diagnosis not present

## 2016-10-12 DIAGNOSIS — E785 Hyperlipidemia, unspecified: Secondary | ICD-10-CM | POA: Diagnosis not present

## 2016-10-12 DIAGNOSIS — D631 Anemia in chronic kidney disease: Secondary | ICD-10-CM | POA: Diagnosis not present

## 2016-10-14 DIAGNOSIS — I129 Hypertensive chronic kidney disease with stage 1 through stage 4 chronic kidney disease, or unspecified chronic kidney disease: Secondary | ICD-10-CM | POA: Diagnosis not present

## 2016-10-14 DIAGNOSIS — N013 Rapidly progressive nephritic syndrome with diffuse mesangial proliferative glomerulonephritis: Secondary | ICD-10-CM | POA: Diagnosis not present

## 2016-10-14 DIAGNOSIS — I7090 Unspecified atherosclerosis: Secondary | ICD-10-CM | POA: Diagnosis not present

## 2016-10-14 DIAGNOSIS — E785 Hyperlipidemia, unspecified: Secondary | ICD-10-CM | POA: Diagnosis not present

## 2016-10-14 DIAGNOSIS — D751 Secondary polycythemia: Secondary | ICD-10-CM | POA: Diagnosis not present

## 2016-10-14 DIAGNOSIS — N183 Chronic kidney disease, stage 3 (moderate): Secondary | ICD-10-CM | POA: Diagnosis not present

## 2016-10-14 DIAGNOSIS — I739 Peripheral vascular disease, unspecified: Secondary | ICD-10-CM | POA: Diagnosis not present

## 2016-10-26 ENCOUNTER — Encounter: Payer: Self-pay | Admitting: Student

## 2016-10-26 ENCOUNTER — Ambulatory Visit (INDEPENDENT_AMBULATORY_CARE_PROVIDER_SITE_OTHER): Payer: Medicare Other | Admitting: Student

## 2016-10-26 DIAGNOSIS — J069 Acute upper respiratory infection, unspecified: Secondary | ICD-10-CM | POA: Diagnosis not present

## 2016-10-26 MED ORDER — BENZONATATE 100 MG PO CAPS: 100.0000 mg | ORAL_CAPSULE | Freq: Three times a day (TID) | ORAL | 0 refills | Status: DC | PRN

## 2016-10-26 NOTE — Assessment & Plan Note (Addendum)
Symptomatic care, plenty of fluids.  Doubt bacterial infection at this time, likely viral.  Recommended RTC if worsening symptoms- fevers, chills, productive cough, hemoptysis.  Would consider abx at that time, but does not appear to be indicated now. He was able to exercise without problems with chest or breathing, so doubt heart failure.  Tessalon perles for nighttime cough, has helped in past.

## 2016-10-26 NOTE — Progress Notes (Signed)
Subjective:    Patient ID: [name], male    DOB: 1940-07-04, 76 y.o.   MRN: [id number]  HPI He has had a nonproductive cough for 2 days, fatigue for a day.  He has also had a hoarse voice that started at the same time.  No sore throat.  No h/o seasonal allergies.  From [place], lived in [place].  No smoking history.  Occasional alcohol use.  No earache or nasal congestion, sore throat.  Has scratchy throat.  No chest pain or SOB.  No hemoptysis, nonproductive cough.  Worse at night while laying down.  He was able to walk/jog 18 mins today without symptoms.  He denies any issues with exercise.    PMHx - Updated and reviewed.  Contributory factors include: CAD with CABG in 2006.  H/o granulomatous disease of lungs I 2012.  H/o HTN, stenosis of carotid artery, PVC's, HLD PSHx - Updated and reviewed.  Contributory factors include:  CABG in 2006 FHx - Updated and reviewed.  Contributory factors include:  Negative Social Hx - Updated and reviewed. Contributory factors include: Negative Medications - reviewed    Review of Systems  Constitutional: Positive for fatigue. Negative for chills and fever.  HENT: Positive for voice change. Negative for congestion, ear pain, rhinorrhea, sinus pain, sinus pressure, sore throat and trouble swallowing.   Eyes: Negative for pain and itching.  Respiratory: Positive for cough. Negative for chest tightness, shortness of breath, wheezing and stridor.   Cardiovascular: Negative for chest pain and leg swelling.  Genitourinary: Negative for dysuria and urgency.  Musculoskeletal: Negative for joint swelling and myalgias.  Skin: Negative for pallor, rash and wound.  Neurological: Negative for dizziness and weakness.  All other systems reviewed and are negative.      Objective:   Physical Exam  Constitutional: He is oriented to person, place, and time. He appears well-developed and well-nourished. No distress.  HENT:  Head: Normocephalic and  atraumatic.  Right Ear: External ear normal.  Left Ear: External ear normal.  Nose: Nose normal.  Mouth/Throat: Oropharynx is clear and moist. No oropharyngeal exudate.  Eyes: Conjunctivae are normal. Pupils are equal, round, and reactive to light. Right eye exhibits no discharge. Left eye exhibits no discharge. No scleral icterus.  Neck: Normal range of motion. Neck supple.  Cardiovascular: Normal rate and regular rhythm.  Exam reveals no gallop and no friction rub.   No murmur heard. Pulmonary/Chest: Effort normal and breath sounds normal. No respiratory distress. He has no wheezes. He has no rales. He exhibits no tenderness.  Musculoskeletal: He exhibits no edema or deformity.  Lymphadenopathy:    He has no cervical adenopathy.  Neurological: He is alert and oriented to person, place, and time.  Skin: Skin is warm. No rash noted. He is not diaphoretic. No erythema. No pallor.  Psychiatric: He has a normal mood and affect. His behavior is normal. Judgment and thought content normal.   BP 119/67 (BP Location: Right Arm, Patient Position: Sitting, Cuff Size: Small)   Pulse 76   Temp 98 F (36.7 C) (Oral)   Resp 18   Ht 5\' 6"  (1.676 m)   Wt 161 lb 6.4 oz (73.2 kg)   SpO2 97%   BMI 26.05 kg/m         Assessment & Plan:  Acute URI Symptomatic care, plenty of fluids.  Doubt bacterial infection at this time, likely viral.  Recommended RTC if worsening symptoms- fevers, chills, productive cough, hemoptysis.  Would consider  abx at that time, but does not appear to be indicated now. He was able to exercise without problems with chest or breathing, so doubt heart failure.  Tessalon perles for nighttime cough, has helped in past.    Signed,  Balinda Quails, DO [facility] 3:50 PM 10/26/16

## 2016-10-26 NOTE — Patient Instructions (Addendum)
Upper Respiratory Infection, Adult Most upper respiratory infections (URIs) are caused by a virus. A URI affects the nose, throat, and upper air passages. The most common type of URI is often called "the common cold." Follow these instructions at home:  Take medicines only as told by your doctor.  Gargle warm saltwater or take cough drops to comfort your throat as told by your doctor.  Use a warm mist humidifier or inhale steam from a shower to increase air moisture. This may make it easier to breathe.  Drink enough fluid to keep your pee (urine) clear or pale yellow.  Eat soups and other clear broths.  Have a healthy diet.  Rest as needed.  Go back to work when your fever is gone or your doctor says it is okay.  You may need to stay home longer to avoid giving your URI to others.  You can also wear a face mask and wash your hands often to prevent spread of the virus.  Use your inhaler more if you have asthma.  Do not use any tobacco products, including cigarettes, chewing tobacco, or electronic cigarettes. If you need help quitting, ask your doctor. Contact a doctor if:  You are getting worse, not better.  Your symptoms are not helped by medicine.  You have chills.  You are getting more short of breath.  You have brown or red mucus.  You have yellow or brown discharge from your nose.  You have pain in your face, especially when you bend forward.  You have a fever.  You have puffy (swollen) neck glands.  You have pain while swallowing.  You have white areas in the back of your throat. Get help right away if:  You have very bad or constant:  Headache.  Ear pain.  Pain in your forehead, behind your eyes, and over your cheekbones (sinus pain).  Chest pain.  You have long-lasting (chronic) lung disease and any of the following:  Wheezing.  Long-lasting cough.  Coughing up blood.  A change in your usual mucus.  You have a stiff neck.  You have  changes in your:  Vision.  Hearing.  Thinking.  Mood. This information is not intended to replace advice given to you by your health care provider. Make sure you discuss any questions you have with your health care provider. Document Released: 11/17/2007 Document Revised: 02/01/2016 Document Reviewed: 09/05/2013 Elsevier Interactive Patient Education  2017 [employer].     IF you received an x-ray today, you will receive an invoice from [facility]. Please contact [facility] at [phone] with questions or concerns regarding your invoice.   IF you received labwork today, you will receive an invoice from [facility]. Please contact [facility] at [phone] with questions or concerns regarding your invoice.   Our billing staff will not be able to assist you with questions regarding bills from these companies.  You will be contacted with the lab results as soon as they are available. The fastest way to get your results is to activate your My Chart account. Instructions are located on the last page of this paperwork. If you have not heard from [place] regarding the results in 2 weeks, please contact this office.

## 2016-11-03 ENCOUNTER — Telehealth: Payer: Self-pay | Admitting: Family Medicine

## 2016-11-03 MED ORDER — AZITHROMYCIN 250 MG PO TABS: ORAL_TABLET | ORAL | 0 refills | Status: DC

## 2016-11-03 NOTE — Telephone Encounter (Signed)
Patient called --- cough is no better from visit one week ago.  Onset of cough six weeks ago intermittently.  S/p Amoxicillin on 10/04/16 with onset in [place].  Cough persisted. Rx for Azithromycin called in.  Will need follow-up if no improvement.  Wife advised.

## 2017-01-18 DIAGNOSIS — N183 Chronic kidney disease, stage 3 (moderate): Secondary | ICD-10-CM | POA: Diagnosis not present

## 2017-01-18 DIAGNOSIS — D631 Anemia in chronic kidney disease: Secondary | ICD-10-CM | POA: Diagnosis not present

## 2017-01-18 DIAGNOSIS — E785 Hyperlipidemia, unspecified: Secondary | ICD-10-CM | POA: Diagnosis not present

## 2017-01-22 ENCOUNTER — Ambulatory Visit (INDEPENDENT_AMBULATORY_CARE_PROVIDER_SITE_OTHER): Payer: Medicare Other | Admitting: Family Medicine

## 2017-01-22 ENCOUNTER — Encounter: Payer: Self-pay | Admitting: Family Medicine

## 2017-01-22 ENCOUNTER — Encounter: Payer: Self-pay | Admitting: *Deleted

## 2017-01-22 VITALS — BP 129/75 | HR 85 | Temp 98.0°F | Resp 16 | Ht 66.5 in | Wt 157.0 lb

## 2017-01-22 DIAGNOSIS — L290 Pruritus ani: Secondary | ICD-10-CM | POA: Diagnosis not present

## 2017-01-22 DIAGNOSIS — R195 Other fecal abnormalities: Secondary | ICD-10-CM | POA: Diagnosis not present

## 2017-01-22 DIAGNOSIS — K5900 Constipation, unspecified: Secondary | ICD-10-CM | POA: Diagnosis not present

## 2017-01-22 NOTE — Progress Notes (Signed)
Subjective:    Patient ID: [name], male    DOB: 04/13/1941, 76 y.o.   MRN: [id number]  HPI [name] is a 76 y.o. male Presents today for: Chief Complaint  Patient presents with  . Rectal Urgency    nothing come out properly, regular BM but not complete   Here for possible bowel issue past few days. Has noted difficulty expelling all of feces. Usually has BM's in am.  Has urge to defecate, but only small bits or chunks. Has had some discomfort at anus - felt raw with straining. Washing with warm towel then applying salve.  Slight watery stool this afternoon, with few bits. Feels like incomplete BM 2 days ago, but otherwise normal BM prior this week.   Has used stool softener every 6 months or so - took stool softener 2 days ago and lunch today (maybe 3 doses).  Usually coffee and OJ will cause BM's.   Similar issue few years ago, eventually passed large stool.    No diet changes recently. Did go on a trip few weeks ago, but no recent changes. Pizza earlier this week.  Increased gas few nights ago.   Patient Active Problem List   Diagnosis Date Noted  . Acute URI 10/26/2016  . Peripheral arterial disease ([place]) 03/07/2013  . Bradycardia 02/05/2013  . 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 CAD (coronary artery disease), hx CABG in 2006.  last cath 2012 with patent grafts.  02/05/2013  . Essential hypertension, benign 09/04/2012  . Pure hypercholesterolemia 09/04/2012  . Abdominal pain, epigastric 09/04/2012  . Liver function test abnormality 09/04/2012  . Coronary artery disease 09/04/2012  . Occlusion and stenosis of carotid artery 09/04/2012  . HYPERLIPIDEMIA 11/24/2009  . Disorder of kidney and ureter 11/24/2009  . CT, CHEST, ABNORMAL 11/24/2009   Past Medical History:  Diagnosis Date  . Anemia, unspecified   . Benign paroxysmal positional vertigo   . Blood transfusion without reported diagnosis   . CAD (coronary artery disease)    HX CABG 2006 X 4;  last nuc 11/2010 last cath 2012-wth 95% OM too small for intervention, treated medically   . Carcinoma in situ of skin, site unspecified   . Cerebrovascular disease, unspecified    hx CEA-RT; KNOWN AOTOILIAC 7 LT sfa disease  . Chronic renal insufficiency    glomerulonephritis 6568127.5  . Congenital anomaly of tongue, unspecified   . Esophageal reflux   . Essential hypertension, benign   . Genital herpes, unspecified   . History of stress test 06/2010  . Hx of echocardiogram 01/2005   EF 55% normal study  . Lung mass   . PAF (paroxysmal atrial fibrillation) ([place])    post CABG, maintaining SR  . Peripheral vascular disease, unspecified ([place])   . Pure hypercholesterolemia    Past Surgical History:  Procedure Laterality Date  . CARDIAC CATHETERIZATION  06/2010  . CAROTID ENDARTERECTOMY  2007   Right  . CHOLECYSTECTOMY  07/2010  . CORONARY ARTERY BYPASS GRAFT  05/12/2005   LIMA-LAD, VG-DIAG; VG-LCX,OM; VG-RCA   Allergies  Allergen Reactions  . Latex Rash   Prior to Admission medications   Medication Sig Start Date End Date Taking? Authorizing Provider  acyclovir (ZOVIRAX) 400 MG tablet Take 1 tablet (400 mg total) by mouth 5 (five) times daily. Patient taking differently: Take 400 mg by mouth daily as needed.  09/09/15  Yes [name], MD  aspirin EC 81 MG tablet Take 81 mg by mouth  at bedtime.   Yes [provider]  Choline Fenofibrate (FENOFIBRIC ACID) 45 MG CPDR TAKE 1 CAPSULE (45MG ) BY MOUTH AT BEDTIME 06/08/16  Yes [name], MD  esomeprazole (NEXIUM) 40 MG capsule Take 40 mg by mouth as needed.    Yes [provider]  ezetimibe (ZETIA) 10 MG tablet TAKE 1 TABLET (10 MG TOTAL) BY MOUTH DAILY. 06/08/16  Yes [name], MD  furosemide (LASIX) 20 MG tablet Take 10 mg by mouth daily.   Yes [provider]  metoprolol tartrate (LOPRESSOR) 25 MG tablet TAKE 1 TABLET (25 MG TOTAL) BY MOUTH 2 (TWO) TIMES DAILY. 04/06/16  Yes [name], MD  nitroGLYCERIN (NITROSTAT) 0.4 MG SL tablet Place 1 tablet (0.4 mg total) under the tongue every 5 (five) minutes as needed for chest pain. 09/09/15  Yes [name], MD  simvastatin (ZOCOR) 80 MG tablet TAKE 1 TABLET (80 MG TOTAL) BY MOUTH DAILY AT 6 PM. 07/07/16  Yes [name], MD   Social History   Social History  . Marital status: Married    Spouse name: [name]  . Number of children: 0  . Years of education: N/A   Occupational History  . Retired     Passenger transport manager  .  Retired   Social History Main Topics  . Smoking status: Never Smoker  . Smokeless tobacco: Never Used  . Alcohol use 0.0 oz/week     Comment: socially  . Drug use: No  . Sexual activity: Yes   Other Topics Concern  . Not on file   Social History Narrative   Last name is pronounced "cas ka" No caffeine use. Always uses seat belts. Exercise:Moderate walks 30 min daily and rides a bike. Uses treadmill 20 mins everyday. Smoke alarm in home, No guns in the home.      Marital status:  Married x 15 years: happily married. Most of family in [place].      Children; none      Lives: with wife      Employment: retired.      Exercise:  Treadmill 20 minutes per day seven days per week.  Also walking throughout the day.      Advanced Directives: YES: FULL CODE.     Review of Systems  Constitutional: Negative for chills, diaphoresis, fever and unexpected weight change.  Gastrointestinal: Positive for anal bleeding (trace with wiping once. ), constipation (change in stool as above. ) and diarrhea. Negative for abdominal distention, abdominal pain, blood in stool (brown, no dark/tarry stool. ), nausea and vomiting.  Genitourinary: Negative for difficulty urinating, dysuria and hematuria.       Objective:   Physical Exam  Constitutional: He is oriented to person, place, and time. He appears well-developed and well-nourished.  HENT:  Head: Normocephalic and atraumatic.  Eyes: Pupils are equal,  round, and reactive to light. EOM are normal.  Neck: Carotid bruit is not present.  Cardiovascular: Normal rate, regular rhythm and normal heart sounds.   No murmur heard. Pulmonary/Chest: Effort normal and breath sounds normal.  Abdominal: Soft. Bowel sounds are normal. He exhibits no distension and no mass. There is no tenderness. There is no rebound and no guarding.  Genitourinary: Rectal exam shows no external hemorrhoid and no fissure.  Genitourinary Comments: Interval let appearance/erythema around anus without fissures or apparent lesions. Rectal exam otherwise deferred.  Musculoskeletal: He exhibits no edema.  Neurological: He is alert and oriented to person, place, and time.  Skin: Skin is warm and dry.  Psychiatric: He has a normal mood and affect.  Vitals reviewed.  Vitals:   01/22/17 1504  BP: 129/75  Pulse: 85  Resp: 16  Temp: 98 F (36.7 C)  TempSrc: Oral  SpO2: 96%  Weight: 157 lb (71.2 kg)  Height: 5' 6.5" (1.689 m)       Assessment & Plan:   [name] is a 76 y.o. male Constipation, unspecified constipation type  Change in stool  Pruritus ani  Based on description of symptoms, suspect she has some element of constipation with leak around with few watery type stools.  -Reassuring exam otherwise, and no constitutional symptoms, abdomen soft nontender, afebrile.  -Trial of MiraLAX twice a day to 3 times a day until normal bowel movements. If not improving in the next 2-3 days, would recommend repeat exam including rectal exam to rule out fecal impaction. RTC precautions if worsening sooner.  -May have a small element of pruritus ani intermittently, handout given on home treatments. rtc precautions.   No orders of the defined types were placed in this encounter.  Patient Instructions   Your symptoms sound like possible constipation. Try miralax twice per day for next 2-3 days or until normal bowel movements return.  If not improving into next week  (next 2-3 day)or any worsening of your symptoms sooner, please return here or emergency room sooner. If you are not improving in the few days, would recommend a rectal exam to make sure you do not have a fecal impaction.   If area around anus is irritated, that could be pruritus ani. See info below.    Constipation, Adult Constipation is when a person has fewer bowel movements in a week than normal, has difficulty having a bowel movement, or has stools that are dry, hard, or larger than normal. Constipation may be caused by an underlying condition. It may become worse with age if a person takes certain medicines and does not take in enough fluids. Follow these instructions at home: Eating and drinking   Eat foods that have a lot of fiber, such as fresh fruits and vegetables, whole grains, and beans.  Limit foods that are high in fat, low in fiber, or overly processed, such as french fries, hamburgers, cookies, candies, and soda.  Drink enough fluid to keep your urine clear or pale yellow. General instructions  Exercise regularly or as told by your health care provider.  Go to the restroom when you have the urge to go. Do not hold it in.  Take over-the-counter and prescription medicines only as told by your health care provider. These include any fiber supplements.  Practice pelvic floor retraining exercises, such as deep breathing while relaxing the lower abdomen and pelvic floor relaxation during bowel movements.  Watch your condition for any changes.  Keep all follow-up visits as told by your health care provider. This is important. Contact a health care provider if:  You have pain that gets worse.  You have a fever.  You do not have a bowel movement after 4 days.  You vomit.  You are not hungry.  You lose weight.  You are bleeding from the anus.  You have thin, pencil-like stools. Get help right away if:  You have a fever and your symptoms suddenly get worse.  You  leak stool or have blood in your stool.  Your abdomen is bloated.  You have severe pain in your abdomen.  You feel dizzy or you  faint. This information is not intended to replace advice given to you by your health care provider. Make sure you discuss any questions you have with your health care provider. Document Released: 02/27/2004 Document Revised: 12/19/2015 Document Reviewed: 11/19/2015 Elsevier Interactive Patient Education  2017 [place].   Anal Pruritus Anal pruritus is an itchy feeling in the anus and the skin in the anal area. This is common and can be caused by many things. It often occurs when the area becomes moist. Moisture may be due to sweating or a small amount of stool (feces) that is left on the area because of poor personal cleaning. Some other causes include:  Perfumed soaps and sprays.  Colored toilet paper.  Chemicals in the foods that you eat.  Dietary factors, such as caffeine, beer, milk products, chocolate, nuts, citrus fruits, tomatoes, spicy seasonings, jalapeno peppers, and salsa.  Hemorrhoids, fissures, infections, and other anal diseases.  Excessive washing.  Overuse of laxatives.  Skin disorders (psoriasis, eczema, or seborrhea).  Some medical disorders, such as diabetes or thyroid problems.  Diarrhea.  STDs (sexually transmitted diseases).  Some cancers.  In many cases, the cause is not known. The itching usually goes away with treatment and home care. Scratching can cause further skin damage. Follow these instructions at home: Pay attention to any changes in your symptoms. Take these actions to help with your itching: Skin Care  Practice good hygiene. ? Clean the anal area gently with wet toilet paper, baby wipes, or a wet washcloth after every bowel movement and at bedtime. ? Avoid using soaps on the anal area. ? Dry the area thoroughly. Pat the area dry with toilet paper or a towel.  Do not scrub the anal area with anything,  including toilet paper.  Do not scratch the itchy area. Scratching produces more damage and makes the itching worse.  Take sitz baths in warm water as told by your health care provider. Pat the area dry with a soft cloth after each bath.  Use creams or ointments as told by your health care provider. Zinc oxide ointment or a moisture barrier cream can be applied several times per day to protect the skin.  Do not use anything that irritates the skin, such as bubble baths, scented toilet paper, or genital deodorants. General instructions  Take over-the-counter and prescription medicines only as told by your health care provider.  Talk with your health care provider about fiber supplements. These are helpful in keeping your stool normal if you have frequent loose stools.  Wear cotton underwear and loose clothing.  Keep all follow-up visits as told by your health care provider. This is important. Contact a health care provider if:  Your itching does not improve in several days.  Your itching gets worse.  You have a fever.  You have redness, swelling, or pain in the anal area.  You have fluid, blood, or pus coming from the anal area. This information is not intended to replace advice given to you by your health care provider. Make sure you discuss any questions you have with your health care provider. Document Released: 11/30/2010 Document Revised: 11/06/2015 Document Reviewed: 08/26/2014 Elsevier Interactive Patient Education  2018 [employer].   IF you received an x-ray today, you will receive an invoice from [facility]. Please contact [facility] at [phone] with questions or concerns regarding your invoice.   IF you received labwork today, you will receive an invoice from [facility]. Please contact [facility] at [phone] with questions  or concerns regarding your invoice.   Our billing staff will not be able to assist you with questions regarding bills from  these companies.  You will be contacted with the lab results as soon as they are available. The fastest way to get your results is to activate your My Chart account. Instructions are located on the last page of this paperwork. If you have not heard from [place] regarding the results in 2 weeks, please contact this office.       I personally performed the services described in this documentation, which was scribed in my presence. The recorded information has been reviewed and considered for accuracy and completeness, addended by me as needed, and agree with information above.  Signed,   Merri Ray, MD [facility].  01/23/17 2:52 PM

## 2017-01-22 NOTE — Patient Instructions (Addendum)
Your symptoms sound like possible constipation. Try miralax twice per day for next 2-3 days or until normal bowel movements return.  If not improving into next week (next 2-3 day)or any worsening of your symptoms sooner, please return here or emergency room sooner. If you are not improving in the few days, would recommend a rectal exam to make sure you do not have a fecal impaction.   If area around anus is irritated, that could be pruritus ani. See info below.    Constipation, Adult Constipation is when a person has fewer bowel movements in a week than normal, has difficulty having a bowel movement, or has stools that are dry, hard, or larger than normal. Constipation may be caused by an underlying condition. It may become worse with age if a person takes certain medicines and does not take in enough fluids. Follow these instructions at home: Eating and drinking   Eat foods that have a lot of fiber, such as fresh fruits and vegetables, whole grains, and beans.  Limit foods that are high in fat, low in fiber, or overly processed, such as french fries, hamburgers, cookies, candies, and soda.  Drink enough fluid to keep your urine clear or pale yellow. General instructions  Exercise regularly or as told by your health care provider.  Go to the restroom when you have the urge to go. Do not hold it in.  Take over-the-counter and prescription medicines only as told by your health care provider. These include any fiber supplements.  Practice pelvic floor retraining exercises, such as deep breathing while relaxing the lower abdomen and pelvic floor relaxation during bowel movements.  Watch your condition for any changes.  Keep all follow-up visits as told by your health care provider. This is important. Contact a health care provider if:  You have pain that gets worse.  You have a fever.  You do not have a bowel movement after 4 days.  You vomit.  You are not hungry.  You lose  weight.  You are bleeding from the anus.  You have thin, pencil-like stools. Get help right away if:  You have a fever and your symptoms suddenly get worse.  You leak stool or have blood in your stool.  Your abdomen is bloated.  You have severe pain in your abdomen.  You feel dizzy or you faint. This information is not intended to replace advice given to you by your health care provider. Make sure you discuss any questions you have with your health care provider. Document Released: 02/27/2004 Document Revised: 12/19/2015 Document Reviewed: 11/19/2015 Elsevier Interactive Patient Education  2017 [place].   Anal Pruritus Anal pruritus is an itchy feeling in the anus and the skin in the anal area. This is common and can be caused by many things. It often occurs when the area becomes moist. Moisture may be due to sweating or a small amount of stool (feces) that is left on the area because of poor personal cleaning. Some other causes include:  Perfumed soaps and sprays.  Colored toilet paper.  Chemicals in the foods that you eat.  Dietary factors, such as caffeine, beer, milk products, chocolate, nuts, citrus fruits, tomatoes, spicy seasonings, jalapeno peppers, and salsa.  Hemorrhoids, fissures, infections, and other anal diseases.  Excessive washing.  Overuse of laxatives.  Skin disorders (psoriasis, eczema, or seborrhea).  Some medical disorders, such as diabetes or thyroid problems.  Diarrhea.  STDs (sexually transmitted diseases).  Some cancers.  In many cases, the  cause is not known. The itching usually goes away with treatment and home care. Scratching can cause further skin damage. Follow these instructions at home: Pay attention to any changes in your symptoms. Take these actions to help with your itching: Skin Care  Practice good hygiene. ? Clean the anal area gently with wet toilet paper, baby wipes, or a wet washcloth after every bowel movement and  at bedtime. ? Avoid using soaps on the anal area. ? Dry the area thoroughly. Pat the area dry with toilet paper or a towel.  Do not scrub the anal area with anything, including toilet paper.  Do not scratch the itchy area. Scratching produces more damage and makes the itching worse.  Take sitz baths in warm water as told by your health care provider. Pat the area dry with a soft cloth after each bath.  Use creams or ointments as told by your health care provider. Zinc oxide ointment or a moisture barrier cream can be applied several times per day to protect the skin.  Do not use anything that irritates the skin, such as bubble baths, scented toilet paper, or genital deodorants. General instructions  Take over-the-counter and prescription medicines only as told by your health care provider.  Talk with your health care provider about fiber supplements. These are helpful in keeping your stool normal if you have frequent loose stools.  Wear cotton underwear and loose clothing.  Keep all follow-up visits as told by your health care provider. This is important. Contact a health care provider if:  Your itching does not improve in several days.  Your itching gets worse.  You have a fever.  You have redness, swelling, or pain in the anal area.  You have fluid, blood, or pus coming from the anal area. This information is not intended to replace advice given to you by your health care provider. Make sure you discuss any questions you have with your health care provider. Document Released: 11/30/2010 Document Revised: 11/06/2015 Document Reviewed: 08/26/2014 Elsevier Interactive Patient Education  2018 [employer].   IF you received an x-ray today, you will receive an invoice from [facility]. Please contact [facility] at [phone] with questions or concerns regarding your invoice.   IF you received labwork today, you will receive an invoice from [place]. Please  contact [facility] at [phone] with questions or concerns regarding your invoice.   Our billing staff will not be able to assist you with questions regarding bills from these companies.  You will be contacted with the lab results as soon as they are available. The fastest way to get your results is to activate your My Chart account. Instructions are located on the last page of this paperwork. If you have not heard from [place] regarding the results in 2 weeks, please contact this office.

## 2017-01-25 ENCOUNTER — Encounter: Payer: Self-pay | Admitting: Family Medicine

## 2017-01-25 ENCOUNTER — Ambulatory Visit (INDEPENDENT_AMBULATORY_CARE_PROVIDER_SITE_OTHER): Payer: Medicare Other

## 2017-01-25 ENCOUNTER — Ambulatory Visit (INDEPENDENT_AMBULATORY_CARE_PROVIDER_SITE_OTHER): Payer: Medicare Other | Admitting: Family Medicine

## 2017-01-25 VITALS — BP 128/82 | HR 68 | Temp 98.6°F | Resp 16 | Ht 66.5 in | Wt 158.0 lb

## 2017-01-25 DIAGNOSIS — R195 Other fecal abnormalities: Secondary | ICD-10-CM

## 2017-01-25 DIAGNOSIS — R194 Change in bowel habit: Secondary | ICD-10-CM | POA: Diagnosis not present

## 2017-01-25 DIAGNOSIS — K5900 Constipation, unspecified: Secondary | ICD-10-CM

## 2017-01-25 NOTE — Progress Notes (Signed)
Subjective:  By signing my name below, I, [name], attest that this documentation has been prepared under the direction and in the presence of Wendie Agreste, MD Electronically Signed: Ladene Artist, ED Scribe 01/25/2017 at 4:47 PM.   Patient ID: [name], male    DOB: Dec 02, 1940, 76 y.o.   MRN: [id number]  Chief Complaint  Patient presents with  . Follow-up    constipation "miralax not working completely"   HPI [name] is a 76 y.o. male who presents to Primary Care at [facility] for a follow-up on constipation. Pt was seen 3 days ago. At that time he was having suspected constipation. Recommended Miralx bid x 3 days then possible repeat exam to rule out impaction.   Today is his third day taking Miralax and he is still experiencing some intermittent constipation and abdominal distention. Pt reports having a several BMs today with 1 being normal and the others being "small chunks". Denies blood in stool, abdominal pain, nausea, vomiting, loss of appetite. Pt has not had a colonoscopy; apparently has had a sigmoidoscopy in the 5s. Stool testing for blood was negative 09/2015.    Patient Active Problem List   Diagnosis Date Noted  . Acute URI 10/26/2016  . Peripheral arterial disease ([place]) 03/07/2013  . Bradycardia 02/05/2013  . 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 CAD (coronary artery disease), hx CABG in 2006.  last cath 2012 with patent grafts.  02/05/2013  . Essential hypertension, benign 09/04/2012  . Pure hypercholesterolemia 09/04/2012  . Abdominal pain, epigastric 09/04/2012  . Liver function test abnormality 09/04/2012  . Coronary artery disease 09/04/2012  . Occlusion and stenosis of carotid artery 09/04/2012  . HYPERLIPIDEMIA 11/24/2009  . Disorder of kidney and ureter 11/24/2009  . CT, CHEST, ABNORMAL 11/24/2009   Past Medical History:  Diagnosis Date  . Anemia, unspecified   . Benign paroxysmal positional vertigo   . Blood transfusion  without reported diagnosis   . CAD (coronary artery disease)    HX CABG 2006 X 4; last nuc 11/2010 last cath 2012-wth 95% OM too small for intervention, treated medically   . Carcinoma in situ of skin, site unspecified   . Cerebrovascular disease, unspecified    hx CEA-RT; KNOWN AOTOILIAC 7 LT sfa disease  . Chronic renal insufficiency    glomerulonephritis 2119417.4  . Congenital anomaly of tongue, unspecified   . Esophageal reflux   . Essential hypertension, benign   . Genital herpes, unspecified   . History of stress test 06/2010  . Hx of echocardiogram 01/2005   EF 55% normal study  . Lung mass   . PAF (paroxysmal atrial fibrillation) ([place])    post CABG, maintaining SR  . Peripheral vascular disease, unspecified ([place])   . Pure hypercholesterolemia    Past Surgical History:  Procedure Laterality Date  . CARDIAC CATHETERIZATION  06/2010  . CAROTID ENDARTERECTOMY  2007   Right  . CHOLECYSTECTOMY  07/2010  . CORONARY ARTERY BYPASS GRAFT  05/12/2005   LIMA-LAD, VG-DIAG; VG-LCX,OM; VG-RCA   Allergies  Allergen Reactions  . Latex Rash   Prior to Admission medications   Medication Sig Start Date End Date Taking? Authorizing Provider  acyclovir (ZOVIRAX) 400 MG tablet Take 1 tablet (400 mg total) by mouth 5 (five) times daily. Patient taking differently: Take 400 mg by mouth daily as needed.  09/09/15   [name], MD  aspirin EC 81 MG tablet Take 81 mg by mouth at bedtime.  [provider]  Choline Fenofibrate (FENOFIBRIC ACID) 45 MG CPDR TAKE 1 CAPSULE (45MG) BY MOUTH AT BEDTIME 06/08/16   [name], MD  esomeprazole (NEXIUM) 40 MG capsule Take 40 mg by mouth as needed.     [provider]  ezetimibe (ZETIA) 10 MG tablet TAKE 1 TABLET (10 MG TOTAL) BY MOUTH DAILY. 06/08/16   [name], MD  furosemide (LASIX) 20 MG tablet Take 10 mg by mouth daily.    [provider]  metoprolol tartrate (LOPRESSOR) 25 MG tablet TAKE 1 TABLET (25  MG TOTAL) BY MOUTH 2 (TWO) TIMES DAILY. 04/06/16   [name], MD  nitroGLYCERIN (NITROSTAT) 0.4 MG SL tablet Place 1 tablet (0.4 mg total) under the tongue every 5 (five) minutes as needed for chest pain. 09/09/15   [name], MD  simvastatin (ZOCOR) 80 MG tablet TAKE 1 TABLET (80 MG TOTAL) BY MOUTH DAILY AT 6 PM. 07/07/16   [name], MD   Social History   Social History  . Marital status: Married    Spouse name: [name]  . Number of children: 0  . Years of education: N/A   Occupational History  . Retired     Passenger transport manager  .  Retired   Social History Main Topics  . Smoking status: Never Smoker  . Smokeless tobacco: Never Used  . Alcohol use 0.0 oz/week     Comment: socially  . Drug use: No  . Sexual activity: Yes   Other Topics Concern  . Not on file   Social History Narrative   Last name is pronounced "cas ka" No caffeine use. Always uses seat belts. Exercise:Moderate walks 30 min daily and rides a bike. Uses treadmill 20 mins everyday. Smoke alarm in home, No guns in the home.      Marital status:  Married x 15 years: happily married. Most of family in [place].      Children; none      Lives: with wife      Employment: retired.      Exercise:  Treadmill 20 minutes per day seven days per week.  Also walking throughout the day.      Advanced Directives: YES: FULL CODE.   Review of Systems  Constitutional: Negative for appetite change.  Gastrointestinal: Positive for abdominal distention and constipation. Negative for abdominal pain, blood in stool, nausea and vomiting.      Objective:   Physical Exam  Constitutional: He is oriented to person, place, and time. He appears well-developed and well-nourished. No distress.  HENT:  Head: Normocephalic and atraumatic.  Eyes: Conjunctivae and EOM are normal.  Neck: Neck supple. No tracheal deviation present.  Cardiovascular: Normal rate.   Pulmonary/Chest: Effort normal. No respiratory distress.    Genitourinary:  Genitourinary Comments: Discomfort but no fecal impaction appreciated. No blood noted on gross exam. Prominent prostate but prostate was non-tender.   Musculoskeletal: Normal range of motion.  Neurological: He is alert and oriented to person, place, and time.  Skin: Skin is warm and dry.  Psychiatric: He has a normal mood and affect. His behavior is normal.  Nursing note and vitals reviewed.  Vitals:   01/25/17 1629 01/25/17 1631  BP: (!) 150/72 128/82  Pulse: 68   Resp: 16   Temp: 98.6 F (37 C)   TempSrc: Oral   SpO2: 100%   Weight: 158 lb (71.7 kg)   Height: 5' 6.5" (1.689 m)    Dg Abd 1 [employer]  Result Date: 01/25/2017 CLINICAL DATA:  Decreasing bowel movements. EXAM: ABDOMEN - 1 VIEW COMPARISON:  08/03/2012 FINDINGS: No gaseous small bowel dilatation to suggest small bowel obstruction. No substantial stool volume evident. Thoracolumbar scoliosis noted. Bones are diffusely demineralized. IMPRESSION: No substantial stool volume. Electronically Signed   By: Misty Stanley M.D.   On: 01/25/2017 17:10   Results for orders placed or performed in visit on 09/09/15  CBC with Differential/Platelet  Result Value Ref Range   WBC 5.6 4.0 - 10.5 K/uL   RBC 5.69 4.22 - 5.81 MIL/uL   Hemoglobin 17.0 13.0 - 17.0 g/dL   HCT 50.0 39.0 - 52.0 %   MCV 87.9 78.0 - 100.0 fL   MCH 29.9 26.0 - 34.0 pg   MCHC 34.0 30.0 - 36.0 g/dL   RDW 13.4 11.5 - 15.5 %   Platelets 202 150 - 400 K/uL   MPV 10.3 8.6 - 12.4 fL   Neutrophils Relative % 68 43 - 77 %   Neutro Abs 3.8 1.7 - 7.7 K/uL   Lymphocytes Relative 19 12 - 46 %   Lymphs Abs 1.1 0.7 - 4.0 K/uL   Monocytes Relative 11 3 - 12 %   Monocytes Absolute 0.6 0.1 - 1.0 K/uL   Eosinophils Relative 1 0 - 5 %   Eosinophils Absolute 0.1 0.0 - 0.7 K/uL   Basophils Relative 1 0 - 1 %   Basophils Absolute 0.1 0.0 - 0.1 K/uL   Smear Review Criteria for review not met   Comprehensive metabolic panel  Result Value Ref Range   Sodium 137  135 - 146 mmol/L   Potassium 5.0 3.5 - 5.3 mmol/L   Chloride 101 98 - 110 mmol/L   CO2 25 20 - 31 mmol/L   Glucose, Bld 91 65 - 99 mg/dL   BUN 16 7 - 25 mg/dL   Creat 1.53 (H) 0.70 - 1.18 mg/dL   Total Bilirubin 0.5 0.2 - 1.2 mg/dL   Alkaline Phosphatase 65 40 - 115 U/L   AST 33 10 - 35 U/L   ALT 28 9 - 46 U/L   Total Protein 6.7 6.1 - 8.1 g/dL   Albumin 4.6 3.6 - 5.1 g/dL   Calcium 9.4 8.6 - 10.3 mg/dL  POCT urinalysis dipstick  Result Value Ref Range   Color, UA yellow yellow   Clarity, UA clear clear   Glucose, UA negative negative   Bilirubin, UA negative negative   Ketones, POC UA negative negative   Spec Grav, UA 1.010    Blood, UA trace-lysed (A) negative   pH, UA 5.5    Protein Ur, POC negative negative   Urobilinogen, UA 0.2    Nitrite, UA Negative Negative   Leukocytes, UA Negative Negative  POC Hemoccult Bld/Stl (3-Cd Home Screen)  Result Value Ref Range   Card #1 Date No Date    Fecal Occult Blood, POC Negative Negative   Card #2 Date No Date    Card #2 Fecal Occult Blod, POC Negative    Card #3 Date No Date    Card #3 Fecal Occult Blood, POC Negative        Assessment & Plan:  BURKE TERRY is a 76 y.o. male Constipation, unspecified constipation type - Plan: DG Abd 1 View, IFOBT POC (occult bld, rslt in office), Ambulatory referral to Gastroenterology  Change in stool - Plan: DG Abd 1 View, IFOBT POC (occult bld, rslt in office), Ambulatory referral to Gastroenterology  Initially on  August 11 thought to have possible constipation based on small stools. Reported increased bowel movement earlier in the morning, but then returned to urgency with minimal stool production. Recent change in stool caliber, but was not progressive. He has not had colonoscopy.   -On initial digital rectal exam, no fecal impaction, but did appreciate a prominent prostate.  No sign of excessive stool on abdomen x-ray.  DRE was repeated to verify that I did not appreciate a true  rectal mass. Posterior wall felt prominent in the area of prostate, but would recommend further evaluation with gastroenterology.  -With prominent prostate, recommended PSA, he declined at this time, plans to follow-up with his primary care provider to discuss further. Also without any new urinary symptoms.  - Refer to gastroenterology. Okay to start MiraLAX at this time. RTC precautions if worsening  No orders of the defined types were placed in this encounter.  Patient Instructions    I did not appreciate any stool on exam today. X-ray does not show a significant amount of stool either. Would recommend evaluation with gastroenterology given the change in stools recently, and no recent colonoscopy if these symptoms continue into next week.   At this point you can stop MiraLAX as no significant constipation seen on your x-ray.   If you have any worsening of symptoms including abdominal pain, blood in the stool, fevers, or other worsening symptoms, return here or emergency room if needed.  Prostate did feel prominent or possibly enlarged on exam. I would consider repeating your prostate test (PSA) especially if any new urinary symptoms.  This can also be discussed with your primary care provider.    IF you received an x-ray today, you will receive an invoice from [facility]. Please contact [facility] at [phone] with questions or concerns regarding your invoice.   IF you received labwork today, you will receive an invoice from [facility]. Please contact [facility] at [phone] with questions or concerns regarding your invoice.   Our billing staff will not be able to assist you with questions regarding bills from these companies.  You will be contacted with the lab results as soon as they are available. The fastest way to get your results is to activate your My Chart account. Instructions are located on the last page of this paperwork. If you have not heard from [place]  regarding the results in 2 weeks, please contact this office.       I personally performed the services described in this documentation, which was scribed in my presence. The recorded information has been reviewed and considered for accuracy and completeness, addended by me as needed, and agree with information above.  Signed,   Merri Ray, MD [facility].  01/26/17 4:31 PM

## 2017-01-25 NOTE — Patient Instructions (Addendum)
  I did not appreciate any stool on exam today. X-ray does not show a significant amount of stool either. Would recommend evaluation with gastroenterology given the change in stools recently, and no recent colonoscopy if these symptoms continue into next week.   At this point you can stop MiraLAX as no significant constipation seen on your x-ray.   If you have any worsening of symptoms including abdominal pain, blood in the stool, fevers, or other worsening symptoms, return here or emergency room if needed.  Prostate did feel prominent or possibly enlarged on exam. I would consider repeating your prostate test (PSA) especially if any new urinary symptoms.  This can also be discussed with your primary care provider.    IF you received an x-ray today, you will receive an invoice from [facility]. Please contact [facility] at [phone] with questions or concerns regarding your invoice.   IF you received labwork today, you will receive an invoice from [facility]. Please contact [facility] at [phone] with questions or concerns regarding your invoice.   Our billing staff will not be able to assist you with questions regarding bills from these companies.  You will be contacted with the lab results as soon as they are available. The fastest way to get your results is to activate your My Chart account. Instructions are located on the last page of this paperwork. If you have not heard from [place] regarding the results in 2 weeks, please contact this office.

## 2017-02-03 ENCOUNTER — Telehealth: Payer: Self-pay | Admitting: Family Medicine

## 2017-02-03 NOTE — Telephone Encounter (Signed)
Would still recommend evaluation with gastroenterology based on his exam last visit and no previous colonoscopy. I am glad to hear that his other symptoms have improved.

## 2017-02-03 NOTE — Telephone Encounter (Signed)
Called [name] to advise of appt with Dr. Benson Norway at [facility] on 02/07/17. The [name] said the problem has ceased and was wondering if he still needs to go or if he could wait until he sees Dr. Tamala Julian on 03/16/17 for CPE. Please advise. [name] can be contacted at [phone].

## 2017-02-07 NOTE — Telephone Encounter (Signed)
Pt canceled appt for today at [facility], but is still wanting referral but stated he wanted to wait to go until after his appointment with Dr. Tamala Julian on 03/16/17 so he can discuss this with her. Thanks!

## 2017-02-11 DIAGNOSIS — D2271 Melanocytic nevi of right lower limb, including hip: Secondary | ICD-10-CM | POA: Diagnosis not present

## 2017-02-11 DIAGNOSIS — L812 Freckles: Secondary | ICD-10-CM | POA: Diagnosis not present

## 2017-02-11 DIAGNOSIS — D225 Melanocytic nevi of trunk: Secondary | ICD-10-CM | POA: Diagnosis not present

## 2017-02-11 DIAGNOSIS — L821 Other seborrheic keratosis: Secondary | ICD-10-CM | POA: Diagnosis not present

## 2017-02-11 DIAGNOSIS — Z85828 Personal history of other malignant neoplasm of skin: Secondary | ICD-10-CM | POA: Diagnosis not present

## 2017-02-11 DIAGNOSIS — D0439 Carcinoma in situ of skin of other parts of face: Secondary | ICD-10-CM | POA: Diagnosis not present

## 2017-02-11 DIAGNOSIS — L820 Inflamed seborrheic keratosis: Secondary | ICD-10-CM | POA: Diagnosis not present

## 2017-02-11 DIAGNOSIS — D1801 Hemangioma of skin and subcutaneous tissue: Secondary | ICD-10-CM | POA: Diagnosis not present

## 2017-02-11 DIAGNOSIS — D485 Neoplasm of uncertain behavior of skin: Secondary | ICD-10-CM | POA: Diagnosis not present

## 2017-03-02 ENCOUNTER — Encounter: Payer: Medicare Other | Admitting: Family Medicine

## 2017-03-04 ENCOUNTER — Telehealth: Payer: Self-pay

## 2017-03-04 NOTE — Telephone Encounter (Signed)
Called pt to schedule Medicare Annual Wellness Visit prior to upcoming visit with PCP if possible.    [name], B.A.  Care Guide [phone]

## 2017-03-04 NOTE — Telephone Encounter (Signed)
Left VM to let pt know appointment time for AWV is 9:20a.m. not 9 a.m. Due to provider template.   If he has questions - [phone]  Josepha Pigg, B.A.  Care Guide [phone]

## 2017-03-15 NOTE — Progress Notes (Signed)
Subjective:    Patient ID: [name], male    DOB: [birth year], 76 y.o.   MRN: [id number]  03/16/2017  Annual Exam   HPI This 76 y.o. male presents for evaluation of chronic medical follow-up for CAD, hypercholesterolemia, PAD, carotid stenosis, renal insufficiency.  Doing well.  No concerns today.  S/p AWV today.   Last physical:  08-2015 Colonoscopy:  N/D; refuses; considering one later this year.   PSA:  2015 Eye exam:  Two years Dental exam:  Every six months.  Constipation:  Had a problem with constipation; milk has been causing problems.   Passing loose stools x 2-3.   Eliminated milk from diet and loose stools resolved.  No previous colonoscopy.  Considering pneumonia vaccines.  Does isolate self during flu vaccine.  Only goes to bank and pharmacy during flu vaccine.   HTN: Patient reports good compliance with medication, good tolerance to medication, and good symptom control.  Home BP ranges 100-140/60-90.    CAD: followed by cardiology annually.  Renal insufficiency: followed by nephrology annually.   Patient Care Team: [name], MD as PCP - General (Family Medicine) Donato Heinz, MD as Consulting Physician (Nephrology) [name], MD as Consulting Physician (Cardiology) [name], MD as Consulting Physician (Dermatology) Melissa Montane, MD as Consulting Physician (Otolaryngology)    Visual Acuity Screening   Right eye Left eye Both eyes  Without correction: 20/40 20/40 20/30   With correction:       BP Readings from Last 3 Encounters:  03/16/17 118/78  03/16/17 118/78  01/25/17 128/82   Wt Readings from Last 3 Encounters:  03/16/17 155 lb (70.3 kg)  03/16/17 155 lb (70.3 kg)  01/25/17 158 lb (71.7 kg)   Immunization History  Administered Date(s) Administered  . Tdap 03/02/2011    Review of Systems  Constitutional: Negative for activity change, appetite change, chills, diaphoresis, fatigue, fever and unexpected weight  change.  HENT: Positive for hearing loss and tinnitus. Negative for congestion, dental problem, drooling, ear discharge, ear pain, facial swelling, mouth sores, nosebleeds, postnasal drip, rhinorrhea, sinus pressure, sneezing, sore throat, trouble swallowing and voice change.   Eyes: Negative for photophobia, pain, discharge, redness, itching and visual disturbance.  Respiratory: Negative for apnea, cough, choking, chest tightness, shortness of breath, wheezing and stridor.   Cardiovascular: Negative for chest pain, palpitations and leg swelling.  Gastrointestinal: Negative for abdominal pain, blood in stool, constipation, diarrhea, nausea and vomiting.  Endocrine: Negative for cold intolerance, heat intolerance, polydipsia, polyphagia and polyuria.  Genitourinary: Negative for decreased urine volume, difficulty urinating, discharge, dysuria, enuresis, flank pain, frequency, genital sores, hematuria, penile pain, penile swelling, scrotal swelling, testicular pain and urgency.       Nocturia x 0-1.  Urinary stream strong.    Musculoskeletal: Negative for arthralgias, back pain, gait problem, joint swelling, myalgias, neck pain and neck stiffness.  Skin: Negative for color change, pallor, rash and wound.  Allergic/Immunologic: Negative for environmental allergies, food allergies and immunocompromised state.  Neurological: Negative for dizziness, tremors, seizures, syncope, facial asymmetry, speech difficulty, weakness, light-headedness, numbness and headaches.  Hematological: Negative for adenopathy. Does not bruise/bleed easily.  Psychiatric/Behavioral: Negative for agitation, behavioral problems, confusion, decreased concentration, dysphoric mood, hallucinations, self-injury, sleep disturbance and suicidal ideas. The patient is not nervous/anxious and is not hyperactive.     Past Medical History:  Diagnosis Date  . Anemia, unspecified   . Basal cell carcinoma (BCC) of right forearm    [name]   .  Benign paroxysmal positional vertigo   . Blood transfusion without reported diagnosis   . CAD (coronary artery disease)    HX CABG 2006 X 4; last nuc 11/2010 last cath 2012-wth 95% OM too small for intervention, treated medically   . Carcinoma in situ of skin, site unspecified   . Cerebrovascular disease, unspecified    hx CEA-RT; KNOWN AOTOILIAC 7 LT sfa disease  . Chronic renal insufficiency    glomerulonephritis 4315400.8  . Congenital anomaly of tongue, unspecified   . Esophageal reflux   . Essential hypertension, benign   . Genital herpes, unspecified   . History of stress test 06/2010  . Hx of echocardiogram 01/2005   EF 55% normal study  . Lung mass   . PAF (paroxysmal atrial fibrillation) ([place])    post CABG, maintaining SR  . Peripheral vascular disease, unspecified ([place])   . Pure hypercholesterolemia    Past Surgical History:  Procedure Laterality Date  . CARDIAC CATHETERIZATION  06/2010  . CAROTID ENDARTERECTOMY  2007   Right  . CHOLECYSTECTOMY  07/2010  . CORONARY ARTERY BYPASS GRAFT  05/12/2005   LIMA-LAD, VG-DIAG; VG-LCX,OM; VG-RCA   Allergies  Allergen Reactions  . Latex Rash   Current Outpatient Prescriptions  Medication Sig Dispense Refill  . acyclovir (ZOVIRAX) 400 MG tablet Take 1 tablet (400 mg total) by mouth 5 (five) times daily. 30 tablet 2  . aspirin EC 81 MG tablet Take 81 mg by mouth at bedtime.    . Choline Fenofibrate (FENOFIBRIC ACID) 45 MG CPDR TAKE 1 CAPSULE (45MG ) BY MOUTH AT BEDTIME 30 capsule 11  . esomeprazole (NEXIUM) 40 MG capsule Take 40 mg by mouth as needed.     . ezetimibe (ZETIA) 10 MG tablet TAKE 1 TABLET (10 MG TOTAL) BY MOUTH DAILY. 30 tablet 11  . furosemide (LASIX) 20 MG tablet Take 10 mg by mouth daily.    . metoprolol tartrate (LOPRESSOR) 25 MG tablet TAKE 1 TABLET (25 MG TOTAL) BY MOUTH 2 (TWO) TIMES DAILY. 180 tablet 0  . nitroGLYCERIN (NITROSTAT) 0.4 MG SL tablet Place 1 tablet (0.4 mg total) under the tongue every 5  (five) minutes as needed for chest pain. 25 tablet 3  . simvastatin (ZOCOR) 80 MG tablet TAKE 1 TABLET (80 MG TOTAL) BY MOUTH DAILY AT 6 PM. 90 tablet 2   No current facility-administered medications for this visit.    Social History   Social History  . Marital status: Married    Spouse name: [name]  . Number of children: 0  . Years of education: N/A   Occupational History  . Retired     Passenger transport manager  .  Retired   Social History Main Topics  . Smoking status: Never Smoker  . Smokeless tobacco: Never Used  . Alcohol use 0.0 oz/week     Comment: socially  . Drug use: No  . Sexual activity: Yes   Other Topics Concern  . Not on file   Social History Narrative   Last name is pronounced "cas ka" No caffeine use. Always uses seat belts. Exercise:Moderate walks 30 min daily and rides a bike. Uses treadmill 20 mins everyday. Smoke alarm in home, No guns in the home.      Marital status:  Married x 12 years: happily married. Most of family in [place].      Children; none      Lives: with wife      Employment: retired.      Tobacco; never  Alcohol: wine after lunch daily.      Exercise:  Treadmill 20 minutes per day seven days per week.  Also walking throughout the day.      Advanced Directives: YES: FULL CODE no prolonged measures.      ADLs:  Independent with ADLs; no assistant devices.  Drives.    Family History  Problem Relation Age of Onset  . Diabetes Mother   . Cancer Brother 14       colon cancer       Objective:    BP 118/78   Pulse 71   Temp 98 F (36.7 C) (Oral)   Resp 16   Ht 5\' 7"  (1.702 m)   Wt 155 lb (70.3 kg)   SpO2 98%   BMI 24.28 kg/m  Physical Exam  Constitutional: He is oriented to person, place, and time. He appears well-developed and well-nourished. No distress.  HENT:  Head: Normocephalic and atraumatic.  Right Ear: External ear normal.  Left Ear: External ear normal.  Nose: Nose normal.  Mouth/Throat: Oropharynx is clear and moist.    Eyes: Pupils are equal, round, and reactive to light. Conjunctivae and EOM are normal.  Neck: Normal range of motion. Neck supple. Carotid bruit is not present. No thyromegaly present.  Cardiovascular: Normal rate, regular rhythm, normal heart sounds and intact distal pulses.  Exam reveals no gallop and no friction rub.   No murmur heard. Pulmonary/Chest: Effort normal and breath sounds normal. He has no wheezes. He has no rales.  Abdominal: Soft. Bowel sounds are normal. He exhibits no distension and no mass. There is no tenderness. There is no rebound and no guarding. Hernia confirmed negative in the right inguinal area and confirmed negative in the left inguinal area.  Genitourinary: Testes normal and penis normal.  Musculoskeletal:       Right shoulder: Normal.       Left shoulder: Normal.       Cervical back: Normal.  Lymphadenopathy:    He has no cervical adenopathy.       Right: No inguinal adenopathy present.       Left: No inguinal adenopathy present.  Neurological: He is alert and oriented to person, place, and time. He has normal reflexes. No cranial nerve deficit. He exhibits normal muscle tone. Coordination normal.  Skin: Skin is warm and dry. No rash noted. He is not diaphoretic.  Psychiatric: He has a normal mood and affect. His behavior is normal. Judgment and thought content normal.    No results found. Depression screen [facility] 2/9 03/16/2017 03/16/2017 01/25/2017 10/26/2016 09/09/2015  Decreased Interest 0 0 0 0 0  Down, Depressed, Hopeless 0 0 0 0 0  PHQ - 2 Score 0 0 0 0 0   Fall Risk  03/16/2017 03/16/2017 01/25/2017 10/26/2016 09/09/2015  Falls in the past year? No No No No No   Functional Status Survey: Is the patient deaf or have difficulty hearing?: Yes Does the patient have difficulty seeing, even when wearing glasses/contacts?: No Does the patient have difficulty concentrating, remembering, or making decisions?: No Does the patient have difficulty walking or climbing  stairs?: No Does the patient have difficulty dressing or bathing?: No Does the patient have difficulty doing errands alone such as visiting a doctor's office or shopping?: No      Assessment & Plan:   1. Coronary artery disease due to lipid rich plaque   2. Essential hypertension, benign   3. Frequent unifocal PVCs   4. Peripheral arterial  disease ([place])   5. Pure hypercholesterolemia   6. Screening for prostate cancer   7. Coronary artery disease involving native coronary artery of native heart without angina pectoris   8. Family history of colon cancer    -stable chronic medical conditions; obtain labs for chronic disease management; refills provided. -patient considering colonoscopy this year despite age due to brother with colon cancer. -continue to follow-up with specialist on an annual basis. -anticipatory guidance provided --- exercise, weight loss, safe driving practices, aspirin 81mg  daily. -obtain age appropriate screening labs and labs for chronic disease management. -moderate fall risk; no evidence of depression; no evidence of hearing loss.  Discussed advanced directives and living will; also discussed end of life issues including code status.     Orders Placed This Encounter  Procedures  . CBC with Differential/Platelet  . Comprehensive metabolic panel    Order Specific Question:   Has the patient fasted?    Answer:   No  . Lipid panel    Order Specific Question:   Has the patient fasted?    Answer:   No  . PSA  . Urinalysis, dipstick only  . Urine Microscopic   Meds ordered this encounter  Medications  . acyclovir (ZOVIRAX) 400 MG tablet    Sig: Take 1 tablet (400 mg total) by mouth 5 (five) times daily.    Dispense:  30 tablet    Refill:  2  . nitroGLYCERIN (NITROSTAT) 0.4 MG SL tablet    Sig: Place 1 tablet (0.4 mg total) under the tongue every 5 (five) minutes as needed for chest pain.    Dispense:  25 tablet    Refill:  3    Return in about 1 year  (around 03/16/2018) for complete physical examiniation.   Kynnedy Carreno Elayne Guerin, M.D. Primary Care at [facility] [place] [address] [place], [place]  [address] [phone] phone [phone] fax

## 2017-03-16 ENCOUNTER — Encounter: Payer: Self-pay | Admitting: Family Medicine

## 2017-03-16 ENCOUNTER — Ambulatory Visit (INDEPENDENT_AMBULATORY_CARE_PROVIDER_SITE_OTHER): Payer: Medicare Other

## 2017-03-16 ENCOUNTER — Ambulatory Visit (INDEPENDENT_AMBULATORY_CARE_PROVIDER_SITE_OTHER): Payer: Medicare Other | Admitting: Family Medicine

## 2017-03-16 VITALS — BP 118/78 | HR 71 | Temp 98.0°F | Resp 16 | Ht 67.0 in | Wt 155.0 lb

## 2017-03-16 VITALS — BP 118/78 | HR 71 | Ht 67.0 in | Wt 155.0 lb

## 2017-03-16 DIAGNOSIS — Z800 Family history of malignant neoplasm of digestive organs: Secondary | ICD-10-CM | POA: Diagnosis not present

## 2017-03-16 DIAGNOSIS — I10 Essential (primary) hypertension: Secondary | ICD-10-CM

## 2017-03-16 DIAGNOSIS — I739 Peripheral vascular disease, unspecified: Secondary | ICD-10-CM

## 2017-03-16 DIAGNOSIS — I2583 Coronary atherosclerosis due to lipid rich plaque: Secondary | ICD-10-CM

## 2017-03-16 DIAGNOSIS — Z0000 Encounter for general adult medical examination without abnormal findings: Secondary | ICD-10-CM

## 2017-03-16 DIAGNOSIS — Z125 Encounter for screening for malignant neoplasm of prostate: Secondary | ICD-10-CM

## 2017-03-16 DIAGNOSIS — I2510 Atherosclerotic heart disease of native coronary artery without angina pectoris: Secondary | ICD-10-CM | POA: Diagnosis not present

## 2017-03-16 DIAGNOSIS — E7800 Pure hypercholesterolemia, unspecified: Secondary | ICD-10-CM | POA: Diagnosis not present

## 2017-03-16 DIAGNOSIS — I493 Ventricular premature depolarization: Secondary | ICD-10-CM

## 2017-03-16 MED ORDER — ACYCLOVIR 400 MG PO TABS: 400.0000 mg | ORAL_TABLET | Freq: Every day | ORAL | 2 refills | Status: DC

## 2017-03-16 MED ORDER — NITROGLYCERIN 0.4 MG SL SUBL: 0.4000 mg | SUBLINGUAL_TABLET | SUBLINGUAL | 3 refills | Status: DC | PRN

## 2017-03-16 NOTE — Patient Instructions (Addendum)
   IF you received an x-ray today, you will receive an invoice from [facility]. Please contact [facility] at [phone] with questions or concerns regarding your invoice.   IF you received labwork today, you will receive an invoice from [facility]. Please contact [facility] at [phone] with questions or concerns regarding your invoice.   Our billing staff will not be able to assist you with questions regarding bills from these companies.  You will be contacted with the lab results as soon as they are available. The fastest way to get your results is to activate your My Chart account. Instructions are located on the last page of this paperwork. If you have not heard from us regarding the results in 2 weeks, please contact this office.      Preventive Care 65 Years and Older, Male Preventive care refers to lifestyle choices and visits with your health care provider that can promote health and wellness. What does preventive care include?  A yearly physical exam. This is also called an annual well check.  Dental exams once or twice a year.  Routine eye exams. Ask your health care provider how often you should have your eyes checked.  Personal lifestyle choices, including: ? Daily care of your teeth and gums. ? Regular physical activity. ? Eating a healthy diet. ? Avoiding tobacco and drug use. ? Limiting alcohol use. ? Practicing safe sex. ? Taking low doses of aspirin every day. ? Taking vitamin and mineral supplements as recommended by your health care provider. What happens during an annual well check? The services and screenings done by your health care provider during your annual well check will depend on your age, overall health, lifestyle risk factors, and family history of disease. Counseling Your health care provider may ask you questions about your:  Alcohol use.  Tobacco use.  Drug use.  Emotional well-being.  Home and relationship  well-being.  Sexual activity.  Eating habits.  History of falls.  Memory and ability to understand (cognition).  Work and work environment.  Screening You may have the following tests or measurements:  Height, weight, and BMI.  Blood pressure.  Lipid and cholesterol levels. These may be checked every 5 years, or more frequently if you are over 50 years old.  Skin check.  Lung cancer screening. You may have this screening every year starting at age 55 if you have a 30-pack-year history of smoking and currently smoke or have quit within the past 15 years.  Fecal occult blood test (FOBT) of the stool. You may have this test every year starting at age 50.  Flexible sigmoidoscopy or colonoscopy. You may have a sigmoidoscopy every 5 years or a colonoscopy every 10 years starting at age 50.  Prostate cancer screening. Recommendations will vary depending on your family history and other risks.  Hepatitis C blood test.  Hepatitis B blood test.  Sexually transmitted disease (STD) testing.  Diabetes screening. This is done by checking your blood sugar (glucose) after you have not eaten for a while (fasting). You may have this done every 1-3 years.  Abdominal aortic aneurysm (AAA) screening. You may need this if you are a current or former smoker.  Osteoporosis. You may be screened starting at age 70 if you are at high risk.  Talk with your health care provider about your test results, treatment options, and if necessary, the need for more tests. Vaccines Your health care provider may recommend certain vaccines, such as:  Influenza vaccine. This   is recommended every year.  Tetanus, diphtheria, and acellular pertussis (Tdap, Td) vaccine. You may need a Td booster every 10 years.  Varicella vaccine. You may need this if you have not been vaccinated.  Zoster vaccine. You may need this after age 66.  Measles, mumps, and rubella (MMR) vaccine. You may need at least one dose of  MMR if you were born in 1957 or later. You may also need a second dose.  Pneumococcal 13-valent conjugate (PCV13) vaccine. One dose is recommended after age 60.  Pneumococcal polysaccharide (PPSV23) vaccine. One dose is recommended after age 68.  Meningococcal vaccine. You may need this if you have certain conditions.  Hepatitis A vaccine. You may need this if you have certain conditions or if you travel or work in places where you may be exposed to hepatitis A.  Hepatitis B vaccine. You may need this if you have certain conditions or if you travel or work in places where you may be exposed to hepatitis B.  Haemophilus influenzae type b (Hib) vaccine. You may need this if you have certain risk factors.  Talk to your health care provider about which screenings and vaccines you need and how often you need them. This information is not intended to replace advice given to you by your health care provider. Make sure you discuss any questions you have with your health care provider. Document Released: 06/27/2015 Document Revised: 02/18/2016 Document Reviewed: 04/01/2015 Elsevier Interactive Patient Education  2017 [employer].

## 2017-03-16 NOTE — Progress Notes (Signed)
Subjective:   [name] is a 76 y.o. male who presents for Medicare Annual/Subsequent preventive examination.  Review of Systems:  N/A Cardiac Risk Factors include: advanced age (>30men, >47 women);hypertension;dyslipidemia;male gender     Objective:    Vitals: BP 118/78   Pulse 71   Ht 5\' 7"  (1.702 m)   Wt 155 lb (70.3 kg)   SpO2 98%   BMI 24.28 kg/m   Body mass index is 24.28 kg/m.  Tobacco History  Smoking Status  . Never Smoker  Smokeless Tobacco  . Never Used     Counseling given: Not Answered   Past Medical History:  Diagnosis Date  . Anemia, unspecified   . Benign paroxysmal positional vertigo   . Blood transfusion without reported diagnosis   . CAD (coronary artery disease)    HX CABG 2006 X 4; last nuc 11/2010 last cath 2012-wth 95% OM too small for intervention, treated medically   . Carcinoma in situ of skin, site unspecified   . Cerebrovascular disease, unspecified    hx CEA-RT; KNOWN AOTOILIAC 7 LT sfa disease  . Chronic renal insufficiency    glomerulonephritis 2595638.7  . Congenital anomaly of tongue, unspecified   . Esophageal reflux   . Essential hypertension, benign   . Genital herpes, unspecified   . History of stress test 06/2010  . Hx of echocardiogram 01/2005   EF 55% normal study  . Lung mass   . PAF (paroxysmal atrial fibrillation) ([place])    post CABG, maintaining SR  . Peripheral vascular disease, unspecified ([place])   . Pure hypercholesterolemia    Past Surgical History:  Procedure Laterality Date  . CARDIAC CATHETERIZATION  06/2010  . CAROTID ENDARTERECTOMY  2007   Right  . CHOLECYSTECTOMY  07/2010  . CORONARY ARTERY BYPASS GRAFT  05/12/2005   LIMA-LAD, VG-DIAG; VG-LCX,OM; VG-RCA   Family History  Problem Relation Age of Onset  . Diabetes Mother    History  Sexual Activity  . Sexual activity: Yes    Outpatient Encounter Prescriptions as of 03/16/2017  Medication Sig  . acyclovir (ZOVIRAX) 400 MG tablet Take  1 tablet (400 mg total) by mouth 5 (five) times daily. (Patient taking differently: Take 400 mg by mouth daily as needed. )  . aspirin EC 81 MG tablet Take 81 mg by mouth at bedtime.  . Choline Fenofibrate (FENOFIBRIC ACID) 45 MG CPDR TAKE 1 CAPSULE (45MG ) BY MOUTH AT BEDTIME  . esomeprazole (NEXIUM) 40 MG capsule Take 40 mg by mouth as needed.   . ezetimibe (ZETIA) 10 MG tablet TAKE 1 TABLET (10 MG TOTAL) BY MOUTH DAILY.  . furosemide (LASIX) 20 MG tablet Take 10 mg by mouth daily.  . metoprolol tartrate (LOPRESSOR) 25 MG tablet TAKE 1 TABLET (25 MG TOTAL) BY MOUTH 2 (TWO) TIMES DAILY.  . nitroGLYCERIN (NITROSTAT) 0.4 MG SL tablet Place 1 tablet (0.4 mg total) under the tongue every 5 (five) minutes as needed for chest pain.  . simvastatin (ZOCOR) 80 MG tablet TAKE 1 TABLET (80 MG TOTAL) BY MOUTH DAILY AT 6 PM.   No facility-administered encounter medications on file as of 03/16/2017.     Activities of Daily Living In your present state of health, do you have any difficulty performing the following activities: 03/16/2017 10/26/2016  Hearing? Y Y  Comment Patient states that he has to turn the TV up to hear it and ask people to repeat what they are saying a lot.  pt states "just a little  loss"  Vision? N N  Difficulty concentrating or making decisions? N N  Walking or climbing stairs? N N  Dressing or bathing? N N  Doing errands, shopping? N N  Preparing Food and eating ? N -  Using the Toilet? N -  In the past six months, have you accidently leaked urine? N -  Do you have problems with loss of bowel control? N -  Managing your Medications? N -  Managing your Finances? N -  Housekeeping or managing your Housekeeping? N -  Some recent data might be hidden    Patient Care Team: [name], MD as PCP - General (Family Medicine) Donato Heinz, MD as Consulting Physician (Nephrology) [name], MD as Consulting Physician (Cardiology) [name], MD as Consulting  Physician (Dermatology) Melissa Montane, MD as Consulting Physician (Otolaryngology)   Assessment:     Exercise Activities and Dietary recommendations Current Exercise Habits: Home exercise routine, Type of exercise: walking;treadmill, Time (Minutes): 30, Frequency (Times/Week): 7, Weekly Exercise (Minutes/Week): 210, Intensity: Moderate, Exercise limited by: None identified  Goals    . Reduce caffeine intake          Patient states he will try to reduce his amount of caffeine intake daily. Drink less Coca-Cola.       Fall Risk Fall Risk  03/16/2017 01/25/2017 10/26/2016 09/09/2015 12/19/2013  Falls in the past year? No No No No No   Depression Screen PHQ 2/9 Scores 03/16/2017 01/25/2017 10/26/2016 09/09/2015  PHQ - 2 Score 0 0 0 0    Cognitive Function     6CIT Screen 03/16/2017  What Year? 0 points  What month? 0 points  What time? 0 points  Count back from 20 0 points  Months in reverse 0 points  Repeat phrase 2 points  Total Score 2    Immunization History  Administered Date(s) Administered  . Tdap 03/02/2011   Screening Tests Health Maintenance  Topic Date Due  . INFLUENZA VACCINE  03/16/2018 (Originally 01/12/2017)  . COLONOSCOPY  03/16/2018 (Originally 08/25/1991)  . PNA vac Low Risk Adult (1 of 2 - PCV13) 03/16/2018 (Originally 08/25/2006)  . TETANUS/TDAP  03/01/2021      Plan:   I have personally reviewed and noted the following in the patient's chart:   . Medical and social history . Use of alcohol, tobacco or illicit drugs  . Current medications and supplements . Functional ability and status . Nutritional status . Physical activity . Advanced directives . List of other physicians . Hospitalizations, surgeries, and ER visits in previous 12 months . Vitals . Screenings to include cognitive, depression, and falls . Referrals and appointments  In addition, I have reviewed and discussed with patient certain preventive protocols, quality metrics, and best practice  recommendations. A written personalized care plan for preventive services as well as general preventive health recommendations were provided to patient.  Patient declined pneumonia and flu vaccines. Patient declined colonoscopy at this time.    Andrez Grime, LPN  [id number]

## 2017-03-16 NOTE — Patient Instructions (Addendum)
[name] , Thank you for taking time to come for your Medicare Wellness Visit. I appreciate your ongoing commitment to your health goals. Please review the following plan we discussed and let me know if I can assist you in the future.   Screening recommendations/referrals: Colonoscopy: due, declined at this time due to traveling, will have this scheduled in the future Recommended yearly ophthalmology/optometry visit for glaucoma screening and checkup Recommended yearly dental visit for hygiene and checkup  Vaccinations: Influenza vaccine: declined Pneumococcal vaccine: declined Tdap vaccine: up to date, next due 03/01/2021 Shingles vaccine: declined  Advanced directives: Please bring a copy of your POA (Power of [place]) and/or Living Will to your next appointment.   Conditions/risks identified: Try to reduce your amount of caffeine intake daily. Drink less Coca-Cola.   Next appointment: 03/16/17 @ 1:40 pm with Dr. Tamala Julian   Preventive Care 75 Years and Older, Male Preventive care refers to lifestyle choices and visits with your health care provider that can promote health and wellness. What does preventive care include?  A yearly physical exam. This is also called an annual well check.  Dental exams once or twice a year.  Routine eye exams. Ask your health care provider how often you should have your eyes checked.  Personal lifestyle choices, including:  Daily care of your teeth and gums.  Regular physical activity.  Eating a healthy diet.  Avoiding tobacco and drug use.  Limiting alcohol use.  Practicing safe sex.  Taking low doses of aspirin every day.  Taking vitamin and mineral supplements as recommended by your health care provider. What happens during an annual well check? The services and screenings done by your health care provider during your annual well check will depend on your age, overall health, lifestyle risk factors, and family history of  disease. Counseling  Your health care provider may ask you questions about your:  Alcohol use.  Tobacco use.  Drug use.  Emotional well-being.  Home and relationship well-being.  Sexual activity.  Eating habits.  History of falls.  Memory and ability to understand (cognition).  Work and work Statistician. Screening  You may have the following tests or measurements:  Height, weight, and BMI.  Blood pressure.  Lipid and cholesterol levels. These may be checked every 5 years, or more frequently if you are over 30 years old.  Skin check.  Lung cancer screening. You may have this screening every year starting at age 35 if you have a 30-pack-year history of smoking and currently smoke or have quit within the past 15 years.  Fecal occult blood test (FOBT) of the stool. You may have this test every year starting at age 75.  Flexible sigmoidoscopy or colonoscopy. You may have a sigmoidoscopy every 5 years or a colonoscopy every 10 years starting at age 75.  Prostate cancer screening. Recommendations will vary depending on your family history and other risks.  Hepatitis C blood test.  Hepatitis B blood test.  Sexually transmitted disease (STD) testing.  Diabetes screening. This is done by checking your blood sugar (glucose) after you have not eaten for a while (fasting). You may have this done every 1-3 years.  Abdominal aortic aneurysm (AAA) screening. You may need this if you are a current or former smoker.  Osteoporosis. You may be screened starting at age 75 if you are at high risk. Talk with your health care provider about your test results, treatment options, and if necessary, the need for more tests. Vaccines  Your  health care provider may recommend certain vaccines, such as:  Influenza vaccine. This is recommended every year.  Tetanus, diphtheria, and acellular pertussis (Tdap, Td) vaccine. You may need a Td booster every 10 years.  Zoster vaccine. You may  need this after age 75.  Pneumococcal 13-valent conjugate (PCV13) vaccine. One dose is recommended after age 75.  Pneumococcal polysaccharide (PPSV23) vaccine. One dose is recommended after age 75. Talk to your health care provider about which screenings and vaccines you need and how often you need them. This information is not intended to replace advice given to you by your health care provider. Make sure you discuss any questions you have with your health care provider. Document Released: 06/27/2015 Document Revised: 02/18/2016 Document Reviewed: 04/01/2015 Elsevier Interactive Patient Education  2017 [place] Prevention in the Home Falls can cause injuries. They can happen to people of all ages. There are many things you can do to make your home safe and to help prevent falls. What can I do on the outside of my home?  Regularly fix the edges of walkways and driveways and fix any cracks.  Remove anything that might make you trip as you walk through a door, such as a raised step or threshold.  Trim any bushes or trees on the path to your home.  Use bright outdoor lighting.  Clear any walking paths of anything that might make someone trip, such as rocks or tools.  Regularly check to see if handrails are loose or broken. Make sure that both sides of any steps have handrails.  Any raised decks and porches should have guardrails on the edges.  Have any leaves, snow, or ice cleared regularly.  Use sand or salt on walking paths during winter.  Clean up any spills in your garage right away. This includes oil or grease spills. What can I do in the bathroom?  Use night lights.  Install grab bars by the toilet and in the tub and shower. Do not use towel bars as grab bars.  Use non-skid mats or decals in the tub or shower.  If you need to sit down in the shower, use a plastic, non-slip stool.  Keep the floor dry. Clean up any water that spills on the floor as soon as it  happens.  Remove soap buildup in the tub or shower regularly.  Attach bath mats securely with double-sided non-slip rug tape.  Do not have throw rugs and other things on the floor that can make you trip. What can I do in the bedroom?  Use night lights.  Make sure that you have a light by your bed that is easy to reach.  Do not use any sheets or blankets that are too big for your bed. They should not hang down onto the floor.  Have a firm chair that has side arms. You can use this for support while you get dressed.  Do not have throw rugs and other things on the floor that can make you trip. What can I do in the kitchen?  Clean up any spills right away.  Avoid walking on wet floors.  Keep items that you use a lot in easy-to-reach places.  If you need to reach something above you, use a strong step stool that has a grab bar.  Keep electrical cords out of the way.  Do not use floor polish or wax that makes floors slippery. If you must use wax, use non-skid floor wax.  Do not  have throw rugs and other things on the floor that can make you trip. What can I do with my stairs?  Do not leave any items on the stairs.  Make sure that there are handrails on both sides of the stairs and use them. Fix handrails that are broken or loose. Make sure that handrails are as long as the stairways.  Check any carpeting to make sure that it is firmly attached to the stairs. Fix any carpet that is loose or worn.  Avoid having throw rugs at the top or bottom of the stairs. If you do have throw rugs, attach them to the floor with carpet tape.  Make sure that you have a light switch at the top of the stairs and the bottom of the stairs. If you do not have them, ask someone to add them for you. What else can I do to help prevent falls?  Wear shoes that:  Do not have high heels.  Have rubber bottoms.  Are comfortable and fit you well.  Are closed at the toe. Do not wear sandals.  If you  use a stepladder:  Make sure that it is fully opened. Do not climb a closed stepladder.  Make sure that both sides of the stepladder are locked into place.  Ask someone to hold it for you, if possible.  Clearly mark and make sure that you can see:  Any grab bars or handrails.  First and last steps.  Where the edge of each step is.  Use tools that help you move around (mobility aids) if they are needed. These include:  Canes.  Walkers.  Scooters.  Crutches.  Turn on the lights when you go into a dark area. Replace any light bulbs as soon as they burn out.  Set up your furniture so you have a clear path. Avoid moving your furniture around.  If any of your floors are uneven, fix them.  If there are any pets around you, be aware of where they are.  Review your medicines with your doctor. Some medicines can make you feel dizzy. This can increase your chance of falling. Ask your doctor what other things that you can do to help prevent falls. This information is not intended to replace advice given to you by your health care provider. Make sure you discuss any questions you have with your health care provider. Document Released: 03/27/2009 Document Revised: 11/06/2015 Document Reviewed: 07/05/2014 Elsevier Interactive Patient Education  2017 [employer].

## 2017-03-17 LAB — CBC WITH DIFFERENTIAL/PLATELET
BASOS: 2 %
Basophils Absolute: 0.1 10*3/uL (ref 0.0–0.2)
EOS (ABSOLUTE): 0.1 10*3/uL (ref 0.0–0.4)
EOS: 3 %
HEMATOCRIT: 50.8 % (ref 37.5–51.0)
HEMOGLOBIN: 16.5 g/dL (ref 13.0–17.7)
IMMATURE GRANS (ABS): 0 10*3/uL (ref 0.0–0.1)
IMMATURE GRANULOCYTES: 0 %
LYMPHS: 16 %
Lymphocytes Absolute: 0.7 10*3/uL (ref 0.7–3.1)
MCH: 29 pg (ref 26.6–33.0)
MCHC: 32.5 g/dL (ref 31.5–35.7)
MCV: 89 fL (ref 79–97)
Monocytes Absolute: 0.4 10*3/uL (ref 0.1–0.9)
Monocytes: 9 %
NEUTROS ABS: 3 10*3/uL (ref 1.4–7.0)
Neutrophils: 70 %
Platelets: 151 10*3/uL (ref 150–379)
RBC: 5.69 x10E6/uL (ref 4.14–5.80)
RDW: 13.7 % (ref 12.3–15.4)
WBC: 4.3 10*3/uL (ref 3.4–10.8)

## 2017-03-17 LAB — COMPREHENSIVE METABOLIC PANEL
A/G RATIO: 2.4 — AB (ref 1.2–2.2)
ALBUMIN: 4.8 g/dL (ref 3.5–4.8)
ALT: 19 IU/L (ref 0–44)
AST: 25 IU/L (ref 0–40)
Alkaline Phosphatase: 72 IU/L (ref 39–117)
BILIRUBIN TOTAL: 0.5 mg/dL (ref 0.0–1.2)
BUN / CREAT RATIO: 9 — AB (ref 10–24)
BUN: 13 mg/dL (ref 8–27)
CALCIUM: 9.5 mg/dL (ref 8.6–10.2)
CHLORIDE: 102 mmol/L (ref 96–106)
CO2: 23 mmol/L (ref 20–29)
Creatinine, Ser: 1.43 mg/dL — ABNORMAL HIGH (ref 0.76–1.27)
GFR calc Af Amer: 55 mL/min/{1.73_m2} — ABNORMAL LOW (ref >59)
GFR, EST NON AFRICAN AMERICAN: 48 mL/min/{1.73_m2} — AB (ref >59)
Globulin, Total: 2 g/dL (ref 1.5–4.5)
Glucose: 79 mg/dL (ref 65–99)
POTASSIUM: 4.7 mmol/L (ref 3.5–5.2)
Sodium: 141 mmol/L (ref 134–144)
Total Protein: 6.8 g/dL (ref 6.0–8.5)

## 2017-03-17 LAB — LIPID PANEL
CHOL/HDL RATIO: 3.2 ratio (ref 0.0–5.0)
Cholesterol, Total: 133 mg/dL (ref 100–199)
HDL: 41 mg/dL (ref >39)
LDL Calculated: 64 mg/dL (ref 0–99)
Triglycerides: 141 mg/dL (ref 0–149)
VLDL CHOLESTEROL CAL: 28 mg/dL (ref 5–40)

## 2017-03-17 LAB — URINALYSIS, MICROSCOPIC ONLY
Bacteria, UA: NONE SEEN
Casts: NONE SEEN /lpf
EPITHELIAL CELLS (NON RENAL): NONE SEEN /HPF (ref 0–10)
RBC, UA: NONE SEEN /hpf (ref 0–?)

## 2017-03-17 LAB — URINALYSIS, DIPSTICK ONLY
Bilirubin, UA: NEGATIVE
GLUCOSE, UA: NEGATIVE
KETONES UA: NEGATIVE
LEUKOCYTES UA: NEGATIVE
Nitrite, UA: NEGATIVE
Protein, UA: NEGATIVE
RBC UA: NEGATIVE
SPEC GRAV UA: 1.013 (ref 1.005–1.030)
Urobilinogen, Ur: 0.2 mg/dL (ref 0.2–1.0)
pH, UA: 7 (ref 5.0–7.5)

## 2017-03-17 LAB — PSA: PROSTATE SPECIFIC AG, SERUM: 2.8 ng/mL (ref 0.0–4.0)

## 2017-03-22 DIAGNOSIS — Z800 Family history of malignant neoplasm of digestive organs: Secondary | ICD-10-CM | POA: Insufficient documentation

## 2017-04-06 ENCOUNTER — Other Ambulatory Visit: Payer: Self-pay | Admitting: Cardiovascular Disease

## 2017-04-12 ENCOUNTER — Other Ambulatory Visit: Payer: Self-pay | Admitting: Cardiovascular Disease

## 2017-04-12 NOTE — Telephone Encounter (Signed)
Rx(s) sent to pharmacy electronically.  

## 2017-05-06 ENCOUNTER — Other Ambulatory Visit: Payer: Self-pay | Admitting: Cardiovascular Disease

## 2017-05-09 NOTE — Telephone Encounter (Signed)
REFILL 

## 2017-05-18 ENCOUNTER — Encounter: Payer: Self-pay | Admitting: Family Medicine

## 2017-05-18 ENCOUNTER — Other Ambulatory Visit: Payer: Self-pay

## 2017-05-18 ENCOUNTER — Ambulatory Visit (INDEPENDENT_AMBULATORY_CARE_PROVIDER_SITE_OTHER): Payer: Medicare Other | Admitting: Family Medicine

## 2017-05-18 VITALS — BP 150/80 | HR 64 | Temp 97.7°F | Resp 16 | Ht 68.11 in | Wt 159.0 lb

## 2017-05-18 DIAGNOSIS — I10 Essential (primary) hypertension: Secondary | ICD-10-CM

## 2017-05-18 DIAGNOSIS — F430 Acute stress reaction: Secondary | ICD-10-CM | POA: Diagnosis not present

## 2017-05-18 DIAGNOSIS — I2583 Coronary atherosclerosis due to lipid rich plaque: Secondary | ICD-10-CM | POA: Diagnosis not present

## 2017-05-18 DIAGNOSIS — I2510 Atherosclerotic heart disease of native coronary artery without angina pectoris: Secondary | ICD-10-CM | POA: Diagnosis not present

## 2017-05-18 LAB — POCT URINALYSIS DIP (MANUAL ENTRY)
Bilirubin, UA: NEGATIVE
Blood, UA: NEGATIVE
Glucose, UA: NEGATIVE mg/dL
Ketones, POC UA: NEGATIVE mg/dL
LEUKOCYTES UA: NEGATIVE
Nitrite, UA: NEGATIVE
PH UA: 5.5 (ref 5.0–8.0)
PROTEIN UA: NEGATIVE mg/dL
SPEC GRAV UA: 1.01 (ref 1.010–1.025)
UROBILINOGEN UA: 0.2 U/dL

## 2017-05-18 NOTE — Patient Instructions (Addendum)
   Increase Metoprolol 25mg  to ONE TABLET every morning and 1/2 every evening.   If your blood pressure remains elevated, you can increase the evening dose to 1 tablet every evening.  IF you received an x-ray today, you will receive an invoice from [facility]. Please contact [facility] at [phone] with questions or concerns regarding your invoice.   IF you received labwork today, you will receive an invoice from [place]. Please contact [facility] at [phone] with questions or concerns regarding your invoice.   Our billing staff will not be able to assist you with questions regarding bills from these companies.  You will be contacted with the lab results as soon as they are available. The fastest way to get your results is to activate your My Chart account. Instructions are located on the last page of this paperwork. If you have not heard from [place] regarding the results in 2 weeks, please contact this office.

## 2017-05-18 NOTE — Progress Notes (Signed)
Subjective:    Patient ID: Jose KRAPF, male    DOB: [birth year], 76 y.o.   MRN: [id number]  05/18/2017  Hypertension (follow-up)    HPI This 76 y.o. male presents for evaluation of hypertension with recent elevation in blood pressure readings for the past several weeks which is a change from baseline for patient and causing concern.   Went to [place], blood pressure usually goes up with travel.  It has never recovered.   On 03/30/17 worked in yard for 30 minutes and did not tolerate well. Walking on treadmill yesterday. No aches or pains. No chest pain, palpitations, SOB/DOE, leg swelling; denies orthopnea; denies excessive fatigue. Panicked this morning.  Slept well.  BP this morning 170/90.  Waited a few minutes, same readings.  Have another machine; checked with different machine.  150/80.   Last visit with nephrology blood pressure was elevated.  Brother on morphine with hospice for colon cancer with metastasis; realizes brother's current status is a source of stress.  Bowels are normal; urinating is normal.    BP Readings from Last 3 Encounters:  05/19/17 (!) 155/64  05/18/17 (!) 150/80  03/16/17 118/78   Wt Readings from Last 3 Encounters:  05/18/17 159 lb (72.1 kg)  03/16/17 155 lb (70.3 kg)  03/16/17 155 lb (70.3 kg)   Immunization History  Administered Date(s) Administered  . Tdap 03/02/2011    Review of Systems  Constitutional: Negative for activity change, appetite change, chills, diaphoresis, fatigue and fever.  Respiratory: Negative for cough and shortness of breath.   Cardiovascular: Negative for chest pain, palpitations and leg swelling.  Gastrointestinal: Negative for abdominal pain, diarrhea, nausea and vomiting.  Endocrine: Negative for cold intolerance, heat intolerance, polydipsia, polyphagia and polyuria.  Skin: Negative for color change, rash and wound.  Neurological: Negative for dizziness, tremors, seizures, syncope, facial asymmetry,  speech difficulty, weakness, light-headedness, numbness and headaches.  Psychiatric/Behavioral: Negative for dysphoric mood and sleep disturbance. The patient is not nervous/anxious.     Past Medical History:  Diagnosis Date  . Anemia, unspecified   . Basal cell carcinoma (BCC) of right forearm    [name]  . Benign paroxysmal positional vertigo   . Blood transfusion without reported diagnosis   . CAD (coronary artery disease)    HX CABG 2006 X 4; last nuc 11/2010 last cath 2012-wth 95% OM too small for intervention, treated medically   . Carcinoma in situ of skin, site unspecified   . Cerebrovascular disease, unspecified    hx CEA-RT; KNOWN AOTOILIAC 7 LT sfa disease  . Chronic renal insufficiency    glomerulonephritis 4008676.1  . Congenital anomaly of tongue, unspecified   . Esophageal reflux   . Essential hypertension, benign   . Genital herpes, unspecified   . History of stress test 06/2010  . Hx of echocardiogram 01/2005   EF 55% normal study  . Lung mass   . PAF (paroxysmal atrial fibrillation) ([place])    post CABG, maintaining SR  . Peripheral vascular disease, unspecified ([place])   . Pure hypercholesterolemia    Past Surgical History:  Procedure Laterality Date  . CARDIAC CATHETERIZATION  06/2010  . CAROTID ENDARTERECTOMY  2007   Right  . CHOLECYSTECTOMY  07/2010  . CORONARY ARTERY BYPASS GRAFT  05/12/2005   LIMA-LAD, VG-DIAG; VG-LCX,OM; VG-RCA   Allergies  Allergen Reactions  . Latex Rash   Current Outpatient Medications on File Prior to Visit  Medication Sig Dispense Refill  . acyclovir (ZOVIRAX) 400 MG tablet  Take 1 tablet (400 mg total) by mouth 5 (five) times daily. 30 tablet 2  . aspirin EC 81 MG tablet Take 81 mg by mouth at bedtime.    . Choline Fenofibrate (FENOFIBRIC ACID) 45 MG CPDR TAKE 1 CAPSULE (45MG ) BY MOUTH AT BEDTIME 30 capsule 11  . esomeprazole (NEXIUM) 40 MG capsule Take 40 mg by mouth as needed.     . ezetimibe (ZETIA) 10 MG tablet TAKE 1  TABLET (10 MG TOTAL) BY MOUTH DAILY. 30 tablet 11  . furosemide (LASIX) 20 MG tablet Take 10 mg by mouth daily.    . metoprolol tartrate (LOPRESSOR) 25 MG tablet Take 1 tablet (25 mg total) by mouth 2 (two) times daily. NEED OV. 60 tablet 0  . nitroGLYCERIN (NITROSTAT) 0.4 MG SL tablet Place 1 tablet (0.4 mg total) under the tongue every 5 (five) minutes as needed for chest pain. 25 tablet 3  . simvastatin (ZOCOR) 80 MG tablet TAKE 1 TABLET (80 MG TOTAL) BY MOUTH DAILY AT 6 PM. 90 tablet 1   No current facility-administered medications on file prior to visit.    Social History   Socioeconomic History  . Marital status: Married    Spouse name: [name]  . Number of children: 0  . Years of education: Not on file  . Highest education level: Not on file  Social Needs  . Financial resource strain: Not on file  . Food insecurity - worry: Not on file  . Food insecurity - inability: Not on file  . Transportation needs - medical: Not on file  . Transportation needs - non-medical: Not on file  Occupational History  . Occupation: Retired    Comment: Theme park manager: RETIRED  Tobacco Use  . Smoking status: Never Smoker  . Smokeless tobacco: Never Used  Substance and Sexual Activity  . Alcohol use: Yes    Alcohol/week: 0.0 oz    Comment: socially  . Drug use: No  . Sexual activity: Yes  Other Topics Concern  . Not on file  Social History Narrative   Last name is pronounced "cas ka" No caffeine use. Always uses seat belts. Exercise:Moderate walks 30 min daily and rides a bike. Uses treadmill 20 mins everyday. Smoke alarm in home, No guns in the home.      Marital status:  Married x 12 years: happily married. Most of family in [place].      Children; none      Lives: with wife      Employment: retired.      Tobacco; never       Alcohol: wine after lunch daily.      Exercise:  Treadmill 20 minutes per day seven days per week.  Also walking throughout the day.      Advanced  Directives: YES: FULL CODE no prolonged measures.      ADLs:  Independent with ADLs; no assistant devices.  Drives.    Family History  Problem Relation Age of Onset  . Diabetes Mother   . Cancer Brother 60       colon cancer       Objective:    BP (!) 150/80   Pulse 64   Temp 97.7 F (36.5 C) (Oral)   Resp 16   Ht 5' 8.11" (1.73 m)   Wt 159 lb (72.1 kg)   SpO2 97%   BMI 24.10 kg/m  Physical Exam  Constitutional: He is oriented to person, place, and time. He appears well-developed  and well-nourished. No distress.  HENT:  Head: Normocephalic and atraumatic.  Right Ear: External ear normal.  Left Ear: External ear normal.  Nose: Nose normal.  Mouth/Throat: Oropharynx is clear and moist.  Eyes: Conjunctivae and EOM are normal. Pupils are equal, round, and reactive to light.  Neck: Normal range of motion. Neck supple. Carotid bruit is not present. No thyromegaly present.  Cardiovascular: Normal rate, regular rhythm, normal heart sounds and intact distal pulses. Exam reveals no gallop and no friction rub.  No murmur heard. Pulmonary/Chest: Effort normal and breath sounds normal. He has no wheezes. He has no rales.  Abdominal: Soft. Bowel sounds are normal. He exhibits no distension and no mass. There is no tenderness. There is no rebound and no guarding.  Lymphadenopathy:    He has no cervical adenopathy.  Neurological: He is alert and oriented to person, place, and time. No cranial nerve deficit. He exhibits normal muscle tone. Coordination normal.  Skin: Skin is warm and dry. No rash noted. He is not diaphoretic.  Psychiatric: He has a normal mood and affect. His behavior is normal.  Nursing note and vitals reviewed.  No results found. Depression screen [facility] 2/9 05/18/2017 03/16/2017 03/16/2017 01/25/2017 10/26/2016  Decreased Interest 0 0 0 0 0  Down, Depressed, Hopeless 0 0 0 0 0  PHQ - 2 Score 0 0 0 0 0   Fall Risk  05/18/2017 03/16/2017 03/16/2017 01/25/2017 10/26/2016  Falls  in the past year? No No No No No         Assessment & Plan:   1. Essential hypertension, benign   2. Coronary artery disease due to lipid rich plaque   3. Stress reaction     Recent elevation in blood pressure readings following travel and also with recent family stressors.  Brother currently on hospice for colon cancer with metastasis and maintained on morphine.  Will obtain labs today including conference of metabolic panel, urinalysis, CBC.  Recommend increasing metoprolol to 1 tablet every 1 at morning and one half every evening.  Can increase metoprolol to 1 tablet twice daily if necessary.  Patient advised to monitor heart rate with increasing metoprolol dose as baseline heart rate ranges between 50 and 65.  Patient advised to contact Dr. Alvester Chou of cardiology for an appointment in the upcoming few weeks.  Patient is due for his annual follow-up with cardiology thus warrants appointment.  EKG obtained in office today and stable.  Patient currently with stress reaction due to declining health of brother.  Patient denies active anxiety or depressive symptoms.  Consider benzodiazepine short-term if elevated blood pressure persist.  Recommend daily exercise for stress management.  Orders Placed This Encounter  Procedures  . CBC with Differential/Platelet  . Comprehensive metabolic panel  . POCT urinalysis dipstick  . EKG 12-Lead   No orders of the defined types were placed in this encounter.   No Follow-up on file.   Toniann Dickerson Elayne Guerin, M.D. Primary Care at [facility] previously [facility] [place] [address] [place], [place]  [phone] phone [phone] fax

## 2017-05-19 ENCOUNTER — Encounter (HOSPITAL_COMMUNITY): Payer: Self-pay | Admitting: Emergency Medicine

## 2017-05-19 ENCOUNTER — Emergency Department (HOSPITAL_COMMUNITY)
Admission: EM | Admit: 2017-05-19 | Discharge: 2017-05-19 | Disposition: A | Discharge status: Home / Self Care | Payer: Medicare Other | Attending: Emergency Medicine | Admitting: Emergency Medicine

## 2017-05-19 ENCOUNTER — Ambulatory Visit: Payer: Self-pay | Admitting: Hematology

## 2017-05-19 ENCOUNTER — Other Ambulatory Visit: Payer: Self-pay

## 2017-05-19 DIAGNOSIS — I10 Essential (primary) hypertension: Secondary | ICD-10-CM | POA: Insufficient documentation

## 2017-05-19 DIAGNOSIS — D649 Anemia, unspecified: Secondary | ICD-10-CM | POA: Diagnosis not present

## 2017-05-19 DIAGNOSIS — N2889 Other specified disorders of kidney and ureter: Secondary | ICD-10-CM | POA: Diagnosis not present

## 2017-05-19 DIAGNOSIS — Z79899 Other long term (current) drug therapy: Secondary | ICD-10-CM | POA: Diagnosis not present

## 2017-05-19 DIAGNOSIS — N289 Disorder of kidney and ureter, unspecified: Secondary | ICD-10-CM | POA: Diagnosis not present

## 2017-05-19 DIAGNOSIS — Z7982 Long term (current) use of aspirin: Secondary | ICD-10-CM | POA: Insufficient documentation

## 2017-05-19 DIAGNOSIS — I2510 Atherosclerotic heart disease of native coronary artery without angina pectoris: Secondary | ICD-10-CM | POA: Diagnosis not present

## 2017-05-19 LAB — COMPREHENSIVE METABOLIC PANEL
ALBUMIN: 4.6 g/dL (ref 3.5–4.8)
ALK PHOS: 69 IU/L (ref 39–117)
ALT: 19 IU/L (ref 0–44)
ALT: 23 U/L (ref 17–63)
AST: 27 IU/L (ref 0–40)
AST: 31 U/L (ref 15–41)
Albumin/Globulin Ratio: 2.1 (ref 1.2–2.2)
Albumin: 4.2 g/dL (ref 3.5–5.0)
Alkaline Phosphatase: 65 U/L (ref 38–126)
Anion gap: 9 (ref 5–15)
BUN / CREAT RATIO: 10 (ref 10–24)
BUN: 14 mg/dL (ref 8–27)
BUN: 14 mg/dL (ref 6–20)
Bilirubin Total: 0.3 mg/dL (ref 0.0–1.2)
CHLORIDE: 98 mmol/L (ref 96–106)
CHLORIDE: 96 mmol/L — AB (ref 101–111)
CO2: 24 mmol/L (ref 20–29)
CO2: 29 mmol/L (ref 22–32)
CREATININE: 1.66 mg/dL — AB (ref 0.61–1.24)
Calcium: 9.5 mg/dL (ref 8.6–10.2)
Calcium: 9.1 mg/dL (ref 8.9–10.3)
Creatinine, Ser: 1.42 mg/dL — ABNORMAL HIGH (ref 0.76–1.27)
GFR calc Af Amer: 45 mL/min — ABNORMAL LOW (ref >60)
GFR calc non Af Amer: 48 mL/min/{1.73_m2} — ABNORMAL LOW (ref >59)
GFR calc non Af Amer: 39 mL/min — ABNORMAL LOW (ref >60)
GFR, EST AFRICAN AMERICAN: 55 mL/min/{1.73_m2} — AB (ref >59)
Globulin, Total: 2.2 g/dL (ref 1.5–4.5)
Glucose, Bld: 98 mg/dL (ref 65–99)
Glucose: 81 mg/dL (ref 65–99)
POTASSIUM: 5 mmol/L (ref 3.5–5.2)
POTASSIUM: 4.4 mmol/L (ref 3.5–5.1)
SODIUM: 138 mmol/L (ref 134–144)
SODIUM: 134 mmol/L — AB (ref 135–145)
TOTAL PROTEIN: 6.8 g/dL (ref 6.0–8.5)
Total Bilirubin: 0.4 mg/dL (ref 0.3–1.2)
Total Protein: 6.2 g/dL — ABNORMAL LOW (ref 6.5–8.1)

## 2017-05-19 LAB — CBC WITH DIFFERENTIAL/PLATELET
Basophils Absolute: 0.1 10*3/uL (ref 0.0–0.2)
Basos: 2 %
EOS (ABSOLUTE): 0.1 10*3/uL (ref 0.0–0.4)
EOS: 2 %
HEMOGLOBIN: 17 g/dL (ref 13.0–17.7)
Hematocrit: 49.3 % (ref 37.5–51.0)
Immature Grans (Abs): 0 10*3/uL (ref 0.0–0.1)
Immature Granulocytes: 0 %
LYMPHS ABS: 0.8 10*3/uL (ref 0.7–3.1)
Lymphs: 17 %
MCH: 29.9 pg (ref 26.6–33.0)
MCHC: 34.5 g/dL (ref 31.5–35.7)
MCV: 87 fL (ref 79–97)
MONOCYTES: 11 %
Monocytes Absolute: 0.5 10*3/uL (ref 0.1–0.9)
NEUTROS ABS: 3.3 10*3/uL (ref 1.4–7.0)
Neutrophils: 68 %
Platelets: 181 10*3/uL (ref 150–379)
RBC: 5.68 x10E6/uL (ref 4.14–5.80)
RDW: 14.3 % (ref 12.3–15.4)
WBC: 4.9 10*3/uL (ref 3.4–10.8)

## 2017-05-19 LAB — CBC
HCT: 48.4 % (ref 39.0–52.0)
Hemoglobin: 16.8 g/dL (ref 13.0–17.0)
MCH: 30.1 pg (ref 26.0–34.0)
MCHC: 34.7 g/dL (ref 30.0–36.0)
MCV: 86.7 fL (ref 78.0–100.0)
PLATELETS: 175 10*3/uL (ref 150–400)
RBC: 5.58 MIL/uL (ref 4.22–5.81)
RDW: 13.3 % (ref 11.5–15.5)
WBC: 5.1 10*3/uL (ref 4.0–10.5)

## 2017-05-19 MED ORDER — AMLODIPINE BESYLATE 5 MG PO TABS
5.0000 mg | ORAL_TABLET | Freq: Once | ORAL | Status: AC
  Administered 2017-05-19: 5 mg via ORAL
  Filled 2017-05-19: qty 1

## 2017-05-19 MED ORDER — HYDROCHLOROTHIAZIDE 25 MG PO TABS: 25.0000 mg | ORAL_TABLET | Freq: Every day | ORAL | 0 refills | Status: DC

## 2017-05-19 NOTE — ED Triage Notes (Signed)
Pt presents to ED for assessment after noting his BP was 789 systolic yesterday morning.  Patient went to his PCP, and they upped his dose of metoprolol.  Patient states he noted today it's still high (200s in triage).  Denies chest pain, SOB, headache, dizziness, neuro deficits.  Pt axo at triage.

## 2017-05-19 NOTE — ED Provider Notes (Signed)
[facility] EMERGENCY DEPARTMENT Provider Note   CSN: [id number] Arrival date & time: 05/19/17  1637     History   Chief Complaint Chief Complaint  Patient presents with  . Hypertension    HPI [name] is a 76 y.o. male.  HPI Pt has been having trouble with elevated BP last few days.  He saw his doctor on wed.  She increased his metoprolol.  He was checking his blood pressure and it kept on increasing.  It went up to 637 systolic.  He called the office and they are going to see him tomorrow.  He was concerned about the drastic change so he came to the ED.  No CP or SOB.  NO dizziness.  He is not having any symptoms.  No change in the diet.   Past Medical History:  Diagnosis Date  . Anemia, unspecified   . Basal cell carcinoma (BCC) of right forearm    Jarome Price  . Benign paroxysmal positional vertigo   . Blood transfusion without reported diagnosis   . CAD (coronary artery disease)    HX CABG 2006 X 4; last nuc 11/2010 last cath 2012-wth 95% OM too small for intervention, treated medically   . Carcinoma in situ of skin, site unspecified   . Cerebrovascular disease, unspecified    hx CEA-RT; KNOWN AOTOILIAC 7 LT sfa disease  . Chronic renal insufficiency    glomerulonephritis 8588502.7  . Congenital anomaly of tongue, unspecified   . Esophageal reflux   . Essential hypertension, benign   . Genital herpes, unspecified   . History of stress test 06/2010  . Hx of echocardiogram 01/2005   EF 55% normal study  . Lung mass   . PAF (paroxysmal atrial fibrillation) ([place])    post CABG, maintaining SR  . Peripheral vascular disease, unspecified ([place])   . Pure hypercholesterolemia     Patient Active Problem List   Diagnosis Date Noted  . Family history of colon cancer 03/22/2017  . Acute URI 10/26/2016  . Peripheral arterial disease ([place]) 03/07/2013  . Bradycardia 02/05/2013  . 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 CAD (coronary artery disease),  hx CABG in 2006.  last cath 2012 with patent grafts.  02/05/2013  . Essential hypertension, benign 09/04/2012  . Pure hypercholesterolemia 09/04/2012  . Abdominal pain, epigastric 09/04/2012  . Liver function test abnormality 09/04/2012  . Coronary artery disease 09/04/2012  . Occlusion and stenosis of carotid artery 09/04/2012  . HYPERLIPIDEMIA 11/24/2009  . Disorder of kidney and ureter 11/24/2009  . CT, CHEST, ABNORMAL 11/24/2009    Past Surgical History:  Procedure Laterality Date  . CARDIAC CATHETERIZATION  06/2010  . CAROTID ENDARTERECTOMY  2007   Right  . CHOLECYSTECTOMY  07/2010  . CORONARY ARTERY BYPASS GRAFT  05/12/2005   LIMA-LAD, VG-DIAG; VG-LCX,OM; VG-RCA       Home Medications    Prior to Admission medications   Medication Sig Start Date End Date Taking? Authorizing Provider  acyclovir (ZOVIRAX) 400 MG tablet Take 1 tablet (400 mg total) by mouth 5 (five) times daily. 03/16/17   [name], MD  aspirin EC 81 MG tablet Take 81 mg by mouth at bedtime.    [provider]  Choline Fenofibrate (FENOFIBRIC ACID) 45 MG CPDR TAKE 1 CAPSULE (45MG ) BY MOUTH AT BEDTIME 06/08/16   [name], MD  esomeprazole (NEXIUM) 40 MG capsule Take 40 mg by mouth as needed.     [provider]  ezetimibe (ZETIA) 10 MG tablet TAKE 1 TABLET (10 MG TOTAL) BY MOUTH DAILY. 06/08/16   [name], MD  furosemide (LASIX) 20 MG tablet Take 10 mg by mouth daily.    [provider]  hydrochlorothiazide (HYDRODIURIL) 25 MG tablet Take 1 tablet (25 mg total) by mouth daily. 05/19/17   Dorie Rank, MD  metoprolol tartrate (LOPRESSOR) 25 MG tablet Take 1 tablet (25 mg total) by mouth 2 (two) times daily. NEED OV. 05/09/17   [name], MD  nitroGLYCERIN (NITROSTAT) 0.4 MG SL tablet Place 1 tablet (0.4 mg total) under the tongue every 5 (five) minutes as needed for chest pain. 03/16/17   [name], MD  simvastatin (ZOCOR) 80 MG tablet TAKE 1 TABLET  (80 MG TOTAL) BY MOUTH DAILY AT 6 PM. 04/06/17   [name], MD    Family History Family History  Problem Relation Age of Onset  . Diabetes Mother   . Cancer Brother 64       colon cancer    Social History Social History   Tobacco Use  . Smoking status: Never Smoker  . Smokeless tobacco: Never Used  Substance Use Topics  . Alcohol use: Yes    Alcohol/week: 0.0 oz    Comment: socially  . Drug use: No     Allergies   Latex   Review of Systems Review of Systems  All other systems reviewed and are negative.    Physical Exam Updated Vital Signs BP (!) 160/72   Pulse (!) 53   Resp 16   SpO2 99%   Physical Exam  Constitutional: He appears well-developed and well-nourished. No distress.  HENT:  Head: Normocephalic and atraumatic.  Right Ear: External ear normal.  Left Ear: External ear normal.  Eyes: Conjunctivae are normal. Right eye exhibits no discharge. Left eye exhibits no discharge. No scleral icterus.  Neck: Neck supple. No tracheal deviation present.  Cardiovascular: Normal rate, regular rhythm and intact distal pulses.  Pulmonary/Chest: Effort normal and breath sounds normal. No stridor. No respiratory distress. He has no wheezes. He has no rales.  Abdominal: Soft. Bowel sounds are normal. He exhibits no distension. There is no tenderness. There is no rebound and no guarding.  Musculoskeletal: He exhibits no edema or tenderness.  Neurological: He is alert. He has normal strength. No cranial nerve deficit (no facial droop, extraocular movements intact, no slurred speech) or sensory deficit. He exhibits normal muscle tone. He displays no seizure activity. Coordination normal.  Skin: Skin is warm and dry. No rash noted.  Psychiatric: He has a normal mood and affect.  Nursing note and vitals reviewed.    ED Treatments / Results  Labs (all labs ordered are listed, but only abnormal results are displayed) Labs Reviewed  COMPREHENSIVE METABOLIC PANEL  - Abnormal; Notable for the following components:      Result Value   Sodium 134 (*)    Chloride 96 (*)    Creatinine, Ser 1.66 (*)    Total Protein 6.2 (*)    GFR calc non Af Amer 39 (*)    GFR calc Af Amer 45 (*)    All other components within normal limits  CBC    EKG  EKG Interpretation  Date/Time:  Thursday May 19 2017 20:29:21 EST Ventricular Rate:  52 PR Interval:    QRS Duration: 101 QT Interval:  463 QTC Calculation: 431 R Axis:   -30 Text Interpretation:  Sinus rhythm Left axis deviation Low voltage,  precordial leads Baseline wander in lead(s) V4 No significant change since last tracing Confirmed by Dorie Rank [phone]) on 05/19/2017 8:32:38 PM       Radiology No results found.  Procedures Procedures (including critical care time)  Medications Ordered in ED Medications  amLODipine (NORVASC) tablet 5 mg (5 mg Oral Given 05/19/17 1941)     Initial Impression / Assessment and Plan / ED Course  I have reviewed the triage vital signs and the nursing notes.  Pertinent labs & imaging results that were available during my care of the patient were reviewed by me and considered in my medical decision making (see chart for details).  Clinical Course as of May 20 2131  Thu May 19, 2017  1924 Pt had an EKG in the office.  [JK]  1926 Cr increased compared to prior.  Will need to follow up on that.  [JK]    Clinical Course User Index [JK] Dorie Rank, MD    Patient presented to the emergency room for evaluation of hypertension.  He denies any symptoms.  Laboratory tests are notable for an increase in his creatinine.  I do not think this requires further intervention or treatment at this time but I have asked the patient follow-up with his primary doctor to have this rechecked.  Patient was given a dose of Norvasc and his symptoms improved.  I was going to prescribe him Norvasc however there appears to be an interaction between simvastatin and Norvasc.  We will give the  patient a prescription for hydrochlorothiazide.  Final Clinical Impressions(s) / ED Diagnoses   Final diagnoses:  Essential hypertension  Renal insufficiency    ED Discharge Orders        Ordered    hydrochlorothiazide (HYDRODIURIL) 25 MG tablet  Daily     05/19/17 2132       Dorie Rank, MD 05/19/17 2134

## 2017-05-19 NOTE — Telephone Encounter (Signed)
Patient states he saw Dr. Tamala Julian yesterday 05/18/17 and his B/P medication was adjusted.  Still this morning, B/P continues to rise.  Currently it is 200/80.  Patient states he skipped exercise class today and has been resting/relaxing all day. / Per triage disposition, offer an appointment W/I 24 hours.  Offered appt for 05/20/17 @ 0900 with [place].  Patient refused.  He said even though he doesn't have symptom of anything, he is afraid that something is going on since this has never happened to him before.  He says he has followed Dr. Thompson Caul instructions, so he was going to the ED for evaluation.  I advised I would send a note to the doctor.  Reason for Disposition . Systolic BP  >= 782 OR Diastolic >= 423  Answer Assessment - Initial Assessment Questions 1. BLOOD PRESSURE: "What is the blood pressure?" "Did you take at least two measurements 5 minutes apart?"     200/80 2. ONSET: "When did you take your blood pressure?"     This afternoon 3. HOW: "How did you obtain the blood pressure?" (e.g., visiting nurse, automatic home BP monitor)    Home B/P monitor 4. HISTORY: "Do you have a history of high blood pressure?"     yes 5. MEDICATIONS: "Are you taking any medications for blood pressure?" "Have you missed any doses recently?"    Metoprolol 6. OTHER SYMPTOMS: "Do you have any symptoms?" (e.g., headache, chest pain, blurred vision, difficulty breathing, weakness)     no  Protocols used: HIGH BLOOD PRESSURE-A-AH

## 2017-05-19 NOTE — Discharge Instructions (Signed)
Follow-up with your primary care doctor to review your kidney function and your blood pressure medications.

## 2017-05-20 NOTE — Telephone Encounter (Signed)
See note below

## 2017-05-30 DIAGNOSIS — H6123 Impacted cerumen, bilateral: Secondary | ICD-10-CM | POA: Diagnosis not present

## 2017-05-30 DIAGNOSIS — H900 Conductive hearing loss, bilateral: Secondary | ICD-10-CM | POA: Diagnosis not present

## 2017-06-02 ENCOUNTER — Other Ambulatory Visit (HOSPITAL_COMMUNITY): Payer: Self-pay | Admitting: Nephrology

## 2017-06-02 DIAGNOSIS — I10 Essential (primary) hypertension: Secondary | ICD-10-CM

## 2017-06-03 ENCOUNTER — Ambulatory Visit (INDEPENDENT_AMBULATORY_CARE_PROVIDER_SITE_OTHER): Payer: Medicare Other | Admitting: Cardiovascular Disease

## 2017-06-03 ENCOUNTER — Encounter: Payer: Self-pay | Admitting: Cardiovascular Disease

## 2017-06-03 DIAGNOSIS — I2583 Coronary atherosclerosis due to lipid rich plaque: Secondary | ICD-10-CM

## 2017-06-03 DIAGNOSIS — I2510 Atherosclerotic heart disease of native coronary artery without angina pectoris: Secondary | ICD-10-CM | POA: Diagnosis not present

## 2017-06-03 DIAGNOSIS — I739 Peripheral vascular disease, unspecified: Secondary | ICD-10-CM

## 2017-06-03 DIAGNOSIS — I10 Essential (primary) hypertension: Secondary | ICD-10-CM

## 2017-06-03 MED ORDER — HYDROCHLOROTHIAZIDE 25 MG PO TABS: 25.0000 mg | ORAL_TABLET | Freq: Every day | ORAL | 6 refills | Status: DC

## 2017-06-03 NOTE — Assessment & Plan Note (Signed)
History of hyperlipidemia on Zetia and Zocor profile performed 03/16/17 revealing LDL 64 and HDL 41.

## 2017-06-03 NOTE — Assessment & Plan Note (Signed)
History of essential hypertension blood pressure measured at 143/75. He is on metoprolol and hydrochlorothiazide. Continue current meds at current dosing

## 2017-06-03 NOTE — Progress Notes (Signed)
06/03/2017 [name]   1941/05/26  [id number]  Primary Physician [name], MD Primary Cardiologist: [name] MD FACP, [facility], [place], [place]  HPI:  [name] is a 76 y.o.  thin and fit-appearing married Caucasian male with no children, who I last saw in the office  05/25/16.[name] He has a history of CAD and PVOD. He had coronary artery bypass grafting in 2006 with a LIMA to his LAD, a vein to the RCA, circumflex, and diagonal branches. He did develop paroxysmal atrial fibrillation postoperatively. He has PVOD, status post right carotid endarterectomy performed by Dr. [name], as well as a known aortoiliac and left SFA disease, but he denies claudication except after walking seven or eight blocks in his left calf. His other problems include treated hypertension and dyslipidemia. He had a catheterization July 13, 2010, and found to have patent grafts with moderate aortoiliac disease. He does have moderate renal insufficiency followed by Dr. [name]. He has had laparoscopic cholecystectomy that was uncomplicated in the past.. He did have a negative Myoview stress test 8/14.. Since I saw him one year ago he's remained clinically stable denying chest pain or shortness of breath. He had a recent ER visit for isolated elevated systolic hypertension and was placed on Hytrin viral which seems to have improved his blood pressure readings. He does exercise on treadmill for 25 minutes a day with minimal claudication.      Current Meds  Medication Sig  . acyclovir (ZOVIRAX) 400 MG tablet Take 1 tablet (400 mg total) by mouth 5 (five) times daily.  [name] aspirin EC 81 MG tablet Take 81 mg by mouth at bedtime.  . Choline Fenofibrate (FENOFIBRIC ACID) 45 MG CPDR TAKE 1 CAPSULE (45MG ) BY MOUTH AT BEDTIME  . esomeprazole (NEXIUM) 40 MG capsule Take 40 mg by mouth as needed.   . ezetimibe (ZETIA) 10 MG tablet TAKE 1 TABLET (10 MG TOTAL) BY MOUTH DAILY.  . furosemide (LASIX) 20  MG tablet Take 10 mg by mouth daily.  . hydrochlorothiazide (HYDRODIURIL) 25 MG tablet Take 1 tablet (25 mg total) by mouth daily.  . metoprolol tartrate (LOPRESSOR) 25 MG tablet Take 1 tablet (25 mg total) by mouth 2 (two) times daily. NEED OV.  . nitroGLYCERIN (NITROSTAT) 0.4 MG SL tablet Place 1 tablet (0.4 mg total) under the tongue every 5 (five) minutes as needed for chest pain.  . simvastatin (ZOCOR) 80 MG tablet TAKE 1 TABLET (80 MG TOTAL) BY MOUTH DAILY AT 6 PM.     Allergies  Allergen Reactions  . Latex Rash    Social History   Socioeconomic History  . Marital status: Married    Spouse name: [name]  . Number of children: 0  . Years of education: Not on file  . Highest education level: Not on file  Social Needs  . Financial resource strain: Not on file  . Food insecurity - worry: Not on file  . Food insecurity - inability: Not on file  . Transportation needs - medical: Not on file  . Transportation needs - non-medical: Not on file  Occupational History  . Occupation: Retired    Comment: Theme park manager: RETIRED  Tobacco Use  . Smoking status: Never Smoker  . Smokeless tobacco: Never Used  Substance and Sexual Activity  . Alcohol use: Yes    Alcohol/week: 0.0 oz    Comment: socially  . Drug use: No  . Sexual activity: Yes  Other  Topics Concern  . Not on file  Social History Narrative   Last name is pronounced "cas ka" No caffeine use. Always uses seat belts. Exercise:Moderate walks 30 min daily and rides a bike. Uses treadmill 20 mins everyday. Smoke alarm in home, No guns in the home.      Marital status:  Married x 12 years: happily married. Most of family in [place].      Children; none      Lives: with wife      Employment: retired.      Tobacco; never       Alcohol: wine after lunch daily.      Exercise:  Treadmill 20 minutes per day seven days per week.  Also walking throughout the day.      Advanced Directives: YES: FULL CODE no prolonged  measures.      ADLs:  Independent with ADLs; no assistant devices.  Drives.      Review of Systems: General: negative for chills, fever, night sweats or weight changes.  Cardiovascular: negative for chest pain, dyspnea on exertion, edema, orthopnea, palpitations, paroxysmal nocturnal dyspnea or shortness of breath Dermatological: negative for rash Respiratory: negative for cough or wheezing Urologic: negative for hematuria Abdominal: negative for nausea, vomiting, diarrhea, bright red blood per rectum, melena, or hematemesis Neurologic: negative for visual changes, syncope, or dizziness All other systems reviewed and are otherwise negative except as noted above.    Blood pressure (!) 143/75, pulse 70, height 5\' 9"  (1.753 m), weight 160 lb (72.6 kg).  General appearance: alert and no distress Neck: no adenopathy, no carotid bruit, no JVD, supple, symmetrical, trachea midline and thyroid not enlarged, symmetric, no tenderness/mass/nodules Lungs: clear to auscultation bilaterally Heart: regular rate and rhythm, S1, S2 normal, no murmur, click, rub or gallop Extremities: extremities normal, atraumatic, no cyanosis or edema Pulses: 2+ and symmetric Skin: Skin color, texture, turgor normal. No rashes or lesions Neurologic: Alert and oriented X 3, normal strength and tone. Normal symmetric reflexes. Normal coordination and gait  EKG not performed today  ASSESSMENT AND PLAN:   CAD (coronary artery disease), hx CABG in 2006.  last cath 2012 with patent grafts.  History of CAD status post coronary artery bypass grafting in 2006 with a LIMA to his LAD, a vein to the RCA, circumflex and diagonal branches. Hip catheterization 06/16/10 the tongue was unhappy grafts with normal LV function and moderate aortoiliac disease. He had negative Myoview August 2014. He denies chest pain or shortness of breath.  HYPERLIPIDEMIA History of hyperlipidemia on Zetia and Zocor profile performed 03/16/17 revealing  LDL 64 and HDL 41.  Essential hypertension, benign History of essential hypertension blood pressure measured at 143/75. He is on metoprolol and hydrochlorothiazide. Continue current meds at current dosing  Occlusion and stenosis of carotid artery History of carotid artery disease status post right carotid endarterectomy performed by Dr. Doren Custard in the past. Carotid Dopplers performed 05/18/16 revealing a patent endarterectomy site with mild to moderate left ICA stenosis which we are following by duplex ultrasound on an annual basis.  Peripheral arterial disease History of peripheral arterial disease with known aortoiliac disease and left SFA disease. Dopplers performed 07/01/15 revealed a right ABI 1.1 and left 0.89. He does exercise for 25 minutes a day on a treadmill minimal limitation.      [name] MD FACP,FACC,FAHA, [facility] 06/03/2017 4:03 PM

## 2017-06-03 NOTE — Assessment & Plan Note (Signed)
History of carotid artery disease status post right carotid endarterectomy performed by Dr. Doren Custard in the past. Carotid Dopplers performed 05/18/16 revealing a patent endarterectomy site with mild to moderate left ICA stenosis which we are following by duplex ultrasound on an annual basis.

## 2017-06-03 NOTE — Addendum Note (Signed)
Addended by: Therisa Doyne on: 06/03/2017 04:32 PM   Modules accepted: Orders

## 2017-06-03 NOTE — Patient Instructions (Signed)

## 2017-06-03 NOTE — Assessment & Plan Note (Signed)
History of CAD status post coronary artery bypass grafting in 2006 with a LIMA to his LAD, a vein to the RCA, circumflex and diagonal branches. Hip catheterization 06/16/10 the tongue was unhappy grafts with normal LV function and moderate aortoiliac disease. He had negative Myoview August 2014. He denies chest pain or shortness of breath.

## 2017-06-03 NOTE — Assessment & Plan Note (Signed)
History of peripheral arterial disease with known aortoiliac disease and left SFA disease. Dopplers performed 07/01/15 revealed a right ABI 1.1 and left 0.89. He does exercise for 25 minutes a day on a treadmill minimal limitation.

## 2017-06-09 ENCOUNTER — Other Ambulatory Visit: Payer: Self-pay | Admitting: Cardiovascular Disease

## 2017-06-09 DIAGNOSIS — I10 Essential (primary) hypertension: Secondary | ICD-10-CM

## 2017-06-09 DIAGNOSIS — I739 Peripheral vascular disease, unspecified: Secondary | ICD-10-CM

## 2017-06-10 ENCOUNTER — Ambulatory Visit (HOSPITAL_BASED_OUTPATIENT_CLINIC_OR_DEPARTMENT_OTHER)
Admission: RE | Admit: 2017-06-10 | Discharge: 2017-06-10 | Disposition: A | Discharge status: Home / Self Care | Payer: Medicare Other | Source: Ambulatory Visit | Attending: Cardiovascular Disease | Admitting: Cardiovascular Disease

## 2017-06-10 ENCOUNTER — Ambulatory Visit (HOSPITAL_COMMUNITY)
Admission: RE | Admit: 2017-06-10 | Discharge: 2017-06-10 | Disposition: A | Discharge status: Home / Self Care | Payer: Medicare Other | Source: Ambulatory Visit | Attending: Cardiovascular Disease | Admitting: Cardiovascular Disease

## 2017-06-10 DIAGNOSIS — Z951 Presence of aortocoronary bypass graft: Secondary | ICD-10-CM | POA: Diagnosis not present

## 2017-06-10 DIAGNOSIS — I6529 Occlusion and stenosis of unspecified carotid artery: Secondary | ICD-10-CM

## 2017-06-10 DIAGNOSIS — E785 Hyperlipidemia, unspecified: Secondary | ICD-10-CM | POA: Insufficient documentation

## 2017-06-10 DIAGNOSIS — I739 Peripheral vascular disease, unspecified: Secondary | ICD-10-CM | POA: Diagnosis not present

## 2017-06-10 DIAGNOSIS — I6523 Occlusion and stenosis of bilateral carotid arteries: Secondary | ICD-10-CM | POA: Insufficient documentation

## 2017-06-10 DIAGNOSIS — I701 Atherosclerosis of renal artery: Secondary | ICD-10-CM | POA: Insufficient documentation

## 2017-06-10 DIAGNOSIS — I10 Essential (primary) hypertension: Secondary | ICD-10-CM

## 2017-06-10 DIAGNOSIS — I70203 Unspecified atherosclerosis of native arteries of extremities, bilateral legs: Secondary | ICD-10-CM | POA: Insufficient documentation

## 2017-06-16 ENCOUNTER — Other Ambulatory Visit: Payer: Self-pay | Admitting: Cardiovascular Disease

## 2017-06-16 DIAGNOSIS — I6529 Occlusion and stenosis of unspecified carotid artery: Secondary | ICD-10-CM

## 2017-06-20 ENCOUNTER — Other Ambulatory Visit: Payer: Self-pay | Admitting: Nephrology

## 2017-06-20 DIAGNOSIS — I701 Atherosclerosis of renal artery: Secondary | ICD-10-CM

## 2017-06-20 DIAGNOSIS — I10 Essential (primary) hypertension: Secondary | ICD-10-CM

## 2017-06-29 DIAGNOSIS — N058 Unspecified nephritic syndrome with other morphologic changes: Secondary | ICD-10-CM | POA: Diagnosis not present

## 2017-06-29 DIAGNOSIS — I701 Atherosclerosis of renal artery: Secondary | ICD-10-CM | POA: Diagnosis not present

## 2017-06-29 DIAGNOSIS — N183 Chronic kidney disease, stage 3 (moderate): Secondary | ICD-10-CM | POA: Diagnosis not present

## 2017-06-29 DIAGNOSIS — I739 Peripheral vascular disease, unspecified: Secondary | ICD-10-CM | POA: Diagnosis not present

## 2017-06-29 DIAGNOSIS — E785 Hyperlipidemia, unspecified: Secondary | ICD-10-CM | POA: Diagnosis not present

## 2017-06-29 DIAGNOSIS — N057 Unspecified nephritic syndrome with diffuse crescentic glomerulonephritis: Secondary | ICD-10-CM | POA: Diagnosis not present

## 2017-06-29 DIAGNOSIS — I129 Hypertensive chronic kidney disease with stage 1 through stage 4 chronic kidney disease, or unspecified chronic kidney disease: Secondary | ICD-10-CM | POA: Diagnosis not present

## 2017-06-29 DIAGNOSIS — I7090 Unspecified atherosclerosis: Secondary | ICD-10-CM | POA: Diagnosis not present

## 2017-06-29 DIAGNOSIS — D751 Secondary polycythemia: Secondary | ICD-10-CM | POA: Diagnosis not present

## 2017-07-13 ENCOUNTER — Ambulatory Visit
Admission: RE | Admit: 2017-07-13 | Discharge: 2017-07-13 | Disposition: A | Discharge status: Home / Self Care | Payer: Medicare Other | Source: Ambulatory Visit | Attending: Nephrology | Admitting: Nephrology

## 2017-07-13 DIAGNOSIS — I701 Atherosclerosis of renal artery: Secondary | ICD-10-CM | POA: Diagnosis not present

## 2017-07-13 DIAGNOSIS — I10 Essential (primary) hypertension: Secondary | ICD-10-CM

## 2017-07-13 HISTORY — PX: IR RADIOLOGIST EVAL & MGMT: IMG5224

## 2017-07-13 NOTE — Consult Note (Signed)
Chief Complaint: Patient was seen in consultation today for left renal artery stenosis and arteriogram and at the request of Coladonato,Joseph  Referring Physician(s): Coladonato,Joseph  History of Present Illness: [name] is a 77 y.o. 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 Serum creatinine was as high as 1.92 in past with recent stable baseline of 1.4-1.6.  He went to the emergency department in early December due to high blood pressure readings at home of systolic pressures around 200 mmHg. He was found to have a creatinine of 1.66. A renal artery duplex ultrasound was performed on 06/10/2017 demonstrating elevated velocity of 307 cm/s in the proximal left renal artery with renal artery ratio of 4.7. Velocity in the proximal right renal artery was 147 cm/s. There was note made of elevated velocity at the origin of the celiac axis of 347 cm/s. No significant velocity elevation at the level of the superior mesenteric artery.  [name] has a history of prior CABG in 2006 and is followed by Dr. Gwenlyn Found. He has stable left lower extremity performed after disease with some mild claudication but can walk for a mile without symptoms. He walks nearly every day on a treadmill. He has a history of right carotid endarterectomy in 2007 by Dr. Scot Dock.  Past Medical History:  Diagnosis Date  . Anemia, unspecified   . Basal cell carcinoma (BCC) of right forearm    [name]  . Benign paroxysmal positional vertigo   . Blood transfusion without reported diagnosis   . CAD (coronary artery disease)    HX CABG 2006 X 4; last nuc 11/2010 last cath 2012-wth 95% OM too small for intervention, treated medically   . Carcinoma in situ of skin, site unspecified   . Cerebrovascular disease, unspecified    hx CEA-RT; KNOWN AOTOILIAC 7 LT sfa disease  . Chronic renal insufficiency    glomerulonephritis  4158309.4  . Congenital anomaly of tongue, unspecified   . Esophageal reflux   . Essential hypertension, benign   . Genital herpes, unspecified   . History of stress test 06/2010  . Hx of echocardiogram 01/2005   EF 55% normal study  . Lung mass   . PAF (paroxysmal atrial fibrillation) ([place])    post CABG, maintaining SR  . Peripheral vascular disease, unspecified ([place])   . Pure hypercholesterolemia     Past Surgical History:  Procedure Laterality Date  . CARDIAC CATHETERIZATION  06/2010  . CAROTID ENDARTERECTOMY  2007   Right  . CHOLECYSTECTOMY  07/2010  . CORONARY ARTERY BYPASS GRAFT  05/12/2005   LIMA-LAD, VG-DIAG; VG-LCX,OM; VG-RCA  . IR RADIOLOGIST EVAL & MGMT  07/13/2017    Allergies: Adhesive [tape] and Latex  Medications: Prior to Admission medications   Medication Sig Start Date End Date Taking? Authorizing Provider  aspirin EC 81 MG tablet Take 81 mg by mouth at bedtime.   Yes [provider]  Choline Fenofibrate (FENOFIBRIC ACID) 45 MG CPDR TAKE 1 CAPSULE (45MG) BY MOUTH AT BEDTIME 06/09/17  Yes [name], MD  esomeprazole (NEXIUM) 40 MG capsule Take 40 mg by mouth as needed.    Yes [provider]  ezetimibe (ZETIA) 10 MG tablet TAKE 1 TABLET (10 MG TOTAL) BY MOUTH DAILY. 06/09/17  Yes [name], MD  furosemide (LASIX) 20 MG tablet Take 10 mg by mouth daily.   Yes [provider]  hydrochlorothiazide (HYDRODIURIL) 25 MG tablet  Take 1 tablet (25 mg total) by mouth daily. 06/03/17  Yes [name], MD  metoprolol tartrate (LOPRESSOR) 25 MG tablet TAKE 1 TABLET BY MOUTH TWO TIMES A DAY (NEEDS OFFICE VISIT FOR ADDITIONAL REFILLS) 06/09/17  Yes [name], MD  simvastatin (ZOCOR) 80 MG tablet TAKE 1 TABLET (80 MG TOTAL) BY MOUTH DAILY AT 6 PM. 04/06/17  Yes [name], MD  acyclovir (ZOVIRAX) 400 MG tablet Take 1 tablet (400 mg total) by mouth 5 (five) times daily. Patient not taking: Reported on 07/13/2017  03/16/17   [name], MD  nitroGLYCERIN (NITROSTAT) 0.4 MG SL tablet Place 1 tablet (0.4 mg total) under the tongue every 5 (five) minutes as needed for chest pain. Patient not taking: Reported on 07/13/2017 03/16/17   [name], MD     Family History  Problem Relation Age of Onset  . Diabetes Mother   . Cancer Brother 34       colon cancer    Social History   Socioeconomic History  . Marital status: Married    Spouse name: [name]  . Number of children: 0  . Years of education: None  . Highest education level: None  Social Needs  . Financial resource strain: None  . Food insecurity - worry: None  . Food insecurity - inability: None  . Transportation needs - medical: None  . Transportation needs - non-medical: None  Occupational History  . Occupation: Retired    Comment: Theme park manager: RETIRED  Tobacco Use  . Smoking status: Never Smoker  . Smokeless tobacco: Never Used  Substance and Sexual Activity  . Alcohol use: Yes    Alcohol/week: 0.0 oz    Comment: socially  . Drug use: No  . Sexual activity: Yes  Other Topics Concern  . None  Social History Narrative   Last name is pronounced "cas ka" No caffeine use. Always uses seat belts. Exercise:Moderate walks 30 min daily and rides a bike. Uses treadmill 20 mins everyday. Smoke alarm in home, No guns in the home.      Marital status:  Married x 12 years: happily married. Most of family in [place].      Children; none      Lives: with wife      Employment: retired.      Tobacco; never       Alcohol: wine after lunch daily.      Exercise:  Treadmill 20 minutes per day seven days per week.  Also walking throughout the day.      Advanced Directives: YES: FULL CODE no prolonged measures.      ADLs:  Independent with ADLs; no assistant devices.  Drives.     Review of Systems: A 12 point ROS discussed and pertinent positives are indicated in the HPI above.  All other systems are negative.  Review of  Systems  Constitutional: Negative.   HENT: Negative.   Respiratory: Negative.   Cardiovascular: Negative.   Gastrointestinal: Negative.   Genitourinary: Negative.   Musculoskeletal: Negative.   Neurological: Negative.     Vital Signs: BP 138/65   Pulse 63   Temp 98.2 F (36.8 C) (Oral)   Resp 14   Ht _0  (1.753 m)   Wt 160 lb (72.6 kg)   SpO2 99%   BMI 23.63 kg/m   Physical Exam  Constitutional: He is oriented to person, place, and time. He appears well-developed and well-nourished. No distress.  HENT:  Head: Normocephalic and atraumatic.  Neck: Neck supple. No JVD present. No tracheal deviation present. No thyromegaly present.  Cardiovascular: Normal rate, regular rhythm and normal heart sounds. Exam reveals no gallop and no friction rub.  No murmur heard. Pulmonary/Chest: Effort normal and breath sounds normal. No stridor. No respiratory distress. He has no wheezes. He has no rales.  Abdominal: Soft. Bowel sounds are normal. He exhibits no distension and no mass. There is no tenderness. There is no rebound and no guarding.  Musculoskeletal: He exhibits no edema.  Lymphadenopathy:    He has no cervical adenopathy.  Neurological: He is alert and oriented to person, place, and time.  Skin: Skin is warm and dry. He is not diaphoretic.  Vitals reviewed.    Imaging: Ir Radiologist Eval & Mgmt  Result Date: 07/13/2017 Please refer to notes tab for details about interventional procedure. (Op Note)   Labs:  CBC: Recent Labs    03/16/17 1443 05/18/17 1147 05/19/17 1647  WBC 4.3 4.9 5.1  HGB 16.5 17.0 16.8  HCT 50.8 49.3 48.4  PLT 151 181 175    COAGS: No results for input(s): INR, APTT in the last 8760 hours.  BMP: Recent Labs    03/16/17 1443 05/18/17 1147 05/19/17 1647  NA 141 138 134*  K 4.7 5.0 4.4  CL 102 98 96*  CO2 _0 GLUCOSE 79 81 98  BUN _1 CALCIUM 9.5 9.5 9.1  CREATININE 1.43* 1.42* 1.66*  GFRNONAA 48* 48* 39*  GFRAA 55*  55* 45*    LIVER FUNCTION TESTS: Recent Labs    03/16/17 1443 05/18/17 1147 05/19/17 1647  BILITOT 0.5 0.3 0.4  AST _2 ALT _3 ALKPHOS 72 69 65  PROT 6.8 6.8 6.2*  ALBUMIN 4.8 4.6 4.2    Assessment and Plan:  I met with [name]. We reviewed the duplex ultrasound findings. This is suggestive of potentially a hemodynamically significant proximal left renal artery stenosis. This could be confirmed with diagnostic CO2 arteriography including selective left renal arteriography. At the same time, if a significant stenosis is uncovered, this could be treated by placement of an intravascular stent. In the event of intervention, a small amount of diluted contrast material may be necessary to accurately place a stent and evaluate stent patency after deployment.  We discussed details of renal arteriography and intervascular stent placement including risks. Given chronic kidney disease, I would plan to bring [name] to the hospital several hours before a planned procedure in order to receive pre-procedural IV hydration. He would also receive hydration for several hours following the procedure should he receive any iodinated contrast.  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 I told him that this would not need to be studied or addressed unless he were to develop significant abdominal pain or intestinal angina.  After discussion, [name] would like to schedule renal arteriography with possible intervention. [facility] at [facility]. We will begin the scheduling process.  Thank you for this interesting consult.  I greatly enjoyed meeting [name] and look forward to participating in their care.  A copy of this report was sent to the requesting provider on this date.  Electronically SignedAletta Edouard  T 07/13/2017, 4:24 PM   I spent a total of 40 Minutes in face  to face in clinical consultation, greater than 50% of which was counseling/coordinating care for left renal artery stenosis.

## 2017-07-14 ENCOUNTER — Telehealth (HOSPITAL_COMMUNITY): Payer: Self-pay

## 2017-07-14 ENCOUNTER — Other Ambulatory Visit (HOSPITAL_COMMUNITY): Payer: Self-pay | Admitting: Interventional Radiology

## 2017-07-14 DIAGNOSIS — I701 Atherosclerosis of renal artery: Secondary | ICD-10-CM

## 2017-07-14 NOTE — Telephone Encounter (Signed)
Called to schedule renal angio, no answer, left message. AW

## 2017-07-21 ENCOUNTER — Telehealth (HOSPITAL_COMMUNITY): Payer: Self-pay

## 2017-08-01 DIAGNOSIS — L821 Other seborrheic keratosis: Secondary | ICD-10-CM | POA: Diagnosis not present

## 2017-08-01 DIAGNOSIS — Z85828 Personal history of other malignant neoplasm of skin: Secondary | ICD-10-CM | POA: Diagnosis not present

## 2017-08-01 DIAGNOSIS — L820 Inflamed seborrheic keratosis: Secondary | ICD-10-CM | POA: Diagnosis not present

## 2017-08-01 DIAGNOSIS — D1801 Hemangioma of skin and subcutaneous tissue: Secondary | ICD-10-CM | POA: Diagnosis not present

## 2017-08-08 ENCOUNTER — Other Ambulatory Visit: Payer: Self-pay | Admitting: Radiology

## 2017-08-09 ENCOUNTER — Other Ambulatory Visit (HOSPITAL_COMMUNITY): Payer: Self-pay | Admitting: Interventional Radiology

## 2017-08-09 ENCOUNTER — Encounter (HOSPITAL_COMMUNITY): Payer: Self-pay

## 2017-08-09 ENCOUNTER — Ambulatory Visit (HOSPITAL_COMMUNITY)
Admission: RE | Admit: 2017-08-09 | Discharge: 2017-08-09 | Disposition: A | Discharge status: Home / Self Care | Payer: Medicare Other | Source: Ambulatory Visit | Attending: Interventional Radiology | Admitting: Interventional Radiology

## 2017-08-09 DIAGNOSIS — Z951 Presence of aortocoronary bypass graft: Secondary | ICD-10-CM | POA: Insufficient documentation

## 2017-08-09 DIAGNOSIS — H8110 Benign paroxysmal vertigo, unspecified ear: Secondary | ICD-10-CM | POA: Diagnosis not present

## 2017-08-09 DIAGNOSIS — I2510 Atherosclerotic heart disease of native coronary artery without angina pectoris: Secondary | ICD-10-CM | POA: Insufficient documentation

## 2017-08-09 DIAGNOSIS — I739 Peripheral vascular disease, unspecified: Secondary | ICD-10-CM | POA: Insufficient documentation

## 2017-08-09 DIAGNOSIS — Z7982 Long term (current) use of aspirin: Secondary | ICD-10-CM | POA: Diagnosis not present

## 2017-08-09 DIAGNOSIS — K219 Gastro-esophageal reflux disease without esophagitis: Secondary | ICD-10-CM | POA: Insufficient documentation

## 2017-08-09 DIAGNOSIS — N189 Chronic kidney disease, unspecified: Secondary | ICD-10-CM | POA: Diagnosis not present

## 2017-08-09 DIAGNOSIS — Z91040 Latex allergy status: Secondary | ICD-10-CM | POA: Insufficient documentation

## 2017-08-09 DIAGNOSIS — I129 Hypertensive chronic kidney disease with stage 1 through stage 4 chronic kidney disease, or unspecified chronic kidney disease: Secondary | ICD-10-CM | POA: Diagnosis not present

## 2017-08-09 DIAGNOSIS — I679 Cerebrovascular disease, unspecified: Secondary | ICD-10-CM | POA: Diagnosis not present

## 2017-08-09 DIAGNOSIS — I701 Atherosclerosis of renal artery: Secondary | ICD-10-CM

## 2017-08-09 DIAGNOSIS — E7800 Pure hypercholesterolemia, unspecified: Secondary | ICD-10-CM | POA: Insufficient documentation

## 2017-08-09 HISTORY — PX: IR RENAL BILAT S&I MOD SED: IMG656

## 2017-08-09 HISTORY — PX: IR US GUIDE VASC ACCESS RIGHT: IMG2390

## 2017-08-09 HISTORY — PX: IR TRANSCATH PLC STENT 1ST ART NOT LE CV CAR VERT CAR: IMG5443

## 2017-08-09 LAB — BASIC METABOLIC PANEL
ANION GAP: 10 (ref 5–15)
BUN: 14 mg/dL (ref 6–20)
CALCIUM: 9.1 mg/dL (ref 8.9–10.3)
CO2: 26 mmol/L (ref 22–32)
Chloride: 102 mmol/L (ref 101–111)
Creatinine, Ser: 1.52 mg/dL — ABNORMAL HIGH (ref 0.61–1.24)
GFR, EST AFRICAN AMERICAN: 50 mL/min — AB (ref >60)
GFR, EST NON AFRICAN AMERICAN: 43 mL/min — AB (ref >60)
GLUCOSE: 91 mg/dL (ref 65–99)
Potassium: 3.7 mmol/L (ref 3.5–5.1)
SODIUM: 138 mmol/L (ref 135–145)

## 2017-08-09 LAB — CBC
HCT: 49.1 % (ref 39.0–52.0)
HEMOGLOBIN: 16.8 g/dL (ref 13.0–17.0)
MCH: 30.1 pg (ref 26.0–34.0)
MCHC: 34.2 g/dL (ref 30.0–36.0)
MCV: 88 fL (ref 78.0–100.0)
Platelets: 161 10*3/uL (ref 150–400)
RBC: 5.58 MIL/uL (ref 4.22–5.81)
RDW: 13.3 % (ref 11.5–15.5)
WBC: 4.8 10*3/uL (ref 4.0–10.5)

## 2017-08-09 LAB — PROTIME-INR
INR: 0.98
PROTHROMBIN TIME: 12.9 s (ref 11.4–15.2)

## 2017-08-09 MED ORDER — FENTANYL CITRATE (PF) 100 MCG/2ML IJ SOLN
INTRAMUSCULAR | Status: AC | PRN
  Administered 2017-08-09: 25 ug via INTRAVENOUS
  Administered 2017-08-09: 12.5 ug via INTRAVENOUS
  Administered 2017-08-09: 50 ug via INTRAVENOUS
  Administered 2017-08-09: 25 ug via INTRAVENOUS

## 2017-08-09 MED ORDER — IOPAMIDOL (ISOVUE-300) INJECTION 61%
INTRAVENOUS | Status: AC
  Administered 2017-08-09: 20 mL
  Filled 2017-08-09: qty 50

## 2017-08-09 MED ORDER — FENTANYL CITRATE (PF) 100 MCG/2ML IJ SOLN
INTRAMUSCULAR | Status: AC
  Filled 2017-08-09: qty 4

## 2017-08-09 MED ORDER — MIDAZOLAM HCL 2 MG/2ML IJ SOLN
INTRAMUSCULAR | Status: AC
  Filled 2017-08-09: qty 4

## 2017-08-09 MED ORDER — MIDAZOLAM HCL 2 MG/2ML IJ SOLN
INTRAMUSCULAR | Status: AC | PRN
  Administered 2017-08-09: 0.5 mg via INTRAVENOUS
  Administered 2017-08-09: 1 mg via INTRAVENOUS
  Administered 2017-08-09: 0.5 mg via INTRAVENOUS

## 2017-08-09 MED ORDER — LIDOCAINE HCL (PF) 1 % IJ SOLN
INTRAMUSCULAR | Status: AC | PRN
  Administered 2017-08-09: 10 mL

## 2017-08-09 MED ORDER — SODIUM CHLORIDE 0.9 % IV SOLN: INTRAVENOUS | Status: DC

## 2017-08-09 MED ORDER — HEPARIN SODIUM (PORCINE) 1000 UNIT/ML IJ SOLN
INTRAMUSCULAR | Status: AC
  Filled 2017-08-09: qty 1

## 2017-08-09 MED ORDER — IODIXANOL 320 MG/ML IV SOLN
100.0000 mL | Freq: Once | INTRAVENOUS | Status: AC | PRN
  Administered 2017-08-09: 18 mL via INTRA_ARTERIAL

## 2017-08-09 MED ORDER — HEPARIN SODIUM (PORCINE) 1000 UNIT/ML IJ SOLN
INTRAMUSCULAR | Status: AC | PRN
  Administered 2017-08-09: 1000 [IU] via INTRAVENOUS
  Administered 2017-08-09: 2000 [IU] via INTRAVENOUS

## 2017-08-09 MED ORDER — SODIUM CHLORIDE 0.9 % IV SOLN
INTRAVENOUS | Status: DC
  Administered 2017-08-09: 07:00:00 via INTRAVENOUS

## 2017-08-09 NOTE — Sedation Documentation (Signed)
Patient is resting comfortably. 

## 2017-08-09 NOTE — H&P (Signed)
Chief Complaint: Patient was seen in consultation today for Renal arteriogram with possible intervention at the request of Dr Carolynne Edouard  Supervising Physician: Aletta Edouard  Patient Status: [facility] - Out-pt  History of Present Illness: [name] is a 77 y.o. male   Hx CABG 2006 R Carotid endarterectomy 2007  Left Renal artery stenosis Hx CKD- followed by Dr [name] 05/2017 developed new high BP readings and presented to [facility] ED Renal artery [place]: Athereosclerosis of the abdominal aorta. Normal right renal artery. >60% left renal artery stenosis. 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 Normal and symmetrical kidney size. Normal cortical thickness, bilaterally. Patent renal veins and IVC. Incidental findings: >50% bilateral common iliac artery stenosis (velocities in low 300 range)  Scheduled now for Renal arteriogram with possible intervention  Dr [name] note 07/13/17: We discussed details of renal arteriography and intervascular stent placement including risks. Given chronic kidney disease, I would plan to bring [name] to the hospital several hours before a planned procedure in order to receive pre-procedural IV hydration. He would also receive hydration for several hours following the procedure should he receive any iodinated contrast.  Past Medical History:  Diagnosis Date  . Anemia, unspecified   . Basal cell carcinoma (BCC) of right forearm    [name]  . Benign paroxysmal positional vertigo   . Blood transfusion without reported diagnosis   . CAD (coronary artery disease)    HX CABG 2006 X 4; last nuc 11/2010 last cath 2012-wth 95% OM too small for intervention, treated medically   . Carcinoma in situ of skin, site unspecified   . Cerebrovascular disease, unspecified    hx CEA-RT; KNOWN AOTOILIAC 7 LT sfa disease  . Chronic renal insufficiency    glomerulonephritis 7564332.9  . Congenital anomaly of tongue, unspecified   . Esophageal reflux   .  Essential hypertension, benign   . Genital herpes, unspecified   . History of stress test 06/2010  . Hx of echocardiogram 01/2005   EF 55% normal study  . Lung mass   . PAF (paroxysmal atrial fibrillation) ([place])    post CABG, maintaining SR  . Peripheral vascular disease, unspecified ([place])   . Pure hypercholesterolemia     Past Surgical History:  Procedure Laterality Date  . CARDIAC CATHETERIZATION  06/2010  . CAROTID ENDARTERECTOMY  2007   Right  . CHOLECYSTECTOMY  07/2010  . CORONARY ARTERY BYPASS GRAFT  05/12/2005   LIMA-LAD, VG-DIAG; VG-LCX,OM; VG-RCA  . IR RADIOLOGIST EVAL & MGMT  07/13/2017    Allergies: Adhesive [tape] and Latex  Medications: Prior to Admission medications   Medication Sig Start Date End Date Taking? Authorizing Provider  aspirin EC 81 MG tablet Take 81 mg by mouth at bedtime.   Yes [provider]  Choline Fenofibrate (FENOFIBRIC ACID) 45 MG CPDR TAKE 1 CAPSULE (45MG ) BY MOUTH AT BEDTIME 06/09/17  Yes [name], MD  dimenhyDRINATE (DRAMAMINE) 50 MG tablet Take 50 mg by mouth every 8 (eight) hours as needed (vertigo).   Yes [provider]  esomeprazole (NEXIUM) 40 MG capsule Take 40 mg by mouth daily as needed.    Yes [provider]  ezetimibe (ZETIA) 10 MG tablet TAKE 1 TABLET (10 MG TOTAL) BY MOUTH DAILY. 06/09/17  Yes [name], MD  furosemide (LASIX) 20 MG tablet Take 10 mg by mouth daily.   Yes [provider]  hydrochlorothiazide (HYDRODIURIL) 25 MG tablet Take 1 tablet (25 mg total) by mouth daily. Patient taking  differently: Take 12.5 mg by mouth daily.  06/03/17  Yes [name], MD  metoprolol tartrate (LOPRESSOR) 25 MG tablet Take 12.5 mg by mouth 2 (two) times daily.   Yes [provider]  nitroGLYCERIN (NITROSTAT) 0.4 MG SL tablet Place 1 tablet (0.4 mg total) under the tongue every 5 (five) minutes as needed for chest pain. 03/16/17  Yes [name], MD  simvastatin  (ZOCOR) 80 MG tablet TAKE 1 TABLET (80 MG TOTAL) BY MOUTH DAILY AT 6 PM. 04/06/17  Yes [name], MD  acyclovir (ZOVIRAX) 400 MG tablet Take 1 tablet (400 mg total) by mouth 5 (five) times daily. Patient taking differently: Take 400 mg by mouth 2 (two) times daily as needed.  03/16/17   [name], MD     Family History  Problem Relation Age of Onset  . Diabetes Mother   . Cancer Brother 35       colon cancer    Social History   Socioeconomic History  . Marital status: Married    Spouse name: [name]  . Number of children: 0  . Years of education: None  . Highest education level: None  Social Needs  . Financial resource strain: None  . Food insecurity - worry: None  . Food insecurity - inability: None  . Transportation needs - medical: None  . Transportation needs - non-medical: None  Occupational History  . Occupation: Retired    Comment: Theme park manager: RETIRED  Tobacco Use  . Smoking status: Never Smoker  . Smokeless tobacco: Never Used  Substance and Sexual Activity  . Alcohol use: Yes    Alcohol/week: 0.0 oz    Comment: socially  . Drug use: No  . Sexual activity: Yes  Other Topics Concern  . None  Social History Narrative   Last name is pronounced "cas ka" No caffeine use. Always uses seat belts. Exercise:Moderate walks 30 min daily and rides a bike. Uses treadmill 20 mins everyday. Smoke alarm in home, No guns in the home.      Marital status:  Married x 12 years: happily married. Most of family in [place].      Children; none      Lives: with wife      Employment: retired.      Tobacco; never       Alcohol: wine after lunch daily.      Exercise:  Treadmill 20 minutes per day seven days per week.  Also walking throughout the day.      Advanced Directives: YES: FULL CODE no prolonged measures.      ADLs:  Independent with ADLs; no assistant devices.  Drives.     Review of Systems: A 12 point ROS discussed and pertinent positives are  indicated in the HPI above.  All other systems are negative.  Review of Systems  Constitutional: Negative for activity change, fatigue and fever.  Respiratory: Negative for cough and shortness of breath.   Cardiovascular: Negative for chest pain.  Gastrointestinal: Negative for abdominal pain.  Musculoskeletal: Negative for back pain.  Neurological: Negative for weakness.  Psychiatric/Behavioral: Negative for behavioral problems and confusion.    Vital Signs: BP (!) 145/51 (BP Location: Right Arm)   Pulse 66   Temp (!) 97.5 F (36.4 C)   Ht 5\' 9"  (1.753 m)   Wt 160 lb (72.6 kg)   SpO2 100%   BMI 23.63 kg/m   Physical Exam  Constitutional: He is oriented to  person, place, and time. He appears well-nourished.  Cardiovascular: Normal rate, regular rhythm and normal heart sounds.  Pulmonary/Chest: Effort normal and breath sounds normal.  Abdominal: Soft. Bowel sounds are normal.  Musculoskeletal: Normal range of motion.  Neurological: He is alert and oriented to person, place, and time.  Skin: Skin is warm and dry.  Psychiatric: He has a normal mood and affect. His behavior is normal. Judgment and thought content normal.  Nursing note and vitals reviewed.   Imaging: Ir Radiologist Eval & Mgmt  Result Date: 07/13/2017 Please refer to notes tab for details about interventional procedure. (Op Note)   Labs:  CBC: Recent Labs    03/16/17 1443 05/18/17 1147 05/19/17 1647 08/09/17 0658  WBC 4.3 4.9 5.1 4.8  HGB 16.5 17.0 16.8 16.8  HCT 50.8 49.3 48.4 49.1  PLT 151 181 175 161    COAGS: Recent Labs    08/09/17 0658  INR 0.98    BMP: Recent Labs    03/16/17 1443 05/18/17 1147 05/19/17 1647 08/09/17 0658  NA 141 138 134* 138  K 4.7 5.0 4.4 3.7  CL 102 98 96* 102  CO2 23 24 29 26   GLUCOSE 79 81 98 91  BUN 13 14 14 14   CALCIUM 9.5 9.5 9.1 9.1  CREATININE 1.43* 1.42* 1.66* 1.52*  GFRNONAA 48* 48* 39* 43*  GFRAA 55* 55* 45* 50*    LIVER FUNCTION  TESTS: Recent Labs    03/16/17 1443 05/18/17 1147 05/19/17 1647  BILITOT 0.5 0.3 0.4  AST 25 27 31   ALT 19 19 23   ALKPHOS 72 69 65  PROT 6.8 6.8 6.2*  ALBUMIN 4.8 4.6 4.2    TUMOR MARKERS: No results for input(s): AFPTM, CEA, CA199, CHROMGRNA in the last 8760 hours.  Assessment and Plan:  Left renal artery stenosis Scheduled for renal arteriogram with possible intervention  Risks and benefits of renal arteriogram were discussed with the patient including, but not limited to bleeding, infection, vascular injury or contrast induced renal failure. This interventional procedure involves the use of X-rays and because of the nature of the planned procedure, it is possible that we will have prolonged use of X-ray fluoroscopy. Potential radiation risks to you include (but are not limited to) the following: - A slightly elevated risk for cancer  several years later in life. This risk is typically less than 0.5% percent. This risk is low in comparison to the normal incidence of human cancer, which is 33% for women and 50% for men according to the [place]. - Radiation induced injury can include skin redness, resembling a rash, tissue breakdown / ulcers and hair loss (which can be temporary or permanent).  The likelihood of either of these occurring depends on the difficulty of the procedure and whether you are sensitive to radiation due to previous procedures, disease, or genetic conditions.  IF your procedure requires a prolonged use of radiation, you will be notified and given written instructions for further action.  It is your responsibility to monitor the irradiated area for the 2 weeks following the procedure and to notify your physician if you are concerned that you have suffered a radiation induced injury.    All of the patient's questions were answered, patient is agreeable to proceed. Consent signed and in chart.  Thank you for this interesting consult.  I greatly  enjoyed meeting [name] and look forward to participating in their care.  A copy of this report was sent to the  requesting provider on this date.  Electronically Signed: Lavonia Drafts, PA-C 08/09/2017, 9:00 AM   I spent a total of  30 Minutes   in face to face in clinical consultation, greater than 50% of which was counseling/coordinating care for renal arteriogram

## 2017-08-09 NOTE — Procedures (Signed)
Interventional Radiology Procedure Note  Procedure: Bilateral renal arteriography and left renal artery stenting  Complications: None  Estimated Blood Loss: < 10 mL  Findings: Significant 75-85% proximal left RAS with significant pressure gradient.  Stented with 6 x 15 mm Palmaz Blue stent. Roughly 40-50% proximal right RAS without significant pressure gradient.    See full dictated procedural note.  Venetia Night. [name], M.D Pager:  [phone]

## 2017-08-13 ENCOUNTER — Ambulatory Visit (INDEPENDENT_AMBULATORY_CARE_PROVIDER_SITE_OTHER): Payer: Medicare Other | Admitting: Physician Assistant

## 2017-08-13 ENCOUNTER — Encounter: Payer: Self-pay | Admitting: Physician Assistant

## 2017-08-13 ENCOUNTER — Other Ambulatory Visit: Payer: Self-pay

## 2017-08-13 VITALS — BP 160/70 | HR 75 | Temp 98.1°F | Resp 18 | Ht 69.0 in | Wt 161.6 lb

## 2017-08-13 DIAGNOSIS — H9312 Tinnitus, left ear: Secondary | ICD-10-CM | POA: Diagnosis not present

## 2017-08-13 DIAGNOSIS — I701 Atherosclerosis of renal artery: Secondary | ICD-10-CM | POA: Diagnosis not present

## 2017-08-13 NOTE — Patient Instructions (Addendum)
Take 25 mg of HCTZ.  If you develop weakness please go the the hospital. Purchase a sound machine.  Call the ENT doctor to have him look at your ear.     IF you received an x-ray today, you will receive an invoice from [facility]. Please contact [facility] at [phone] with questions or concerns regarding your invoice.   IF you received labwork today, you will receive an invoice from [facility]. Please contact [facility] at [phone] with questions or concerns regarding your invoice.   Our billing staff will not be able to assist you with questions regarding bills from these companies.  You will be contacted with the lab results as soon as they are available. The fastest way to get your results is to activate your My Chart account. Instructions are located on the last page of this paperwork. If you have not heard from [place] regarding the results in 2 weeks, please contact this office.

## 2017-08-13 NOTE — Progress Notes (Signed)
08/15/2017 8:46 AM   DOB: Feb 19, 1941 / MRN: [id number]  SUBJECTIVE:  [name] is a 77 y.o. male presenting for left nonpulsatile ocean-like tinnitus.  He has had this in the past and the symptoms tend to wax and wane.  He tells me that over the last 24 hours it has been particularly bad.  He denies dizziness, weakness in the extremities, slurred speech.  He thinks his left ear is clogged and would like it cleaned out today.    He is allergic to adhesive [tape] and latex.   He  has a past medical history of Anemia, unspecified, Basal cell carcinoma (BCC) of right forearm, Benign paroxysmal positional vertigo, Blood transfusion without reported diagnosis, CAD (coronary artery disease), Carcinoma in situ of skin, site unspecified, Cerebrovascular disease, unspecified, Chronic renal insufficiency, Congenital anomaly of tongue, unspecified, Esophageal reflux, Essential hypertension, benign, Genital herpes, unspecified, History of stress test (06/2010), echocardiogram (01/2005), Lung mass, PAF (paroxysmal atrial fibrillation) ([place]), Peripheral vascular disease, unspecified ([place]), and Pure hypercholesterolemia.    He  reports that  has never smoked. he has never used smokeless tobacco. He reports that he drinks alcohol. He reports that he does not use drugs. He  reports that he currently engages in sexual activity. The patient  has a past surgical history that includes Coronary artery bypass graft (05/12/2005); Carotid endarterectomy (2007); Cardiac catheterization (06/2010); Cholecystectomy (07/2010); IR Radiologist Eval & Mgmt (07/13/2017); IR Angiogram Renal Bilateral Selective (08/09/2017); IR TRANSCATH PLC STENT 1ST ART NOT LE CV CAR VERT CAR (08/09/2017); and IR US Guide Vasc Access Right (08/09/2017).  His family history includes Cancer (age of onset: 32) in his brother; Diabetes in his mother.  Review of Systems  Constitutional: Negative for chills, diaphoresis and fever.  HENT: Positive  for tinnitus. Negative for congestion, ear discharge, ear pain and hearing loss.   Eyes: Negative.   Respiratory: Negative for cough, hemoptysis, sputum production, shortness of breath and wheezing.   Cardiovascular: Negative for chest pain, orthopnea and leg swelling.  Gastrointestinal: Negative for nausea.  Skin: Negative for rash.  Neurological: Negative for dizziness, sensory change, speech change, focal weakness and headaches.    The problem list and medications were reviewed and updated by myself where necessary and exist elsewhere in the encounter.   OBJECTIVE:  BP (!) 160/70   Pulse 75   Temp 98.1 F (36.7 C) (Oral)   Resp 18   Ht 5\' 9"  (1.753 m)   Wt 161 lb 9.6 oz (73.3 kg)   SpO2 97%   BMI 23.86 kg/m   BP Readings from Last 3 Encounters:  08/13/17 (!) 160/70  08/09/17 (!) 121/54  07/13/17 138/65   Lab Results  Component Value Date   CREATININE 1.52 (H) 08/09/2017   BUN 14 08/09/2017   NA 138 08/09/2017   K 3.7 08/09/2017   CL 102 08/09/2017   CO2 26 08/09/2017   Lab Results  Component Value Date   CREATININE 1.52 (H) 08/09/2017   CREATININE 1.66 (H) 05/19/2017   CREATININE 1.42 (H) 05/18/2017     Physical Exam  Constitutional: He appears well-developed. He is active and cooperative.  Non-toxic appearance.  HENT:  Right Ear: Hearing, tympanic membrane, external ear and ear canal normal.  Left Ear: Hearing, tympanic membrane, external ear and ear canal normal.  Ears:  Nose: Nose normal. Right sinus exhibits no maxillary sinus tenderness and no frontal sinus tenderness. Left sinus exhibits no maxillary sinus tenderness and no frontal sinus tenderness.  Mouth/Throat: Uvula is midline, oropharynx is clear and moist and mucous membranes are normal. No oropharyngeal exudate, posterior oropharyngeal edema or tonsillar abscesses.  Eyes: Conjunctivae are normal. Pupils are equal, round, and reactive to light.  Cardiovascular: Normal rate, regular rhythm, S1  normal, S2 normal, normal heart sounds, intact distal pulses and normal pulses. Exam reveals no gallop and no friction rub.  No murmur heard. Carotid pulses 2+ bilaterally and negative for bruit.  Pulmonary/Chest: Effort normal. No stridor. No tachypnea. No respiratory distress. He has no wheezes. He has no rales.  Abdominal: He exhibits no distension.  Musculoskeletal: He exhibits no edema.  Lymphadenopathy:       Head (right side): No submandibular and no tonsillar adenopathy present.       Head (left side): No submandibular and no tonsillar adenopathy present.    He has no cervical adenopathy.  Negative for lymphadenopathy about the head and neck  Neurological: He is alert.  Skin: Skin is warm and dry. He is not diaphoretic. No pallor.  Vitals reviewed.   No results found for this or any previous visit (from the past 72 hour(s)).  No results found.  ASSESSMENT AND PLAN:  Rayce was seen today for ear pain.  Diagnoses and all orders for this visit:  Tinnitus aurium, left: Nonpulsatile tinnitus times 1 day.  I have advised noise cancellation.  The patient is not dizzy and has no neurological red flags today in HPI or exam.  I have advised that he discuss the symptoms with his ENT as he has in the past.  Patient is prescribed 25 mg of hydrochlorothiazide however has only been taking 12.5.  Given his elevated pressure in the office have advised that he go up to the full dose.  He can follow-up with Dr. Tamala Julian, his PCP or his cardiologist.  Patient is reliable and checks his blood pressure at least daily.  I trust that if he has any problems she will come back to the clinic sooner    The patient is advised to call or return to clinic if he does not see an improvement in symptoms, or to seek the care of the closest emergency department if he worsens with the above plan.   Philis Fendt, MHS, PA-C [facility] 08/15/2017 8:46 AM

## 2017-08-16 ENCOUNTER — Other Ambulatory Visit (HOSPITAL_COMMUNITY): Payer: Self-pay | Admitting: Interventional Radiology

## 2017-08-16 DIAGNOSIS — I701 Atherosclerosis of renal artery: Secondary | ICD-10-CM

## 2017-08-30 DIAGNOSIS — H9319 Tinnitus, unspecified ear: Secondary | ICD-10-CM | POA: Diagnosis not present

## 2017-08-30 DIAGNOSIS — H903 Sensorineural hearing loss, bilateral: Secondary | ICD-10-CM | POA: Diagnosis not present

## 2017-09-07 DIAGNOSIS — N183 Chronic kidney disease, stage 3 (moderate): Secondary | ICD-10-CM | POA: Diagnosis not present

## 2017-09-07 DIAGNOSIS — D631 Anemia in chronic kidney disease: Secondary | ICD-10-CM | POA: Diagnosis not present

## 2017-09-07 DIAGNOSIS — E785 Hyperlipidemia, unspecified: Secondary | ICD-10-CM | POA: Diagnosis not present

## 2017-09-20 ENCOUNTER — Ambulatory Visit
Admission: RE | Admit: 2017-09-20 | Discharge: 2017-09-20 | Disposition: A | Discharge status: Home / Self Care | Payer: Medicare Other | Source: Ambulatory Visit | Attending: Interventional Radiology | Admitting: Interventional Radiology

## 2017-09-20 DIAGNOSIS — I7090 Unspecified atherosclerosis: Secondary | ICD-10-CM | POA: Diagnosis not present

## 2017-09-20 DIAGNOSIS — E785 Hyperlipidemia, unspecified: Secondary | ICD-10-CM | POA: Diagnosis not present

## 2017-09-20 DIAGNOSIS — N057 Unspecified nephritic syndrome with diffuse crescentic glomerulonephritis: Secondary | ICD-10-CM | POA: Diagnosis not present

## 2017-09-20 DIAGNOSIS — N058 Unspecified nephritic syndrome with other morphologic changes: Secondary | ICD-10-CM | POA: Diagnosis not present

## 2017-09-20 DIAGNOSIS — I129 Hypertensive chronic kidney disease with stage 1 through stage 4 chronic kidney disease, or unspecified chronic kidney disease: Secondary | ICD-10-CM | POA: Diagnosis not present

## 2017-09-20 DIAGNOSIS — N183 Chronic kidney disease, stage 3 (moderate): Secondary | ICD-10-CM | POA: Diagnosis not present

## 2017-09-20 DIAGNOSIS — I701 Atherosclerosis of renal artery: Secondary | ICD-10-CM | POA: Diagnosis not present

## 2017-09-20 DIAGNOSIS — I739 Peripheral vascular disease, unspecified: Secondary | ICD-10-CM | POA: Diagnosis not present

## 2017-09-20 DIAGNOSIS — Z9889 Other specified postprocedural states: Secondary | ICD-10-CM | POA: Diagnosis not present

## 2017-09-20 DIAGNOSIS — D751 Secondary polycythemia: Secondary | ICD-10-CM | POA: Diagnosis not present

## 2017-09-20 DIAGNOSIS — J019 Acute sinusitis, unspecified: Secondary | ICD-10-CM | POA: Diagnosis not present

## 2017-09-20 HISTORY — PX: IR RADIOLOGIST EVAL & MGMT: IMG5224

## 2017-09-20 NOTE — Progress Notes (Signed)
Chief Complaint: Follow up renal angioplasty/stent  Referring Physician(s): [place]  Supervising Physician: Aletta Edouard  History of Present Illness: [name] is a 77 y.o. male with CKD and hypertension.  Renal arteriogram was done by Dr. 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 Measured pressure gradient was significant and the stenosis was treated with placement a 6 mm x 15 mm balloon expandable stent. Angiographic result demonstrates normal patency of the stented segment with no residual stenosis remaining.  He is here today for follow up.  He has kept excellent track of his blood pressures.  He held his hydrochlorothiazide when his SBP fell below 100.  He did not stop his Metoprolol at all.  He has some rhinorrhea and seasonal nasal symptoms but other than that he feels good.  Past Medical History:  Diagnosis Date  . Anemia, unspecified   . Basal cell carcinoma (BCC) of right forearm    [name]  . Benign paroxysmal positional vertigo   . Blood transfusion without reported diagnosis   . CAD (coronary artery disease)    HX CABG 2006 X 4; last nuc 11/2010 last cath 2012-wth 95% OM too small for intervention, treated medically   . Carcinoma in situ of skin, site unspecified   . Cerebrovascular disease, unspecified    hx CEA-RT; KNOWN AOTOILIAC 7 LT sfa disease  . Chronic renal insufficiency    glomerulonephritis 4196222.9  . Congenital anomaly of tongue, unspecified   . Esophageal reflux   . Essential hypertension, benign   . Genital herpes, unspecified   . History of stress test 06/2010  . Hx of echocardiogram 01/2005   EF 55% normal study  . Lung mass   . PAF (paroxysmal atrial fibrillation) ([place])    post CABG, maintaining SR  . Peripheral vascular disease, unspecified ([place])   .  Pure hypercholesterolemia     Past Surgical History:  Procedure Laterality Date  . CARDIAC CATHETERIZATION  06/2010  . CAROTID ENDARTERECTOMY  2007   Right  . CHOLECYSTECTOMY  07/2010  . CORONARY ARTERY BYPASS GRAFT  05/12/2005   LIMA-LAD, VG-DIAG; VG-LCX,OM; VG-RCA  . IR RADIOLOGIST EVAL & MGMT  07/13/2017  . IR RENAL BILAT S&I MOD SED  08/09/2017  . IR TRANSCATH PLC STENT 1ST ART NOT LE CV CAR VERT CAR  08/09/2017  . IR US GUIDE VASC ACCESS RIGHT  08/09/2017    Allergies: Adhesive [tape] and Latex  Medications: Prior to Admission medications   Medication Sig Start Date End Date Taking? Authorizing Provider  acyclovir (ZOVIRAX) 400 MG tablet Take 1 tablet (400 mg total) by mouth 5 (five) times daily. Patient taking differently: Take 400 mg by mouth 2 (two) times daily as needed.  03/16/17   [name], MD  aspirin EC 81 MG tablet Take 81 mg by mouth at bedtime.    [provider]  Choline Fenofibrate (FENOFIBRIC ACID) 45 MG CPDR TAKE 1 CAPSULE (45MG ) BY MOUTH AT BEDTIME 06/09/17   [name], MD  dimenhyDRINATE (DRAMAMINE) 50 MG tablet Take 50 mg by mouth every 8 (eight) hours as needed (vertigo).    [provider]  esomeprazole (NEXIUM) 40 MG capsule Take 40 mg by mouth daily as needed.     [provider]  ezetimibe (ZETIA) 10 MG tablet TAKE 1 TABLET (10 MG TOTAL) BY MOUTH DAILY. 06/09/17  [name], MD  furosemide (LASIX) 20 MG tablet Take 10 mg by mouth daily.    [provider]  hydrochlorothiazide (HYDRODIURIL) 25 MG tablet Take 1 tablet (25 mg total) by mouth daily. Patient taking differently: Take 12.5 mg by mouth daily.  06/03/17   [name], MD  metoprolol tartrate (LOPRESSOR) 25 MG tablet Take 12.5 mg by mouth 2 (two) times daily.    [provider]  nitroGLYCERIN (NITROSTAT) 0.4 MG SL tablet Place 1 tablet (0.4 mg total) under the tongue every 5 (five) minutes as needed for chest pain. 03/16/17    [name], MD  simvastatin (ZOCOR) 80 MG tablet TAKE 1 TABLET (80 MG TOTAL) BY MOUTH DAILY AT 6 PM. 04/06/17   [name], MD     Family History  Problem Relation Age of Onset  . Diabetes Mother   . Cancer Brother 58       colon cancer    Social History   Socioeconomic History  . Marital status: Married    Spouse name: [name]  . Number of children: 0  . Years of education: Not on file  . Highest education level: Not on file  Occupational History  . Occupation: Retired    Comment: Theme park manager: RETIRED  Social Needs  . Financial resource strain: Not on file  . Food insecurity:    Worry: Not on file    Inability: Not on file  . Transportation needs:    Medical: Not on file    Non-medical: Not on file  Tobacco Use  . Smoking status: Never Smoker  . Smokeless tobacco: Never Used  Substance and Sexual Activity  . Alcohol use: Yes    Alcohol/week: 0.0 oz    Comment: socially  . Drug use: No  . Sexual activity: Yes  Lifestyle  . Physical activity:    Days per week: Not on file    Minutes per session: Not on file  . Stress: Not on file  Relationships  . Social connections:    Talks on phone: Not on file    Gets together: Not on file    Attends religious service: Not on file    Active member of club or organization: Not on file    Attends meetings of clubs or organizations: Not on file    Relationship status: Not on file  Other Topics Concern  . Not on file  Social History Narrative   Last name is pronounced "cas ka" No caffeine use. Always uses seat belts. Exercise:Moderate walks 30 min daily and rides a bike. Uses treadmill 20 mins everyday. Smoke alarm in home, No guns in the home.      Marital status:  Married x 12 years: happily married. Most of family in [place].      Children; none      Lives: with wife      Employment: retired.      Tobacco; never       Alcohol: wine after lunch daily.      Exercise:  Treadmill 20 minutes per day  seven days per week.  Also walking throughout the day.      Advanced Directives: YES: FULL CODE no prolonged measures.      ADLs:  Independent with ADLs; no assistant devices.  Drives.    Review of Systems: A 12 point ROS discussed and pertinent positives are indicated in the HPI above.  All other systems are negative. Review of Systems  Vital Signs: BP 126/69 (BP Location: Left Arm, Patient Position: Sitting, Cuff Size: Normal)   Pulse 72   Resp 20   SpO2 100%   Physical Exam  Constitutional: He is oriented to person, place, and time. He appears well-developed.  HENT:  Head: Normocephalic.  Eyes: EOM are normal.  Neck: Normal range of motion.  Cardiovascular: Normal rate and regular rhythm.  Pulmonary/Chest: Effort normal. No respiratory distress.  Musculoskeletal: Normal range of motion.  Neurological: He is alert and oriented to person, place, and time.  Skin: Skin is warm and dry.  Psychiatric: He has a normal mood and affect. His behavior is normal. Judgment and thought content normal.  Vitals reviewed. Right groin stick site completely healed.   Imaging: No results found.  Labs:  CBC: Recent Labs    03/16/17 1443 05/18/17 1147 05/19/17 1647 08/09/17 0658  WBC 4.3 4.9 5.1 4.8  HGB 16.5 17.0 16.8 16.8  HCT 50.8 49.3 48.4 49.1  PLT 151 181 175 161    COAGS: Recent Labs    08/09/17 0658  INR 0.98    BMP: Recent Labs    03/16/17 1443 05/18/17 1147 05/19/17 1647 08/09/17 0658  NA 141 138 134* 138  K 4.7 5.0 4.4 3.7  CL 102 98 96* 102  CO2 23 24 29 26   GLUCOSE 79 81 98 91  BUN 13 14 14 14   CALCIUM 9.5 9.5 9.1 9.1  CREATININE 1.43* 1.42* 1.66* 1.52*  GFRNONAA 48* 48* 39* 43*  GFRAA 55* 55* 45* 50*    LIVER FUNCTION TESTS: Recent Labs    03/16/17 1443 05/18/17 1147 05/19/17 1647  BILITOT 0.5 0.3 0.4  AST 25 27 31   ALT 19 19 23   ALKPHOS 72 69 65  PROT 6.8 6.8 6.2*  ALBUMIN 4.8 4.6 4.2    TUMOR MARKERS: No results for input(s): AFPTM,  CEA, CA199, CHROMGRNA in the last 8760 hours.  Assessment:  CKD and Hypertension  S/P left renal artery angiography and balloon angioplasty by Dr. [name] on 08/09/2017.  Doing well.  Dr. Kathe Mariner will follow his creatinine and BP and should return here for duplex of renal arteries if re-stenosis is suspected.  Electronically Signed: Murrell Redden PA-C 09/20/2017, 1:17 PM   Please refer to Dr. Margaretmary Dys attestation of this note for management and plan.

## 2017-10-09 ENCOUNTER — Other Ambulatory Visit: Payer: Self-pay | Admitting: Cardiovascular Disease

## 2017-10-10 NOTE — Telephone Encounter (Signed)
Rx sent to pharmacy   

## 2017-10-12 DIAGNOSIS — H2513 Age-related nuclear cataract, bilateral: Secondary | ICD-10-CM | POA: Diagnosis not present

## 2017-11-09 ENCOUNTER — Encounter: Payer: Self-pay | Admitting: Family Medicine

## 2017-12-05 DIAGNOSIS — E785 Hyperlipidemia, unspecified: Secondary | ICD-10-CM | POA: Diagnosis not present

## 2017-12-05 DIAGNOSIS — N189 Chronic kidney disease, unspecified: Secondary | ICD-10-CM | POA: Diagnosis not present

## 2017-12-05 DIAGNOSIS — N183 Chronic kidney disease, stage 3 (moderate): Secondary | ICD-10-CM | POA: Diagnosis not present

## 2018-02-10 DIAGNOSIS — Z85828 Personal history of other malignant neoplasm of skin: Secondary | ICD-10-CM | POA: Diagnosis not present

## 2018-02-10 DIAGNOSIS — L821 Other seborrheic keratosis: Secondary | ICD-10-CM | POA: Diagnosis not present

## 2018-02-10 DIAGNOSIS — L570 Actinic keratosis: Secondary | ICD-10-CM | POA: Diagnosis not present

## 2018-02-10 DIAGNOSIS — L820 Inflamed seborrheic keratosis: Secondary | ICD-10-CM | POA: Diagnosis not present

## 2018-02-10 DIAGNOSIS — D1801 Hemangioma of skin and subcutaneous tissue: Secondary | ICD-10-CM | POA: Diagnosis not present

## 2018-02-15 DIAGNOSIS — N189 Chronic kidney disease, unspecified: Secondary | ICD-10-CM | POA: Diagnosis not present

## 2018-02-15 DIAGNOSIS — N183 Chronic kidney disease, stage 3 (moderate): Secondary | ICD-10-CM | POA: Diagnosis not present

## 2018-02-15 DIAGNOSIS — E785 Hyperlipidemia, unspecified: Secondary | ICD-10-CM | POA: Diagnosis not present

## 2018-02-22 DIAGNOSIS — I7090 Unspecified atherosclerosis: Secondary | ICD-10-CM | POA: Diagnosis not present

## 2018-02-22 DIAGNOSIS — I129 Hypertensive chronic kidney disease with stage 1 through stage 4 chronic kidney disease, or unspecified chronic kidney disease: Secondary | ICD-10-CM | POA: Diagnosis not present

## 2018-02-22 DIAGNOSIS — I701 Atherosclerosis of renal artery: Secondary | ICD-10-CM | POA: Diagnosis not present

## 2018-02-22 DIAGNOSIS — N058 Unspecified nephritic syndrome with other morphologic changes: Secondary | ICD-10-CM | POA: Diagnosis not present

## 2018-02-22 DIAGNOSIS — N057 Unspecified nephritic syndrome with diffuse crescentic glomerulonephritis: Secondary | ICD-10-CM | POA: Diagnosis not present

## 2018-02-22 DIAGNOSIS — D751 Secondary polycythemia: Secondary | ICD-10-CM | POA: Diagnosis not present

## 2018-02-22 DIAGNOSIS — I739 Peripheral vascular disease, unspecified: Secondary | ICD-10-CM | POA: Diagnosis not present

## 2018-02-22 DIAGNOSIS — E785 Hyperlipidemia, unspecified: Secondary | ICD-10-CM | POA: Diagnosis not present

## 2018-02-22 DIAGNOSIS — N183 Chronic kidney disease, stage 3 (moderate): Secondary | ICD-10-CM | POA: Diagnosis not present

## 2018-03-21 ENCOUNTER — Encounter: Payer: Self-pay | Admitting: Family Medicine

## 2018-03-21 ENCOUNTER — Ambulatory Visit (INDEPENDENT_AMBULATORY_CARE_PROVIDER_SITE_OTHER): Payer: Medicare Other | Admitting: Family Medicine

## 2018-03-21 VITALS — BP 142/66 | HR 65 | Temp 98.2°F | Resp 16 | Ht 67.0 in | Wt 163.8 lb

## 2018-03-21 DIAGNOSIS — J069 Acute upper respiratory infection, unspecified: Secondary | ICD-10-CM

## 2018-03-21 DIAGNOSIS — I701 Atherosclerosis of renal artery: Secondary | ICD-10-CM

## 2018-03-21 DIAGNOSIS — R259 Unspecified abnormal involuntary movements: Secondary | ICD-10-CM

## 2018-03-21 DIAGNOSIS — J301 Allergic rhinitis due to pollen: Secondary | ICD-10-CM

## 2018-03-21 DIAGNOSIS — G252 Other specified forms of tremor: Secondary | ICD-10-CM | POA: Insufficient documentation

## 2018-03-21 MED ORDER — FLUTICASONE PROPIONATE 50 MCG/ACT NA SUSP: 2.0000 | Freq: Every day | NASAL | 6 refills | Status: DC

## 2018-03-21 NOTE — Patient Instructions (Addendum)
   If you have lab work done today you will be contacted with your lab results within the next 2 weeks.  If you have not heard from us then please contact us. The fastest way to get your results is to register for My Chart.   IF you received an x-ray today, you will receive an invoice from [facility]. Please contact [facility] at [phone] with questions or concerns regarding your invoice.   IF you received labwork today, you will receive an invoice from [facility]. Please contact [facility] at [phone] with questions or concerns regarding your invoice.   Our billing staff will not be able to assist you with questions regarding bills from these companies.  You will be contacted with the lab results as soon as they are available. The fastest way to get your results is to activate your My Chart account. Instructions are located on the last page of this paperwork. If you have not heard from us regarding the results in 2 weeks, please contact this office.     Allergic Rhinitis, Adult Allergic rhinitis is an allergic reaction that affects the mucous membrane inside the nose. It causes sneezing, a runny or stuffy nose, and the feeling of mucus going down the back of the throat (postnasal drip). Allergic rhinitis can be mild to severe. There are two types of allergic rhinitis:  Seasonal. This type is also called hay fever. It happens only during certain seasons.  Perennial. This type can happen at any time of the year.  What are the causes? This condition happens when the body's defense system (immune system) responds to certain harmless substances called allergens as though they were germs.  Seasonal allergic rhinitis is triggered by pollen, which can come from grasses, trees, and weeds. Perennial allergic rhinitis may be caused by:  House dust mites.  Pet dander.  Mold spores.  What are the signs or symptoms? Symptoms of this condition  include:  Sneezing.  Runny or stuffy nose (nasal congestion).  Postnasal drip.  Itchy nose.  Tearing of the eyes.  Trouble sleeping.  Daytime sleepiness.  How is this diagnosed? This condition may be diagnosed based on:  Your medical history.  A physical exam.  Tests to check for related conditions, such as: ? Asthma. ? Pink eye. ? Ear infection. ? Upper respiratory infection.  Tests to find out which allergens trigger your symptoms. These may include skin or blood tests.  How is this treated? There is no cure for this condition, but treatment can help control symptoms. Treatment may include:  Taking medicines that block allergy symptoms, such as antihistamines. Medicine may be given as a shot, nasal spray, or pill.  Avoiding the allergen.  Desensitization. This treatment involves getting ongoing shots until your body becomes less sensitive to the allergen. This treatment may be done if other treatments do not help.  If taking medicine and avoiding the allergen does not work, new, stronger medicines may be prescribed.  Follow these instructions at home:  Find out what you are allergic to. Common allergens include smoke, dust, and pollen.  Avoid the things you are allergic to. These are some things you can do to help avoid allergens: ? Replace carpet with wood, tile, or vinyl flooring. Carpet can trap dander and dust. ? Do not smoke. Do not allow smoking in your home. ? Change your heating and air conditioning filter at least once a month. ? During allergy season:  Keep windows closed as much as   possible.  Plan outdoor activities when pollen counts are lowest. This is usually during the evening hours.  When coming indoors, change clothing and shower before sitting on furniture or bedding.  Take over-the-counter and prescription medicines only as told by your health care provider.  Keep all follow-up visits as told by your health care provider. This is  important. Contact a health care provider if:  You have a fever.  You develop a persistent cough.  You make whistling sounds when you breathe (you wheeze).  Your symptoms interfere with your normal daily activities. Get help right away if:  You have shortness of breath. Summary  This condition can be managed by taking medicines as directed and avoiding allergens.  Contact your health care provider if you develop a persistent cough or fever.  During allergy season, keep windows closed as much as possible. This information is not intended to replace advice given to you by your health care provider. Make sure you discuss any questions you have with your health care provider. Document Released: 02/23/2001 Document Revised: 07/08/2016 Document Reviewed: 07/08/2016 Elsevier Interactive Patient Education  2018 [employer]  

## 2018-03-21 NOTE — Progress Notes (Signed)
Chief Complaint  Patient presents with  . runny nose    "clear, very occasional dry blood", I've got a sore in right side of nose, feels like a pimple.  all symptoms x 1 week  . Headache    "minor", lack of energy   . Cough    HPI  Patient had a week long history of runny nose, nonproductive cough that has improved He used aspirin and sucrets He had one episode of diarrhea Now he feels very tired He reports low energy and slight headache He states that he normally walks 30 minutes a day but has not been able to He is primarily here for the fatigue, low energy and cough  When asked about his resting tremor he states that he does not notice it but his wife notices   Past Medical History:  Diagnosis Date  . Anemia, unspecified   . Basal cell carcinoma (BCC) of right forearm    [name]  . Benign paroxysmal positional vertigo   . Blood transfusion without reported diagnosis   . CAD (coronary artery disease)    HX CABG 2006 X 4; last nuc 11/2010 last cath 2012-wth 95% OM too small for intervention, treated medically   . Carcinoma in situ of skin, site unspecified   . Cerebrovascular disease, unspecified    hx CEA-RT; KNOWN AOTOILIAC 7 LT sfa disease  . Chronic renal insufficiency    glomerulonephritis 5056979.4  . Congenital anomaly of tongue, unspecified   . Esophageal reflux   . Essential hypertension, benign   . Genital herpes, unspecified   . History of stress test 06/2010  . Hx of echocardiogram 01/2005   EF 55% normal study  . Lung mass   . PAF (paroxysmal atrial fibrillation) ([place])    post CABG, maintaining SR  . Peripheral vascular disease, unspecified ([place])   . Pure hypercholesterolemia     Current Outpatient Medications  Medication Sig Dispense Refill  . acyclovir (ZOVIRAX) 400 MG tablet Take 1 tablet (400 mg total) by mouth 5 (five) times daily. 30 tablet 2  . aspirin 325 MG tablet Take 325 mg by mouth daily.    [name] aspirin EC 81 MG tablet Take 81 mg by  mouth at bedtime.    . Choline Fenofibrate (FENOFIBRIC ACID) 45 MG CPDR TAKE 1 CAPSULE (45MG ) BY MOUTH AT BEDTIME 30 capsule 11  . dimenhyDRINATE (DRAMAMINE) 50 MG tablet Take 50 mg by mouth every 8 (eight) hours as needed (vertigo).    [name] esomeprazole (NEXIUM) 40 MG capsule Take 40 mg by mouth daily as needed.     . ezetimibe (ZETIA) 10 MG tablet TAKE 1 TABLET (10 MG TOTAL) BY MOUTH DAILY. 30 tablet 10  . furosemide (LASIX) 20 MG tablet Take 10 mg by mouth daily.    . metoprolol tartrate (LOPRESSOR) 25 MG tablet Take 12.5 mg by mouth 2 (two) times daily.    . nitroGLYCERIN (NITROSTAT) 0.4 MG SL tablet Place 1 tablet (0.4 mg total) under the tongue every 5 (five) minutes as needed for chest pain. 25 tablet 3  . simvastatin (ZOCOR) 80 MG tablet TAKE 1 TABLET (80 MG TOTAL) BY MOUTH DAILY AT 6 PM. 90 tablet 1  . fluticasone (FLONASE) 50 MCG/ACT nasal spray Place 2 sprays into both nostrils daily. 16 g 6  . hydrochlorothiazide (HYDRODIURIL) 25 MG tablet Take 1 tablet (25 mg total) by mouth daily. (Patient not taking: Reported on 03/21/2018) 30 tablet 6   No current facility-administered medications for this  visit.     Allergies:  Allergies  Allergen Reactions  . Adhesive [Tape] Rash  . Latex Rash    Past Surgical History:  Procedure Laterality Date  . CARDIAC CATHETERIZATION  06/2010  . CAROTID ENDARTERECTOMY  2007   Right  . CHOLECYSTECTOMY  07/2010  . CORONARY ARTERY BYPASS GRAFT  05/12/2005   LIMA-LAD, VG-DIAG; VG-LCX,OM; VG-RCA  . IR RADIOLOGIST EVAL & MGMT  07/13/2017  . IR RADIOLOGIST EVAL & MGMT  09/20/2017  . IR RENAL BILAT S&I MOD SED  08/09/2017  . IR TRANSCATH PLC STENT 1ST ART NOT LE CV CAR VERT CAR  08/09/2017  . IR US GUIDE VASC ACCESS RIGHT  08/09/2017    Social History   Socioeconomic History  . Marital status: Married    Spouse name: [name]  . Number of children: 0  . Years of education: Not on file  . Highest education level: Not on file  Occupational History  .  Occupation: Retired    Comment: Theme park manager: RETIRED  Social Needs  . Financial resource strain: Not on file  . Food insecurity:    Worry: Not on file    Inability: Not on file  . Transportation needs:    Medical: Not on file    Non-medical: Not on file  Tobacco Use  . Smoking status: Never Smoker  . Smokeless tobacco: Never Used  Substance and Sexual Activity  . Alcohol use: Yes    Alcohol/week: 0.0 standard drinks    Comment: socially  . Drug use: No  . Sexual activity: Yes  Lifestyle  . Physical activity:    Days per week: Not on file    Minutes per session: Not on file  . Stress: Not on file  Relationships  . Social connections:    Talks on phone: Not on file    Gets together: Not on file    Attends religious service: Not on file    Active member of club or organization: Not on file    Attends meetings of clubs or organizations: Not on file    Relationship status: Not on file  Other Topics Concern  . Not on file  Social History Narrative   Last name is pronounced "cas ka" No caffeine use. Always uses seat belts. Exercise:Moderate walks 30 min daily and rides a bike. Uses treadmill 20 mins everyday. Smoke alarm in home, No guns in the home.      Marital status:  Married x 12 years: happily married. Most of family in [place].      Children; none      Lives: with wife      Employment: retired.      Tobacco; never       Alcohol: wine after lunch daily.      Exercise:  Treadmill 20 minutes per day seven days per week.  Also walking throughout the day.      Advanced Directives: YES: FULL CODE no prolonged measures.      ADLs:  Independent with ADLs; no assistant devices.  Drives.     Family History  Problem Relation Age of Onset  . Diabetes Mother   . Cancer Brother 70       colon cancer     ROS Review of Systems See HPI Constitution: No fevers or chills No malaise No diaphoresis Skin: No rash or itching Eyes: no blurry vision, no double  vision GU: no dysuria or hematuria Neuro: no dizziness or headaches all others  reviewed and negative   Objective: Vitals:   03/21/18 1036  BP: (!) 142/66  Pulse: 65  Resp: 16  Temp: 98.2 F (36.8 C)  TempSrc: Oral  SpO2: 97%  Weight: 163 lb 12.8 oz (74.3 kg)  Height: 5\' 7"  (1.702 m)   BP Readings from Last 3 Encounters:  03/21/18 (!) 142/66  09/20/17 126/69  08/13/17 (!) 160/70    Physical Exam General: alert, oriented, in NAD Head: normocephalic, atraumatic, no sinus tenderness Eyes: EOM intact, no scleral icterus or conjunctival injection Ears: TM bulging with clear fluid  Nose: mucosa nonerythematous, nonedematous Throat: no pharyngeal exudate or erythema Lymph: no posterior auricular, submental or cervical lymph adenopathy Heart: normal rate, normal sinus rhythm, no murmurs Lungs: clear to auscultation bilaterally, no wheezing    Assessment and Plan Seyon was seen today for runny nose, headache and cough.  Diagnoses and all orders for this visit:  Resting tremor- benign appearing  Seasonal allergic rhinitis due to pollen- discussed antihistamine but pt sites back reaction to antihistamine  Acute URI- flonase for congestion and lozenges as needed   Other orders -     fluticasone (FLONASE) 50 MCG/ACT nasal spray; Place 2 sprays into both nostrils daily.     [place]

## 2018-03-25 ENCOUNTER — Other Ambulatory Visit: Payer: Self-pay | Admitting: Cardiovascular Disease

## 2018-05-01 ENCOUNTER — Observation Stay (HOSPITAL_COMMUNITY)
Admission: EM | Admit: 2018-05-01 | Discharge: 2018-05-02 | Disposition: A | Discharge status: Home / Self Care | Payer: Medicare Other | Attending: Cardiovascular Disease | Admitting: Cardiovascular Disease

## 2018-05-01 ENCOUNTER — Other Ambulatory Visit: Payer: Self-pay

## 2018-05-01 DIAGNOSIS — I480 Paroxysmal atrial fibrillation: Secondary | ICD-10-CM | POA: Insufficient documentation

## 2018-05-01 DIAGNOSIS — R001 Bradycardia, unspecified: Secondary | ICD-10-CM | POA: Diagnosis not present

## 2018-05-01 DIAGNOSIS — I10 Essential (primary) hypertension: Secondary | ICD-10-CM | POA: Diagnosis not present

## 2018-05-01 DIAGNOSIS — Z79899 Other long term (current) drug therapy: Secondary | ICD-10-CM | POA: Diagnosis not present

## 2018-05-01 DIAGNOSIS — R112 Nausea with vomiting, unspecified: Secondary | ICD-10-CM | POA: Diagnosis not present

## 2018-05-01 DIAGNOSIS — Z9049 Acquired absence of other specified parts of digestive tract: Secondary | ICD-10-CM | POA: Diagnosis not present

## 2018-05-01 DIAGNOSIS — Z91040 Latex allergy status: Secondary | ICD-10-CM | POA: Insufficient documentation

## 2018-05-01 DIAGNOSIS — I2510 Atherosclerotic heart disease of native coronary artery without angina pectoris: Secondary | ICD-10-CM | POA: Diagnosis not present

## 2018-05-01 DIAGNOSIS — E785 Hyperlipidemia, unspecified: Secondary | ICD-10-CM | POA: Insufficient documentation

## 2018-05-01 DIAGNOSIS — I2583 Coronary atherosclerosis due to lipid rich plaque: Secondary | ICD-10-CM

## 2018-05-01 DIAGNOSIS — R42 Dizziness and giddiness: Principal | ICD-10-CM | POA: Insufficient documentation

## 2018-05-01 DIAGNOSIS — Z7951 Long term (current) use of inhaled steroids: Secondary | ICD-10-CM | POA: Insufficient documentation

## 2018-05-01 DIAGNOSIS — Z951 Presence of aortocoronary bypass graft: Secondary | ICD-10-CM | POA: Diagnosis not present

## 2018-05-01 DIAGNOSIS — Z7982 Long term (current) use of aspirin: Secondary | ICD-10-CM | POA: Insufficient documentation

## 2018-05-01 DIAGNOSIS — Z888 Allergy status to other drugs, medicaments and biological substances status: Secondary | ICD-10-CM | POA: Diagnosis not present

## 2018-05-01 DIAGNOSIS — I129 Hypertensive chronic kidney disease with stage 1 through stage 4 chronic kidney disease, or unspecified chronic kidney disease: Secondary | ICD-10-CM | POA: Insufficient documentation

## 2018-05-01 DIAGNOSIS — K219 Gastro-esophageal reflux disease without esophagitis: Secondary | ICD-10-CM | POA: Diagnosis not present

## 2018-05-01 DIAGNOSIS — I6529 Occlusion and stenosis of unspecified carotid artery: Secondary | ICD-10-CM | POA: Diagnosis not present

## 2018-05-01 DIAGNOSIS — N183 Chronic kidney disease, stage 3 (moderate): Secondary | ICD-10-CM | POA: Insufficient documentation

## 2018-05-01 DIAGNOSIS — Z8619 Personal history of other infectious and parasitic diseases: Secondary | ICD-10-CM | POA: Insufficient documentation

## 2018-05-01 DIAGNOSIS — Z9889 Other specified postprocedural states: Secondary | ICD-10-CM | POA: Diagnosis not present

## 2018-05-01 DIAGNOSIS — Z85828 Personal history of other malignant neoplasm of skin: Secondary | ICD-10-CM | POA: Insufficient documentation

## 2018-05-01 LAB — BASIC METABOLIC PANEL
Anion gap: 10 (ref 5–15)
BUN: 19 mg/dL (ref 8–23)
CHLORIDE: 105 mmol/L (ref 98–111)
CO2: 22 mmol/L (ref 22–32)
CREATININE: 1.58 mg/dL — AB (ref 0.61–1.24)
Calcium: 8.7 mg/dL — ABNORMAL LOW (ref 8.9–10.3)
GFR calc non Af Amer: 41 mL/min — ABNORMAL LOW (ref >60)
GFR, EST AFRICAN AMERICAN: 47 mL/min — AB (ref >60)
Glucose, Bld: 149 mg/dL — ABNORMAL HIGH (ref 70–99)
POTASSIUM: 3.8 mmol/L (ref 3.5–5.1)
SODIUM: 137 mmol/L (ref 135–145)

## 2018-05-01 LAB — CBC WITH DIFFERENTIAL/PLATELET
Abs Immature Granulocytes: 0.05 10*3/uL (ref 0.00–0.07)
BASOS PCT: 1 %
Basophils Absolute: 0.1 10*3/uL (ref 0.0–0.1)
EOS PCT: 1 %
Eosinophils Absolute: 0.1 10*3/uL (ref 0.0–0.5)
HCT: 49.6 % (ref 39.0–52.0)
HEMOGLOBIN: 16.7 g/dL (ref 13.0–17.0)
Immature Granulocytes: 1 %
LYMPHS PCT: 8 %
Lymphs Abs: 0.7 10*3/uL (ref 0.7–4.0)
MCH: 30 pg (ref 26.0–34.0)
MCHC: 33.7 g/dL (ref 30.0–36.0)
MCV: 89 fL (ref 80.0–100.0)
Monocytes Absolute: 0.6 10*3/uL (ref 0.1–1.0)
Monocytes Relative: 6 %
NRBC: 0 % (ref 0.0–0.2)
Neutro Abs: 7.5 10*3/uL (ref 1.7–7.7)
Neutrophils Relative %: 83 %
PLATELETS: 155 10*3/uL (ref 150–400)
RBC: 5.57 MIL/uL (ref 4.22–5.81)
RDW: 13.1 % (ref 11.5–15.5)
WBC: 9 10*3/uL (ref 4.0–10.5)

## 2018-05-01 LAB — TROPONIN I

## 2018-05-01 LAB — TSH: TSH: 1.052 u[IU]/mL (ref 0.350–4.500)

## 2018-05-01 LAB — CBG MONITORING, ED: Glucose-Capillary: 144 mg/dL — ABNORMAL HIGH (ref 70–99)

## 2018-05-01 MED ORDER — SODIUM CHLORIDE 0.9 % IV BOLUS
1000.0000 mL | Freq: Once | INTRAVENOUS | Status: AC
  Administered 2018-05-01: 1000 mL via INTRAVENOUS

## 2018-05-01 MED ORDER — ATORVASTATIN CALCIUM 40 MG PO TABS
40.0000 mg | ORAL_TABLET | Freq: Every day | ORAL | Status: DC
  Administered 2018-05-01: 40 mg via ORAL
  Filled 2018-05-01: qty 1

## 2018-05-01 MED ORDER — FENOFIBRATE 54 MG PO TABS
54.0000 mg | ORAL_TABLET | Freq: Every day | ORAL | Status: DC
  Filled 2018-05-01: qty 1

## 2018-05-01 MED ORDER — ASPIRIN EC 81 MG PO TBEC
81.0000 mg | DELAYED_RELEASE_TABLET | Freq: Every day | ORAL | Status: DC
  Administered 2018-05-01: 81 mg via ORAL
  Filled 2018-05-01: qty 1

## 2018-05-01 MED ORDER — DIAZEPAM 5 MG/ML IJ SOLN
2.5000 mg | Freq: Once | INTRAMUSCULAR | Status: DC
  Filled 2018-05-01: qty 2

## 2018-05-01 MED ORDER — DIMENHYDRINATE 50 MG PO TABS
50.0000 mg | ORAL_TABLET | Freq: Three times a day (TID) | ORAL | Status: DC | PRN
  Filled 2018-05-01: qty 1

## 2018-05-01 MED ORDER — ONDANSETRON HCL 4 MG/2ML IJ SOLN
4.0000 mg | Freq: Once | INTRAMUSCULAR | Status: AC
  Administered 2018-05-01: 4 mg via INTRAVENOUS
  Filled 2018-05-01: qty 2

## 2018-05-01 MED ORDER — EZETIMIBE 10 MG PO TABS
10.0000 mg | ORAL_TABLET | Freq: Every day | ORAL | Status: DC
  Administered 2018-05-01: 10 mg via ORAL
  Filled 2018-05-01 (×2): qty 1

## 2018-05-01 MED ORDER — PROMETHAZINE HCL 25 MG/ML IJ SOLN
12.5000 mg | Freq: Once | INTRAMUSCULAR | Status: AC
Start: 2018-05-01 — End: 2018-05-01
  Administered 2018-05-01: 12.5 mg via INTRAVENOUS
  Filled 2018-05-01: qty 1

## 2018-05-01 MED ORDER — FUROSEMIDE 20 MG PO TABS
10.0000 mg | ORAL_TABLET | Freq: Every day | ORAL | Status: DC
  Administered 2018-05-01 – 2018-05-02 (×2): 10 mg via ORAL
  Filled 2018-05-01 (×2): qty 1

## 2018-05-01 MED ORDER — FENOFIBRIC ACID 45 MG PO CPDR: 45.0000 mg | DELAYED_RELEASE_CAPSULE | Freq: Every day | ORAL | Status: DC

## 2018-05-01 NOTE — ED Provider Notes (Signed)
[facility] DEPT Provider Note   CSN: [id number] Arrival date & time: 05/01/18  0831     History   Chief Complaint Chief Complaint  Patient presents with  . Dizziness    HPI [name] is a 77 y.o. male.  HPI  77 year old male with a history of vertigo presents with recurrent vertigo and vomiting.  He states that he will have periods and even years where he will not have dizziness.  However about a month ago he started having recurrent vertigo.  About once or twice a week.  This morning around 6 AM he started to feel warm, like he usually does when this starts.  Then he started taking his meclizine.  He is felt progressively worse including vomiting and dizziness.  Feels like the room is spinning.  There is no headache, blurry vision, weakness or numbness.  No chest pain.  He had one loose stool and feels like he needs to go to the bathroom but otherwise no diarrhea.  The dizziness is significantly improved but he still having some vomiting and feeling weak. Chronic left tinnitus/decreased hearing that is unchanged.  Past Medical History:  Diagnosis Date  . Anemia, unspecified   . Basal cell carcinoma (BCC) of right forearm    [name]  . Benign paroxysmal positional vertigo   . Blood transfusion without reported diagnosis   . CAD (coronary artery disease)    HX CABG 2006 X 4; last nuc 11/2010 last cath 2012-wth 95% OM too small for intervention, treated medically   . Carcinoma in situ of skin, site unspecified   . Cerebrovascular disease, unspecified    hx CEA-RT; KNOWN AOTOILIAC 7 LT sfa disease  . Chronic renal insufficiency    glomerulonephritis [id number]  . Congenital anomaly of tongue, unspecified   . Esophageal reflux   . Essential hypertension, benign   . Genital herpes, unspecified   . History of stress test 06/2010  . Hx of echocardiogram 01/2005   EF 55% normal study  . Lung mass   . PAF (paroxysmal atrial fibrillation)  ([place])    post CABG, maintaining SR  . Peripheral vascular disease, unspecified ([place])   . Pure hypercholesterolemia     Patient Active Problem List   Diagnosis Date Noted  . Vertigo   . Resting tremor 03/21/2018  . Tinnitus 08/30/2017  . Conductive hearing loss of both ears 05/30/2017  . Family history of colon cancer 03/22/2017  . Acute URI 10/26/2016  . Peripheral arterial disease ([place]) 03/07/2013  . Bradycardia 02/05/2013  . 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 CAD in native artery 02/05/2013  . Essential hypertension 09/04/2012  . Pure hypercholesterolemia 09/04/2012  . Abdominal pain, epigastric 09/04/2012  . Liver function test abnormality 09/04/2012  . Coronary artery disease 09/04/2012  . Occlusion and stenosis of carotid artery 09/04/2012  . HYPERLIPIDEMIA 11/24/2009  . Disorder of kidney and ureter 11/24/2009  . CT, CHEST, ABNORMAL 11/24/2009    Past Surgical History:  Procedure Laterality Date  . CARDIAC CATHETERIZATION  06/2010  . CAROTID ENDARTERECTOMY  2007   Right  . CHOLECYSTECTOMY  07/2010  . CORONARY ARTERY BYPASS GRAFT  05/12/2005   LIMA-LAD, VG-DIAG; VG-LCX,OM; VG-RCA  . IR RADIOLOGIST EVAL & MGMT  07/13/2017  . IR RADIOLOGIST EVAL & MGMT  09/20/2017  . IR RENAL BILAT S&I MOD SED  08/09/2017  . IR TRANSCATH PLC STENT 1ST ART NOT LE CV CAR VERT CAR  08/09/2017  . IR US GUIDE  VASC ACCESS RIGHT  08/09/2017        Home Medications    Prior to Admission medications   Medication Sig Start Date End Date Taking? Authorizing Provider  acyclovir (ZOVIRAX) 400 MG tablet Take 1 tablet (400 mg total) by mouth 5 (five) times daily. Patient taking differently: Take 400 mg by mouth 5 (five) times daily. Takes as needed for herpes 03/16/17  Yes [name], MD  aspirin EC 81 MG tablet Take 81 mg by mouth at bedtime.   Yes [provider]  Choline Fenofibrate (FENOFIBRIC ACID) 45 MG CPDR TAKE 1 CAPSULE (45MG ) BY MOUTH AT BEDTIME Patient taking  differently: Take 45 mg by mouth at bedtime.  06/09/17  Yes [name], MD  dimenhyDRINATE (DRAMAMINE) 50 MG tablet Take 50 mg by mouth every 8 (eight) hours as needed (vertigo).   Yes [provider]  esomeprazole (NEXIUM) 40 MG capsule Take 40 mg by mouth daily as needed (heart burn).    Yes [provider]  ezetimibe (ZETIA) 10 MG tablet TAKE 1 TABLET (10 MG TOTAL) BY MOUTH DAILY. 06/09/17  Yes [name], MD  fluticasone (FLONASE) 50 MCG/ACT nasal spray Place 2 sprays into both nostrils daily. 03/21/18  Yes Stallings, Zoe A, MD  furosemide (LASIX) 20 MG tablet Take 10 mg by mouth daily.   Yes [provider]  metoprolol tartrate (LOPRESSOR) 25 MG tablet Take 12.5 mg by mouth 2 (two) times daily.   Yes [provider]  nitroGLYCERIN (NITROSTAT) 0.4 MG SL tablet Place 1 tablet (0.4 mg total) under the tongue every 5 (five) minutes as needed for chest pain. 03/16/17  Yes [name], MD  simvastatin (ZOCOR) 80 MG tablet TAKE 1 TABLET (80 MG TOTAL) BY MOUTH DAILY AT 6 PM. 03/27/18  Yes [name], MD  hydrochlorothiazide (HYDRODIURIL) 25 MG tablet Take 1 tablet (25 mg total) by mouth daily. Patient not taking: Reported on 03/21/2018 06/03/17   [name], MD    Family History Family History  Problem Relation Age of Onset  . Diabetes Mother   . Cancer Brother 56       colon cancer    Social History Social History   Tobacco Use  . Smoking status: Never Smoker  . Smokeless tobacco: Never Used  Substance Use Topics  . Alcohol use: Yes    Alcohol/week: 0.0 standard drinks    Comment: socially  . Drug use: No     Allergies   Adhesive [tape] and Latex   Review of Systems Review of Systems  Constitutional: Negative for fever.  Eyes: Negative for visual disturbance.  Respiratory: Negative for shortness of breath.   Cardiovascular: Negative for chest pain.  Gastrointestinal: Positive for nausea and vomiting. Negative  for abdominal pain.  Neurological: Positive for dizziness. Negative for weakness, numbness and headaches.  All other systems reviewed and are negative.    Physical Exam Updated Vital Signs BP (!) 119/55   Pulse (!) 59   Temp 97.8 F (36.6 C) (Oral)   Resp 16   SpO2 100%   Physical Exam  Constitutional: He appears well-developed and well-nourished.  HENT:  Head: Normocephalic and atraumatic.  Right Ear: External ear normal.  Left Ear: Tympanic membrane and external ear normal.  Nose: Nose normal.  Mouth/Throat: No oropharyngeal exudate.  Eyes: Pupils are equal, round, and reactive to light. EOM are normal. Right eye exhibits no discharge. Left eye exhibits no discharge. Right eye exhibits no nystagmus.  Left eye exhibits no nystagmus.  Neck: Neck supple.  Cardiovascular: Normal rate, regular rhythm and normal heart sounds.  Pulmonary/Chest: Effort normal and breath sounds normal.  Abdominal: Soft. There is no tenderness.  Musculoskeletal: He exhibits no edema.  Neurological: He is alert.  CN 3-12 grossly intact. 5/5 strength in all 4 extremities. Grossly normal sensation. Normal finger to nose.   Skin: Skin is warm and dry.  Psychiatric: His mood appears not anxious.  Nursing note and vitals reviewed.    ED Treatments / Results  Labs (all labs ordered are listed, but only abnormal results are displayed) Labs Reviewed  BASIC METABOLIC PANEL - Abnormal; Notable for the following components:      Result Value   Glucose, Bld 149 (*)    Creatinine, Ser 1.58 (*)    Calcium 8.7 (*)    GFR calc non Af Amer 41 (*)    GFR calc Af Amer 47 (*)    All other components within normal limits  CBG MONITORING, ED - Abnormal; Notable for the following components:   Glucose-Capillary 144 (*)    All other components within normal limits  CBC WITH DIFFERENTIAL/PLATELET  TROPONIN I  TROPONIN I  TROPONIN I  TSH    EKG EKG Interpretation  Date/Time:  Monday May 01 2018  08:53:36 EST Ventricular Rate:  77 PR Interval:    QRS Duration: 100 QT Interval:  410 QTC Calculation: 464 R Axis:   -23 Text Interpretation:  Sinus rhythm Borderline left axis deviation RSR' in V1 or V2, right VCD or RVH similar to Dec 2018 Confirmed by Sherwood Gambler [phone]) on 05/01/2018 9:13:13 AM   Radiology No results found.  Procedures Procedures (including critical care time)  Medications Ordered in ED Medications  ezetimibe (ZETIA) tablet 10 mg (has no administration in time range)  furosemide (LASIX) tablet 10 mg (has no administration in time range)  sodium chloride 0.9 % bolus 1,000 mL (0 mLs Intravenous Stopped 05/01/18 1033)  ondansetron (ZOFRAN) injection 4 mg (4 mg Intravenous Given 05/01/18 0912)  promethazine (PHENERGAN) injection 12.5 mg (12.5 mg Intravenous Given 05/01/18 1034)     Initial Impression / Assessment and Plan / ED Course  I have reviewed the triage vital signs and the nursing notes.  Pertinent labs & imaging results that were available during my care of the patient were reviewed by me and considered in my medical decision making (see chart for details).     Patient does not have any nystagmus at this time.  His neuro exam is reassuring.  He seems to be much better except he still having some vomiting.  However I do not think he is likely to be having an acute stroke.  There are no chest symptoms to suggest ACS.  While in the ED, during episodes of vomiting, he would have significant bradycardia into the 30s.  At least once he also had a brief pause as well.  Given this, cardiology was consulted and they have decided to admit and hold metoprolol and watch on telemetry.  Final Clinical Impressions(s) / ED Diagnoses   Final diagnoses:  Vertigo  Bradycardia    ED Discharge Orders    None       Sherwood Gambler, MD 05/01/18 1528

## 2018-05-01 NOTE — ED Notes (Signed)
Report given to [facility].

## 2018-05-01 NOTE — ED Notes (Signed)
[facility] notified of patient need for transport to [facility].

## 2018-05-01 NOTE — ED Notes (Signed)
Bed: JT70 Expected date:  Expected time:  Means of arrival:  Comments: EMS vertigo/hypertensive

## 2018-05-01 NOTE — ED Notes (Signed)
Pt provided water.  Lunch tray has been ordered.

## 2018-05-01 NOTE — ED Triage Notes (Signed)
N/v/d and dizziness since 0600.  States he feels like this is his vertigo, pt reports 3 weeks increased symptoms of vertigo. Pt states today is worse than normal.   V/S 190/100; HR 56; R 16; CBG 134.    18g R AC, 4mg  zofran with improvement.    AOx4

## 2018-05-01 NOTE — ED Notes (Signed)
Cardiology at bedside.

## 2018-05-01 NOTE — H&P (Signed)
Cardiology H&P:   Patient ID: JDEN WANT MRN: [id number]; DOB: 10-May-1941  Admit date: 05/01/2018 Date of Consult: 05/01/2018  Primary Care Provider: Forrest Moron, MD Primary Cardiologist: Quay Burow, MD  Primary Electrophysiologist:  None    Patient Profile:   [name] is a 77 y.o. male with a hx of CAD s/p CABG in 2006 w/ patent grafts on repeat cath in 2012, PVD, HTN, HLD, renal insuffiencey and vertigo who is being seen today for the evaluation of bradycardia and sinus pause of 4.5 sec at the request of Dr. Colin Mulders, ED physician.   History of Present Illness:   [name] is a 77 y.o. male followed by Dr. Gwenlyn Found. He has a history of CAD and PVOD. He had coronary artery bypass grafting in 2006 with a LIMA to his LAD, a vein to the RCA, circumflex, and diagonal branches. He did develop paroxysmal atrial fibrillation postoperatively. He has PVOD, status post right carotid endarterectomy performed by Dr. [name], as well as a known aortoiliac and left SFA disease, but has denied claudication in the past. His other problems include treated hypertension and dyslipidemia. He had a repeat cardiac catheterization July 13, 2010, and found to have patent grafts with moderate aortoiliac disease. He does have moderate renal insufficiency followed by Dr. [name]. He has had laparoscopic cholecystectomy that was uncomplicated in the past. He did have a negative Myoview stress test 8/14. He was last seen by Dr. Gwenlyn Found 06/03/17 and was doing great from a CV standpoint. He denied cardiac symptoms. No CP or dyspnea. Also no claudication. Exerting daily on the treadmill w/o limitations.   He now presents to the [facility] ED w/ CC of dizziness and vomiting, c/w vertigo, which he has recently ben bothered by. This morning his vertigo was severe and would not resolve on its own, prompting him to come to the ED. The room was spinning, he was nauseated w/ vomiting. No CP or dyspnea.   In  the ED, he was noted to be bradycardic w/ rates in the 40s-60s and a sinus pause of 4.5 sec. Cardiology asked to evaluate. He is on an AV nodal blocking agent at home, metoprolol 12.5 mg BID. He notes that last night he took his medications and then shortly after found a half tablet on the floor. He second guessed if he had actually taken his metoprolol and took 12.5 mg at that time (may have taken a total of 25 mg). He notes his usual baseline HR is in the 60-70s.  In ED, K is WNL at 3.8. SCr is 1.58. CBC unremarkable. No TSH lab obtained.   His vertigo has improved. No further dizziness, but he continues to feel nauseated. No CP or dyspnea. He notes that his symptoms prior to undergoing CABG in 2006 were classic. Exertional substernal chest pressure/tightness improving with rest. He denies any recent symptoms.   Past Medical History:  Diagnosis Date  . Anemia, unspecified   . Basal cell carcinoma (BCC) of right forearm    [name]  . Benign paroxysmal positional vertigo   . Blood transfusion without reported diagnosis   . CAD (coronary artery disease)    HX CABG 2006 X 4; last nuc 11/2010 last cath 2012-wth 95% OM too small for intervention, treated medically   . Carcinoma in situ of skin, site unspecified   . Cerebrovascular disease, unspecified    hx CEA-RT; KNOWN AOTOILIAC 7 LT sfa disease  . Chronic renal insufficiency  glomerulonephritis 5176160.7  . Congenital anomaly of tongue, unspecified   . Esophageal reflux   . Essential hypertension, benign   . Genital herpes, unspecified   . History of stress test 06/2010  . Hx of echocardiogram 01/2005   EF 55% normal study  . Lung mass   . PAF (paroxysmal atrial fibrillation) ([place])    post CABG, maintaining SR  . Peripheral vascular disease, unspecified ([place])   . Pure hypercholesterolemia     Past Surgical History:  Procedure Laterality Date  . CARDIAC CATHETERIZATION  06/2010  . CAROTID ENDARTERECTOMY  2007   Right  .  CHOLECYSTECTOMY  07/2010  . CORONARY ARTERY BYPASS GRAFT  05/12/2005   LIMA-LAD, VG-DIAG; VG-LCX,OM; VG-RCA  . IR RADIOLOGIST EVAL & MGMT  07/13/2017  . IR RADIOLOGIST EVAL & MGMT  09/20/2017  . IR RENAL BILAT S&I MOD SED  08/09/2017  . IR TRANSCATH PLC STENT 1ST ART NOT LE CV CAR VERT CAR  08/09/2017  . IR US GUIDE VASC ACCESS RIGHT  08/09/2017     Home Medications:  Prior to Admission medications   Medication Sig Start Date End Date Taking? Authorizing Provider  acyclovir (ZOVIRAX) 400 MG tablet Take 1 tablet (400 mg total) by mouth 5 (five) times daily. Patient taking differently: Take 400 mg by mouth 5 (five) times daily. Takes as needed for herpes 03/16/17  Yes [name], MD  aspirin EC 81 MG tablet Take 81 mg by mouth at bedtime.   Yes [provider]  Choline Fenofibrate (FENOFIBRIC ACID) 45 MG CPDR TAKE 1 CAPSULE (45MG ) BY MOUTH AT BEDTIME Patient taking differently: Take 45 mg by mouth at bedtime.  06/09/17  Yes [name], MD  dimenhyDRINATE (DRAMAMINE) 50 MG tablet Take 50 mg by mouth every 8 (eight) hours as needed (vertigo).   Yes [provider]  esomeprazole (NEXIUM) 40 MG capsule Take 40 mg by mouth daily as needed (heart burn).    Yes [provider]  ezetimibe (ZETIA) 10 MG tablet TAKE 1 TABLET (10 MG TOTAL) BY MOUTH DAILY. 06/09/17  Yes [name], MD  fluticasone (FLONASE) 50 MCG/ACT nasal spray Place 2 sprays into both nostrils daily. 03/21/18  Yes Stallings, Zoe A, MD  furosemide (LASIX) 20 MG tablet Take 10 mg by mouth daily.   Yes [provider]  metoprolol tartrate (LOPRESSOR) 25 MG tablet Take 12.5 mg by mouth 2 (two) times daily.   Yes [provider]  nitroGLYCERIN (NITROSTAT) 0.4 MG SL tablet Place 1 tablet (0.4 mg total) under the tongue every 5 (five) minutes as needed for chest pain. 03/16/17  Yes [name], MD  simvastatin (ZOCOR) 80 MG tablet TAKE 1 TABLET (80 MG TOTAL) BY MOUTH DAILY AT 6 PM.  03/27/18  Yes [name], MD  hydrochlorothiazide (HYDRODIURIL) 25 MG tablet Take 1 tablet (25 mg total) by mouth daily. Patient not taking: Reported on 03/21/2018 06/03/17   [name], MD    Inpatient Medications: Scheduled Meds:  Continuous Infusions:  PRN Meds:   Allergies:    Allergies  Allergen Reactions  . Adhesive [Tape] Rash  . Latex Rash    Social History:   Social History   Socioeconomic History  . Marital status: Married    Spouse name: [name]  . Number of children: 0  . Years of education: Not on file  . Highest education level: Not on file  Occupational History  . Occupation: Retired    Comment: Passenger transport manager  Employer: RETIRED  Social Needs  . Financial resource strain: Not on file  . Food insecurity:    Worry: Not on file    Inability: Not on file  . Transportation needs:    Medical: Not on file    Non-medical: Not on file  Tobacco Use  . Smoking status: Never Smoker  . Smokeless tobacco: Never Used  Substance and Sexual Activity  . Alcohol use: Yes    Alcohol/week: 0.0 standard drinks    Comment: socially  . Drug use: No  . Sexual activity: Yes  Lifestyle  . Physical activity:    Days per week: Not on file    Minutes per session: Not on file  . Stress: Not on file  Relationships  . Social connections:    Talks on phone: Not on file    Gets together: Not on file    Attends religious service: Not on file    Active member of club or organization: Not on file    Attends meetings of clubs or organizations: Not on file    Relationship status: Not on file  . Intimate partner violence:    Fear of current or ex partner: Not on file    Emotionally abused: Not on file    Physically abused: Not on file    Forced sexual activity: Not on file  Other Topics Concern  . Not on file  Social History Narrative   Last name is pronounced "cas ka" No caffeine use. Always uses seat belts. Exercise:Moderate walks 30 min daily and rides a bike.  Uses treadmill 20 mins everyday. Smoke alarm in home, No guns in the home.      Marital status:  Married x 12 years: happily married. Most of family in [place].      Children; none      Lives: with wife      Employment: retired.      Tobacco; never       Alcohol: wine after lunch daily.      Exercise:  Treadmill 20 minutes per day seven days per week.  Also walking throughout the day.      Advanced Directives: YES: FULL CODE no prolonged measures.      ADLs:  Independent with ADLs; no assistant devices.  Drives.     Family History:    Family History  Problem Relation Age of Onset  . Diabetes Mother   . Cancer Brother 90       colon cancer     ROS:  Please see the history of present illness.   All other ROS reviewed and negative.     Physical Exam/Data:   Vitals:   05/01/18 0916 05/01/18 0930 05/01/18 1100 05/01/18 1130  BP:  (!) 153/58 (!) 125/58 (!) 106/50  Pulse: (!) 46 65 62 60  Resp: 20 18 (!) 24 19  Temp:      TempSrc:      SpO2: 100% 100% 100% 98%    Intake/Output Summary (Last 24 hours) at 05/01/2018 1224 Last data filed at 05/01/2018 1033 Gross per 24 hour  Intake 1000 ml  Output -  Net 1000 ml   There were no vitals filed for this visit. There is no height or weight on file to calculate BMI.  General:  Well nourished, well developed, in no acute distress HEENT: normal Lymph: no adenopathy Neck: no JVD Endocrine:  No thryomegaly Vascular: No carotid bruits; FA pulses 2+ bilaterally without bruits  Cardiac:  Regular rhythm,  bradycardia; no murmur  Lungs:  clear to auscultation bilaterally, no wheezing, rhonchi or rales  Abd: soft, nontender, no hepatomegaly  Ext: no edema Musculoskeletal:  No deformities, BUE and BLE strength normal and equal Skin: warm and dry  Neuro:  CNs 2-12 intact, no focal abnormalities noted Psych:  Normal affect   EKG:  The EKG was personally reviewed and demonstrates:  SR, 77 bpm Telemetry:  Telemetry was personally  reviewed and demonstrates:  Sinus brady/ SR, mid 40s-60s, sinus pause 4.5 sec on tele  Relevant CV Studies:  LHC 10/28/10  CONCLUSION:  1.  Significant left main and 3-vessel coronary artery disease.  2.  Patent lumen to left anterior descending with 50% lesion in the left      anterior descending beyond the OM insertion.  3.  Patent vein graft to the diagonal.  4.  Patent but diffusely diseased vein graft to the obtuse marginal.  5.  Total occluded vein graft to the right coronary artery.  6.  Normal left ventricular systolic function.  7.  No evidence of ascending aortic dissection or dilatation.  2D Echo 05/2005 SUMMARY - Overall left ventricular systolic function was normal. Left    ventricular ejection fraction was estimated to be 55 %. - Aortic valve thickness was mildly increased. IMPRESSIONS - If clinically indicated, transesophageal echocardiography could    provide additional information.  Myocardial Perfusion Study 01/2013 Impression Exercise Capacity:  Lexiscan with no exercise. BP Response:  Normal blood pressure response. Clinical Symptoms:  No significant symptoms noted. ECG Impression:  No significant ST segment change suggestive of ischemia. Comparison with Prior Nuclear Study: A similar inferolateral defect is seen on the 2012 study  Overall Impression:  Low risk stress nuclear study with possible small inferolateral infarct..  LV Wall Motion:  the study could not be gated.  Laboratory Data:  Chemistry Recent Labs  Lab 05/01/18 0917  NA 137  K 3.8  CL 105  CO2 22  GLUCOSE 149*  BUN 19  CREATININE 1.58*  CALCIUM 8.7*  GFRNONAA 41*  GFRAA 47*  ANIONGAP 10    No results for input(s): PROT, ALBUMIN, AST, ALT, ALKPHOS, BILITOT in the last 168 hours. Hematology Recent Labs  Lab 05/01/18 0917  WBC 9.0  RBC 5.57  HGB 16.7  HCT 49.6  MCV 89.0  MCH 30.0  MCHC 33.7  RDW 13.1  PLT 155   Cardiac EnzymesNo results for  input(s): TROPONINI in the last 168 hours. No results for input(s): TROPIPOC in the last 168 hours.  BNPNo results for input(s): BNP, PROBNP in the last 168 hours.  DDimer No results for input(s): DDIMER in the last 168 hours.  Radiology/Studies:  No results found.  Assessment and Plan:    [name] is a 77 y.o. male with a hx of CAD s/p CABG in 2006 w/ patent grafts on repeat cath in 2012, PVD, HTN, HLD, renal insuffiencey and vertigo who is being seen today for the evaluation of bradycardia and sinus pause of 4.5 sec at the request of Dr. Colin Mulders, ED physician.   1. Sinus Bradycardia w/ Sinus Pause: variable rates on tele, ranging from the mid 40s- low 60s sinus. He had a sinus pause on tele, 4.5 sec. ? If vagal, given nausea with vertigo. He denies CP. No dyspnea. His dizziness has resolved. He is on an AV nodal blocking agent at home, metoprolol 12.5 mg BID and may have mistakenly taken double dose last night.  He notes that last  night he took his medications and then shortly after found a half tablet on the floor. He second guessed if he had actually taken his metoprolol and took 12.5 mg at that time (may have taken a total of 25 mg). He took an additional 12.5 this morning. He notes his usual baseline HR is in the 60-70s.  In ED, K is WNL at 3.8. SCr is 1.58. CBC unremarkable. No TSH lab obtained. Given his pause, we recommend admission for observation and further surveillance on tele. Will hold metoprolol. Will monitor rates. Check TSH. If recurrence despite metoprolol hold, he will need EP consult and possible ischemic w/u. If his rates creep up significantly, then we may try adding back his night dose of metoprolol. We will transfer to [facility] for admission to tele under cardiology service.   2. CAD: per above, h/o CABG in 2006 with patent grafts on repeat cath in 2012. He denies CP and dyspnea. Continue medical regimen but hold metoprolol due to bradycardia. Will continue to monitor on  tele. If he continues to have profound bradycardia and recurrent pauses, despite  blocker washout, then he will need ischemic evaluation.   3. Chronic Renal Insuffiencey: Baseline SCr 1.4-1.6. 1.58 today.   4. Vertigo: long history per pt report but increased recurrence over the last month. Severe episode this morning, now improved. PRN meclozine.    For questions or updates, please contact [place] Please consult [email or web address] for contact info under     Signed, Lyda Jester, PA-C  05/01/2018 12:24 PM

## 2018-05-02 ENCOUNTER — Telehealth: Payer: Self-pay | Admitting: *Deleted

## 2018-05-02 ENCOUNTER — Other Ambulatory Visit: Payer: Self-pay | Admitting: Cardiology

## 2018-05-02 DIAGNOSIS — I10 Essential (primary) hypertension: Secondary | ICD-10-CM | POA: Diagnosis not present

## 2018-05-02 DIAGNOSIS — R001 Bradycardia, unspecified: Secondary | ICD-10-CM | POA: Diagnosis not present

## 2018-05-02 DIAGNOSIS — R42 Dizziness and giddiness: Secondary | ICD-10-CM | POA: Diagnosis not present

## 2018-05-02 DIAGNOSIS — I2510 Atherosclerotic heart disease of native coronary artery without angina pectoris: Secondary | ICD-10-CM | POA: Diagnosis not present

## 2018-05-02 LAB — BASIC METABOLIC PANEL
ANION GAP: 8 (ref 5–15)
BUN: 17 mg/dL (ref 8–23)
CO2: 23 mmol/L (ref 22–32)
Calcium: 8.7 mg/dL — ABNORMAL LOW (ref 8.9–10.3)
Chloride: 106 mmol/L (ref 98–111)
Creatinine, Ser: 1.88 mg/dL — ABNORMAL HIGH (ref 0.61–1.24)
GFR, EST AFRICAN AMERICAN: 38 mL/min — AB (ref >60)
GFR, EST NON AFRICAN AMERICAN: 33 mL/min — AB (ref >60)
GLUCOSE: 99 mg/dL (ref 70–99)
POTASSIUM: 4.2 mmol/L (ref 3.5–5.1)
Sodium: 137 mmol/L (ref 135–145)

## 2018-05-02 NOTE — Care Management Obs Status (Signed)
[place] NOTIFICATION   Patient Details  Name: [name] MRN: [id number] Date of Birth: [birth year]   Medicare Observation Status Notification Given:  Yes    Erenest Rasher, RN 05/02/2018, 1:22 PM

## 2018-05-02 NOTE — Telephone Encounter (Signed)
Have opening today at 3:00 or 4:30 today to apply a 7 day cardiac event monitor.  Please call [phone] and ask to speak with Mack Guise if you would like to schedule.

## 2018-05-02 NOTE — Discharge Summary (Signed)
Discharge Summary    Patient ID: [name] MRN: [id number]; DOB: 24-Nov-1940  Admit date: 05/01/2018 Discharge date: 05/02/2018  Primary Care Provider: Forrest Moron, MD  Primary Cardiologist: Jose Burow, MD  Primary Electrophysiologist:  None   Discharge Diagnoses    Active Problems:   Essential hypertension   Bradycardia   CAD in native artery   Allergies Allergies  Allergen Reactions  . Adhesive [Tape] Rash  . Latex Rash    Diagnostic Studies/Procedures    None this admission.   History of Present Illness     [name] is a 77 y.o. male followed by Dr. Gwenlyn Price. He has a history of CAD and PVOD. He had coronary artery bypass grafting in 2006 with a LIMA to his LAD, a vein to the RCA, circumflex, and diagonal branches. He did develop paroxysmal atrial fibrillation postoperatively. He has PVOD, status post right carotid endarterectomy performed by Dr. Shanon Price, as well as a known aortoiliac and left SFA disease, but has denied claudication in the past. His other problems include treated hypertension and dyslipidemia. He had a repeat cardiac catheterization July 13, 2010, and Price to have patent grafts with moderate aortoiliac disease. He does have moderate renal insufficiency followed by Dr. Marval Price. He has had laparoscopic cholecystectomy that was uncomplicated in the past. He did have a negative Myoview stress test 8/14. He was last seen by Dr. Gwenlyn Price 06/03/17 and was doing great from a CV standpoint. He denied cardiac symptoms. No CP or dyspnea. Also no claudication. Exerting daily on the treadmill w/o limitations.   He presented to the [facility] ED on 05/01/18 w/ CC of dizziness and vomiting, c/w vertigo, which he has recently ben bothered by. Morning of ED presentation, his vertigo was severe and would not resolve on its own, prompting him to come to the ED. The room was spinning, he was nauseated w/ vomiting. No CP or dyspnea.   In the ED, he was noted to  be bradycardic w/ rates in the 40s-60s and a sinus pause of 4.5 sec. Cardiology asked to evaluate. He is on an AV nodal blocking agent at home, metoprolol 12.5 mg BID. He notes that the night prior to coming to the ED, he took his medications and then shortly after Price a half tablet on the floor. He second guessed if he had actually taken his metoprolol and took 12.5 mg at that time (may have taken a total of 25 mg). He notes his usual baseline HR is in the 60-70s.  In ED, K was WNL at 3.8. SCr was 1.58. CBC unremarkable.   When we evaluated him, His vertigo had improved. He denied further izziness, but he continued to feel nauseated. No CP or dyspnea. He noted that his symptoms prior to undergoing CABG in 2006 were classic. Exertional substernal chest pressure/tightness improving with rest. He denied any recent similar  symptoms.   Pt was seen in the ED by [name] who recommended he be admitted to [facility] under observation.   Hospital Course     Pt was admitted to telemetry. His metoprolol was held. Troponin was checked to r/o ACS and was negative. TSH was normal. He was monitored on tele overnight.  His HRs remained in the 50s-80s. He had no recurrent pauses. He was seen and examined by [name] the next day and was feeling better. He was no longer feeling vertiginous. He ambulated w/o difficulty. [name] suspected that his episode that occurred on 05/01/18 was the  perfect storm of vertigo, nausea with increased vagal tone, and taking extra metoprolol. She felt that he was stable for discharge home and recommended a 7-day event monitor for further surveillance. She also recommended that his metoprolol be held. She also advised that he keep his appt for his repeat carotid dopplers next month, given his h/o carotid artery stenosis.   7 day monitor and hospital f/u will be arranged.   Consultants: none   Discharge Vitals Blood pressure 129/62, pulse (!) 58, temperature (!) 97.4 F (36.3  C), temperature source Oral, resp. rate 20, height 5\' 9"  (1.753 m), weight 71.2 kg, SpO2 97 %.  Filed Weights   05/01/18 1818 05/02/18 0434  Weight: 72.2 kg 71.2 kg    Labs & Radiologic Studies    CBC Recent Labs    05/01/18 0917  WBC 9.0  NEUTROABS 7.5  HGB 16.7  HCT 49.6  MCV 89.0  PLT 101   Basic Metabolic Panel Recent Labs    05/01/18 0917 05/02/18 0422  NA 137 137  K 3.8 4.2  CL 105 106  CO2 22 23  GLUCOSE 149* 99  BUN 19 17  CREATININE 1.58* 1.88*  CALCIUM 8.7* 8.7*   Liver Function Tests No results for input(s): AST, ALT, ALKPHOS, BILITOT, PROT, ALBUMIN in the last 72 hours. No results for input(s): LIPASE, AMYLASE in the last 72 hours. Cardiac Enzymes Recent Labs    05/01/18 1253  TROPONINI <0.03   BNP Invalid input(s): POCBNP D-Dimer No results for input(s): DDIMER in the last 72 hours. Hemoglobin A1C No results for input(s): HGBA1C in the last 72 hours. Fasting Lipid Panel No results for input(s): CHOL, HDL, LDLCALC, TRIG, CHOLHDL, LDLDIRECT in the last 72 hours. Thyroid Function Tests Recent Labs    05/01/18 1624  TSH 1.052   _____________  No results Price. Disposition   Pt is being discharged home today in good condition.  Follow-up Plans & Appointments    Follow-up Information    Jose Harp, MD Follow up.   Specialties:  Cardiology, Radiology Why:  our office will call you with an appointment to pick up a monitor and follow-up with [name] information: [address] [place] [place] [address] [phone]          Discharge Instructions    Diet - low sodium heart healthy   Complete by:  As directed    Diet - low sodium heart healthy   Complete by:  As directed    Increase activity slowly   Complete by:  As directed    Increase activity slowly   Complete by:  As directed       Discharge Medications   Allergies as of 05/02/2018      Reactions   Adhesive [tape] Rash   Latex Rash    Allergies as of 05/02/2018      Reactions   Adhesive [tape] Rash   Latex Rash      Medication List    STOP taking these medications   metoprolol tartrate 25 MG tablet Commonly known as:  LOPRESSOR     TAKE these medications   acyclovir 400 MG tablet Commonly known as:  ZOVIRAX Take 1 tablet (400 mg total) by mouth 5 (five) times daily. What changed:  additional instructions   aspirin EC 81 MG tablet Take 81 mg by mouth at bedtime.   DRAMAMINE 50 MG tablet Generic drug:  dimenhyDRINATE Take 50 mg by mouth every 8 (eight) hours as needed (  vertigo).   esomeprazole 40 MG capsule Commonly known as:  NEXIUM Take 40 mg by mouth daily as needed (heart burn).   ezetimibe 10 MG tablet Commonly known as:  ZETIA TAKE 1 TABLET (10 MG TOTAL) BY MOUTH DAILY.   Fenofibric Acid 45 MG Cpdr TAKE 1 CAPSULE (45MG ) BY MOUTH AT BEDTIME What changed:  See the new instructions.   fluticasone 50 MCG/ACT nasal spray Commonly known as:  FLONASE Place 2 sprays into both nostrils daily.   furosemide 20 MG tablet Commonly known as:  LASIX Take 10 mg by mouth daily.   hydrochlorothiazide 25 MG tablet Commonly known as:  HYDRODIURIL Take 1 tablet (25 mg total) by mouth daily.   nitroGLYCERIN 0.4 MG SL tablet Commonly known as:  NITROSTAT Place 1 tablet (0.4 mg total) under the tongue every 5 (five) minutes as needed for chest pain.   simvastatin 80 MG tablet Commonly known as:  ZOCOR TAKE 1 TABLET (80 MG TOTAL) BY MOUTH DAILY AT 6 PM.        Acute coronary syndrome (MI, NSTEMI, STEMI, etc) this admission?: No.    Outstanding Labs/Studies   7 day event monitor pending   Duration of Discharge Encounter   Greater than 30 minutes including physician time.  Signed, Lyda Jester, PA-C 05/02/2018, 11:39 AM

## 2018-05-02 NOTE — Progress Notes (Signed)
Progress Note  Patient Name: [name] Date of Encounter: 05/02/2018  Primary Cardiologist: Quay Burow, MD   Subjective   Feeling well.  No more dizziness.  Nausea has improved significantly.  He ambulated several times without lightheadedness or dizziness.  Inpatient Medications    Scheduled Meds: . aspirin EC  81 mg Oral QHS  . atorvastatin  40 mg Oral q1800  . ezetimibe  10 mg Oral Daily  . fenofibrate  54 mg Oral Daily  . furosemide  10 mg Oral Daily   Continuous Infusions:  PRN Meds: dimenhyDRINATE   Vital Signs    Vitals:   05/01/18 1818 05/01/18 1953 05/02/18 0005 05/02/18 0434  BP:  (!) 160/57 134/68 129/62  Pulse:  64 61 (!) 58  Resp:  20 20 20   Temp:  98.4 F (36.9 C) 98 F (36.7 C) (!) 97.4 F (36.3 C)  TempSrc:  Oral Oral Oral  SpO2:  99% 97% 97%  Weight: 72.2 kg   71.2 kg  Height: 5\' 9"  (1.753 m)       Intake/Output Summary (Last 24 hours) at 05/02/2018 1053 Last data filed at 05/02/2018 0846 Gross per 24 hour  Intake 120 ml  Output 800 ml  Net -680 ml   Filed Weights   05/01/18 1818 05/02/18 0434  Weight: 72.2 kg 71.2 kg    Telemetry    Sinus rhythm/sinus arrhythmia.  Rate 50s to 80s.- Personally Reviewed  ECG    N/A- Personally Reviewed  Physical Exam   VS:  BP 129/62 (BP Location: Right Arm)   Pulse (!) 58   Temp (!) 97.4 F (36.3 C) (Oral)   Resp 20   Ht 5\' 9"  (1.753 m)   Wt 71.2 kg   SpO2 97%   BMI 23.18 kg/m   , BMI Body mass index is 23.18 kg/m. GENERAL:  Well appearing HEENT: Pupils equal round and reactive, fundi not visualized, oral mucosa unremarkable NECK:  No jugular venous distention, waveform within normal limits, carotid upstroke brisk and symmetric, no bruits LUNGS:  Clear to auscultation bilaterally HEART:  RRR.  PMI not displaced or sustained,S1 and S2 within normal limits, no S3, no S4, no clicks, no rubs, no murmurs ABD:  Flat, positive bowel sounds normal in frequency in pitch, no  bruits, no rebound, no guarding, no midline pulsatile mass, no hepatomegaly, no splenomegaly EXT:  2 plus pulses throughout, no edema, no cyanosis no clubbing SKIN:  No rashes no nodules NEURO:  Cranial nerves II through XII grossly intact, motor grossly intact throughout [facility]:  Cognitively intact, oriented to person place and time   Labs    Chemistry Recent Labs  Lab 05/01/18 0917 05/02/18 0422  NA 137 137  K 3.8 4.2  CL 105 106  CO2 22 23  GLUCOSE 149* 99  BUN 19 17  CREATININE 1.58* 1.88*  CALCIUM 8.7* 8.7*  GFRNONAA 41* 33*  GFRAA 47* 38*  ANIONGAP 10 8     Hematology Recent Labs  Lab 05/01/18 0917  WBC 9.0  RBC 5.57  HGB 16.7  HCT 49.6  MCV 89.0  MCH 30.0  MCHC 33.7  RDW 13.1  PLT 155    Cardiac Enzymes Recent Labs  Lab 05/01/18 1253  TROPONINI <0.03   No results for input(s): TROPIPOC in the last 168 hours.   BNPNo results for input(s): BNP, PROBNP in the last 168 hours.   DDimer No results for input(s): DDIMER in the last 168 hours.  Radiology    No results found.  Cardiac Studies   Carotid Doppler 05/2017: Mild R ICA stenosis.  Moderate L ICA stenosis.  LHC 10/28/10  CONCLUSION: 1. Significant left main and 3-vessel coronary artery disease. 2. Patent lumen to left anterior descending with 50% lesion in the left anterior descending beyond the OM insertion. 3. Patent vein graft to the diagonal. 4. Patent but diffusely diseased vein graft to the obtuse marginal. 5. Total occluded vein graft to the right coronary artery. 6. Normal left ventricular systolic function. 7. No evidence of ascending aortic dissection or dilatation.  2D Echo 05/2005 SUMMARY - Overall left ventricular systolic function was normal. Left    ventricular ejection fraction was estimated to be 55 %. - Aortic valve thickness was mildly increased. IMPRESSIONS - If clinically indicated, transesophageal echocardiography could     provide additional information.  Myocardial Perfusion Study 01/2013 Impression Exercise Capacity: Lexiscan with no exercise. BP Response: Normal blood pressure response. Clinical Symptoms: No significant symptoms noted. ECG Impression: No significant ST segment change suggestive of ischemia. Comparison with Prior Nuclear Study: A similar inferolateral defect is seen on the 2012 study  Overall Impression: Low risk stress nuclear study with possible small inferolateral infarct..  LV Wall Motion: the study could not be gated.  Patient Profile     Mr. Digirolamo is a 54M with CAD s/p CABG, PAD, carotid stenosis, hypertension, hyperlipidemia, renal artery stenosis status post stenting, CKD 3, and vertigo here with an acute episode of vertigo.   Assessment & Plan    #Dizziness: #Vertigo: #Bradycardia: Mr. Donson is feeling much better today.  He is no longer feeling vertiginous.  I suspect that this episode was a perfect storm of vertigo, nausea with increased vagal tone, and taking extra metoprolol.  He has not received any more metoprolol and his heart rates have been in the 50s to 80s.  There have been no more pauses.  Yesterday at [place] long he had a 4.7-second pause. There were 3 dropped beats.  He is feeling well and has ambulated without difficulty.  We will plan to have him get a 7-day event monitor.  Continue to hold metoprolol for now.  He has known carotid stenosis and is scheduled for repeat carotid Dopplers next month.  This episode seems to be more related to vertigo and bradycardia then carotid stenosis.  # CAD s/p CABG: # Hyperlipidemia:  Troponin remains less than 0.03.  EKG was without ischemic changes.  Holding beta-blocker as above.  Continue home aspirin, atorvastatin, fenofibrate, and Zetia.  # CKD 3:  Renal function stable  For questions or updates, please contact [place] Please consult [email or web address] for contact info under        Signed, Skeet Latch, MD  05/02/2018, 10:53 AM

## 2018-05-02 NOTE — Progress Notes (Signed)
Patient ready for discharge. 

## 2018-05-02 NOTE — Progress Notes (Addendum)
Received call from bedside RN potential code 59; UR notified; B Pennie Rushing [phone]  12:39 pm: Received call from [facility]; case changed to Observation; Alesia RNCM made aware of Code 28 notice to be given; B Tamera Punt RN,MHA,BSN

## 2018-05-02 NOTE — Progress Notes (Signed)
Order for 7 day event monitor placed

## 2018-05-02 NOTE — Plan of Care (Signed)
  Problem: Education: Goal: Knowledge of General Education information will improve Description Including pain rating scale, medication(s)/side effects and non-pharmacologic comfort measures Outcome: Progressing   Problem: Health Behavior/Discharge Planning: Goal: Ability to manage health-related needs will improve Outcome: Progressing   

## 2018-05-02 NOTE — Care Management CC44 (Signed)
Condition Code 44 Documentation Completed  Patient Details  Name: [name] MRN: [id number] Date of Birth: 1941-01-30   Condition Code 44 given:  Yes Patient signature on Condition Code 44 notice:  Yes Documentation of 2 MD's agreement:  Yes Code 44 added to claim:  Yes    Erenest Rasher, RN 05/02/2018, 1:23 PM

## 2018-05-03 ENCOUNTER — Telehealth: Payer: Self-pay

## 2018-05-03 NOTE — Telephone Encounter (Signed)
Triage staff message received from [place], [place]  Call and spoke with Smith Robert, pcc @ [place] event monitor was scheduled for 11/27. Asked Ivin Booty to contact the pt and move his appt up.  Pt event monitor rescheduled for 05/04/18 8:30am 2 wk APP appt scheduled with [place] 12/4 @ 10:30am.  Pt made aware of appts.

## 2018-05-03 NOTE — Telephone Encounter (Signed)
-----   Message from Nuala Alpha, LPN sent at 41/93/7902  8:15 AM EST ----- Regarding: FW: Event Monitor   ----- Message ----- From: Consuelo Pandy, PA-C Sent: 05/02/2018  11:32 AM EST To: Jennefer Bravo, Benita Stabile, # Subject: Event Monitor                                  Pt is being discharged today and needs a 7 day event monitor for bradycardia and a sinus pause of 4.5 sec. I tried calling but could not get [place] or [place]. I have placed order and would like to try to get later today or tomorrow. Please call to arrange. Will also need f/u with APP in 2 weeks. Primary cardiologist is Dr. Gwenlyn Found.

## 2018-05-04 ENCOUNTER — Other Ambulatory Visit: Payer: Self-pay | Admitting: Cardiology

## 2018-05-04 ENCOUNTER — Ambulatory Visit (INDEPENDENT_AMBULATORY_CARE_PROVIDER_SITE_OTHER): Payer: Medicare Other

## 2018-05-04 DIAGNOSIS — R001 Bradycardia, unspecified: Secondary | ICD-10-CM

## 2018-05-04 DIAGNOSIS — I455 Other specified heart block: Secondary | ICD-10-CM | POA: Diagnosis not present

## 2018-05-11 DIAGNOSIS — R001 Bradycardia, unspecified: Secondary | ICD-10-CM | POA: Diagnosis not present

## 2018-05-13 ENCOUNTER — Other Ambulatory Visit: Payer: Self-pay | Admitting: Cardiovascular Disease

## 2018-05-13 DIAGNOSIS — I10 Essential (primary) hypertension: Secondary | ICD-10-CM

## 2018-05-13 DIAGNOSIS — I739 Peripheral vascular disease, unspecified: Secondary | ICD-10-CM

## 2018-05-16 ENCOUNTER — Ambulatory Visit (INDEPENDENT_AMBULATORY_CARE_PROVIDER_SITE_OTHER): Payer: Medicare Other | Admitting: Emergency Medicine

## 2018-05-16 ENCOUNTER — Other Ambulatory Visit: Payer: Self-pay

## 2018-05-16 ENCOUNTER — Encounter: Payer: Self-pay | Admitting: Emergency Medicine

## 2018-05-16 VITALS — BP 134/72 | HR 81 | Temp 98.7°F | Resp 16 | Ht 66.0 in | Wt 161.8 lb

## 2018-05-16 DIAGNOSIS — R42 Dizziness and giddiness: Secondary | ICD-10-CM | POA: Diagnosis not present

## 2018-05-16 DIAGNOSIS — I701 Atherosclerosis of renal artery: Secondary | ICD-10-CM | POA: Diagnosis not present

## 2018-05-16 DIAGNOSIS — I739 Peripheral vascular disease, unspecified: Secondary | ICD-10-CM | POA: Diagnosis not present

## 2018-05-16 DIAGNOSIS — I10 Essential (primary) hypertension: Secondary | ICD-10-CM

## 2018-05-16 DIAGNOSIS — E7800 Pure hypercholesterolemia, unspecified: Secondary | ICD-10-CM

## 2018-05-16 NOTE — Progress Notes (Signed)
Cardiology Office Note   Date:  05/17/2018   ID:  [name], DOB January 12, 1941, MRN [id number]  PCP:  [name], MD  Cardiologist: [facility]  Chief Complaint  Patient presents with  . Hospitalization Follow-up  . Coronary Artery Disease     History of Present Illness: [name] is a 77 y.o. male who presents for post hospitalization follow up after admission for dizziness and vomiting with associated vertigo. In ER he was found to be bradycardic with rates in the 40's to 60's and sinus pause of 4.5 seconds. He was on metoprolol 12.5 mg at the time, but accidentally took an additional 12.5 as he didn't remember if he took it.      He has a history of CAD and PVOD, with CABG 2006, with LIMA to LAD,SVG to RCA, CX and diagonal branches. He had PAF post operatively. He has PVOD status post right carotid endarterectomy performed by Dr. Doren Custard, and also has aortoiliac and left SFA disease. Other history of HTN and dyslipidemia. Repeat cardiac cath in 2012 revealed patent grafts and moderate aortoiliac disease.   During hospitalization he was ruled out for ACS, TSH was normal. Telemetry did not find any further pauses with HR 50-80's. It was suspected that his symptoms were related to BB overdose. He was planned for a 7 day cardiac monitor and discharged on 05/02/2018.   He has not taken the metoprolol 12.5 mg since discharge. He has had the 7 day monitor completed with average HR of 72 bpm, lowest HR of 60, and highest of 120. He states that the box keep beeping the last couple of days and he would tap on it to make it stop.  He has had no further symptoms.   Past Medical History:  Diagnosis Date  . Anemia, unspecified   . Basal cell carcinoma (BCC) of right forearm    [name]  . Benign paroxysmal positional vertigo   . Blood transfusion without reported diagnosis   . CAD (coronary artery disease)    HX CABG 2006 X 4; last nuc 11/2010 last cath 2012-wth 95% OM too small for  intervention, treated medically   . Carcinoma in situ of skin, site unspecified   . Cerebrovascular disease, unspecified    hx CEA-RT; KNOWN AOTOILIAC 7 LT sfa disease  . Chronic renal insufficiency    glomerulonephritis 3086578.4  . Congenital anomaly of tongue, unspecified   . Esophageal reflux   . Essential hypertension, benign   . Genital herpes, unspecified   . History of stress test 06/2010  . Hx of echocardiogram 01/2005   EF 55% normal study  . Lung mass   . PAF (paroxysmal atrial fibrillation) ([place])    post CABG, maintaining SR  . Peripheral vascular disease, unspecified ([place])   . Pure hypercholesterolemia     Past Surgical History:  Procedure Laterality Date  . CARDIAC CATHETERIZATION  06/2010  . CAROTID ENDARTERECTOMY  2007   Right  . CHOLECYSTECTOMY  07/2010  . CORONARY ARTERY BYPASS GRAFT  05/12/2005   LIMA-LAD, VG-DIAG; VG-LCX,OM; VG-RCA  . IR RADIOLOGIST EVAL & MGMT  07/13/2017  . IR RADIOLOGIST EVAL & MGMT  09/20/2017  . IR RENAL BILAT S&I MOD SED  08/09/2017  . IR TRANSCATH PLC STENT 1ST ART NOT LE CV CAR VERT CAR  08/09/2017  . IR US GUIDE VASC ACCESS RIGHT  08/09/2017     Current Outpatient Medications  Medication Sig Dispense Refill  . acyclovir (ZOVIRAX) 400 MG  tablet Take 1 tablet (400 mg total) by mouth 5 (five) times daily. (Patient taking differently: Take 400 mg by mouth 5 (five) times daily. Takes as needed for herpes) 30 tablet 2  . aspirin EC 81 MG tablet Take 81 mg by mouth at bedtime.    . Choline Fenofibrate (FENOFIBRIC ACID) 45 MG CPDR TAKE 1 CAPSULE (45MG ) BY MOUTH AT BEDTIME 90 capsule 3  . dimenhyDRINATE (DRAMAMINE) 50 MG tablet Take 50 mg by mouth every 8 (eight) hours as needed (vertigo).    [name] esomeprazole (NEXIUM) 40 MG capsule Take 40 mg by mouth daily as needed (heart burn).     . ezetimibe (ZETIA) 10 MG tablet Take 1 tablet (10 mg total) by mouth daily. 90 tablet 3  . furosemide (LASIX) 20 MG tablet Take 10 mg by mouth daily.    .  nitroGLYCERIN (NITROSTAT) 0.4 MG SL tablet Place 1 tablet (0.4 mg total) under the tongue every 5 (five) minutes as needed for chest pain. 25 tablet 3  . simvastatin (ZOCOR) 80 MG tablet TAKE 1 TABLET (80 MG TOTAL) BY MOUTH DAILY AT 6 PM. 90 tablet 0  . fluticasone (FLONASE) 50 MCG/ACT nasal spray Place 2 sprays into both nostrils daily. (Patient not taking: Reported on 05/16/2018) 16 g 6  . hydrochlorothiazide (HYDRODIURIL) 25 MG tablet Take 1 tablet (25 mg total) by mouth daily. (Patient not taking: Reported on 03/21/2018) 30 tablet 6   No current facility-administered medications for this visit.     Allergies:   Adhesive [tape] and Latex    Social History:  The patient  reports that he has never smoked. He has never used smokeless tobacco. He reports that he drinks alcohol. He reports that he does not use drugs.   Family History:  The patient's family history includes Cancer (age of onset: 3) in his brother; Diabetes in his mother.    ROS: All other systems are reviewed and negative. Unless otherwise mentioned in H&P    PHYSICAL EXAM: VS:  BP 116/68   Pulse 66   Wt 165 lb (74.8 kg)   SpO2 96%   BMI 26.63 kg/m  , BMI Body mass index is 26.63 kg/m. GEN: Well nourished, well developed, in no acute distress HEENT: normal Neck: no JVD, carotid bruits, or masses Cardiac: RRR; no murmurs, rubs, or gallops,no edema  Respiratory:  Clear to auscultation bilaterally, normal work of breathing GI: soft, nontender, nondistended, + BS MS: no deformity or atrophy Skin: warm and dry, no rash Neuro:  Strength and sensation are intact Psych: euthymic mood, full affect   EKG:  Not completed this office visit.   Recent Labs: 05/19/2017: ALT 23 05/01/2018: Hemoglobin 16.7; Platelets 155; TSH 1.052 05/02/2018: BUN 17; Creatinine, Ser 1.88; Potassium 4.2; Sodium 137    Lipid Panel    Component Value Date/Time   CHOL 133 03/16/2017 1443   TRIG 141 03/16/2017 1443   HDL 41 03/16/2017 1443     CHOLHDL 3.2 03/16/2017 1443   CHOLHDL 3.0 12/19/2013 0953   VLDL 28 12/19/2013 0953   LDLCALC 64 03/16/2017 1443      Wt Readings from Last 3 Encounters:  05/17/18 165 lb (74.8 kg)  05/16/18 161 lb 12.8 oz (73.4 kg)  05/02/18 157 lb (71.2 kg)    Other studies Reviewed: Echocardiogram 05-23-2005 SUMMARY - Overall left ventricular systolic function was normal. Left    ventricular ejection fraction was estimated to be 55 %. - Aortic valve thickness was mildly increased. IMPRESSIONS -  If clinically indicated, transesophageal echocardiography could  NM stress test: 07/03/2017:   1.  Findings concerning for moderate ischemia of the lateral wall and inferolateral wall.   2.  Mild septal hypokinesia.  Normal contractility.   3.  Left ventricular ejection fraction equal 72%  ASSESSMENT AND PLAN:  1.  Bradycardia: Accidental overdose of metoprolol, taking an extra dose as he thought he may not have taken it that morning. He has been taken off of metoprolol for now. He brings with him a copy of BP and HR. Cardiac monitor average HR 72. For now, I will keep him off of BB. However, he will continue to take his BP and HR recordings. If he persistently has HR >90 he will restart the metoprolol at 12.5 mg daily. He is to call us if he does so to keep [place] informed. He will see Dr. Gwenlyn Found on follow up on previously scheduled appointment in January 2020.   2. CAD: Hx of CABG 2006, with LIMA to LAD,SVG to RCA, CX and diagonal branches. Continue secondary management. Normally would keep CAD patient on BB, but he has periods of dizziness. Will monitor this more closely on follow up.  3. Hypercholesterolemia: He is on simvastatin 80 mg daily. Goal of LDL < 70.  On last blood draw on 03/16/2017 LDL was 64. Sees PCP for labs.   4.Carotid Artery Disease: Has follow up carotid study planned for 06/12/2018.    Current medicines are reviewed at length with the patient today.    Labs/ tests  ordered today include: None  Phill Myron. West Pugh, ANP, AACC   05/17/2018 11:43 AM    [address] Office [phone] Fax [phone]

## 2018-05-16 NOTE — Patient Instructions (Addendum)
     If you have lab work done today you will be contacted with your lab results within the next 2 weeks.  If you have not heard from [place] then please contact us. The fastest way to get your results is to register for My Chart.   IF you received an x-ray today, you will receive an invoice from [facility]. Please contact [facility] at [phone] with questions or concerns regarding your invoice.   IF you received labwork today, you will receive an invoice from [place]. Please contact [facility] at [phone] with questions or concerns regarding your invoice.   Our billing staff will not be able to assist you with questions regarding bills from these companies.  You will be contacted with the lab results as soon as they are available. The fastest way to get your results is to activate your My Chart account. Instructions are located on the last page of this paperwork. If you have not heard from [place] regarding the results in 2 weeks, please contact this office.     Vertigo Vertigo means that you feel like you are moving when you are not. Vertigo can also make you feel like things around you are moving when they are not. This feeling can come and go at any time. Vertigo often goes away on its own. Follow these instructions at home:  Avoid making fast movements.  Avoid driving.  Avoid using heavy machinery.  Avoid doing any task or activity that might cause danger to you or other people if you would have a vertigo attack while you are doing it.  Sit down right away if you feel dizzy or have trouble with your balance.  Take over-the-counter and prescription medicines only as told by your doctor.  Follow instructions from your doctor about which positions or movements you should avoid.  Drink enough fluid to keep your pee (urine) clear or pale yellow.  Keep all follow-up visits as told by your doctor. This is important. Contact a doctor if:  Medicine does not help  your vertigo.  You have a fever.  Your problems get worse or you have new symptoms.  Your family or friends see changes in your behavior.  You feel sick to your stomach (nauseous) or you throw up (vomit).  You have a "pins and needles" feeling or you are numb in part of your body. Get help right away if:  You have trouble moving or talking.  You are always dizzy.  You pass out (faint).  You get very bad headaches.  You feel weak or have trouble using your hands, arms, or legs.  You have changes in your hearing.  You have changes in your seeing (vision).  You get a stiff neck.  Bright light starts to bother you. This information is not intended to replace advice given to you by your health care provider. Make sure you discuss any questions you have with your health care provider. Document Released: 03/09/2008 Document Revised: 11/06/2015 Document Reviewed: 09/23/2014 Elsevier Interactive Patient Education  [employer].

## 2018-05-16 NOTE — Progress Notes (Signed)
[name] 77 y.o.   Chief Complaint  Patient presents with  . Dizziness    follow up 05/01/2018    HISTORY OF PRESENT ILLNESS: This is a 77 y.o. male first visit with me, here for hospital follow-up visit.  Was admitted on 05/01/2018 with a severe vertigo episode.  Patient has a history of chronic recurrent dizzy spells.  Was found to be bradycardic more than usual.  Was taken off metoprolol.  Presently only taking aspirin and simvastatin.  Scheduled to see cardiologist tomorrow.  Today has no complaints or medical concerns.  No new symptoms.  Work-up so far has been negative.  HPI   Prior to Admission medications   Medication Sig Start Date End Date Taking? Authorizing Provider  acyclovir (ZOVIRAX) 400 MG tablet Take 1 tablet (400 mg total) by mouth 5 (five) times daily. Patient taking differently: Take 400 mg by mouth 5 (five) times daily. Takes as needed for herpes 03/16/17  Yes [name], MD  aspirin EC 81 MG tablet Take 81 mg by mouth at bedtime.   Yes [provider]  Choline Fenofibrate (FENOFIBRIC ACID) 45 MG CPDR TAKE 1 CAPSULE (45MG ) BY MOUTH AT BEDTIME Patient taking differently: Take 45 mg by mouth at bedtime.  06/09/17  Yes [name], MD  dimenhyDRINATE (DRAMAMINE) 50 MG tablet Take 50 mg by mouth every 8 (eight) hours as needed (vertigo).   Yes [provider]  esomeprazole (NEXIUM) 40 MG capsule Take 40 mg by mouth daily as needed (heart burn).    Yes [provider]  ezetimibe (ZETIA) 10 MG tablet TAKE 1 TABLET BY MOUTH DAILY 05/16/18  Yes [name], MD  furosemide (LASIX) 20 MG tablet Take 10 mg by mouth daily.   Yes [provider]  nitroGLYCERIN (NITROSTAT) 0.4 MG SL tablet Place 1 tablet (0.4 mg total) under the tongue every 5 (five) minutes as needed for chest pain. 03/16/17  Yes [name], MD  simvastatin (ZOCOR) 80 MG tablet TAKE 1 TABLET (80 MG TOTAL) BY MOUTH DAILY AT 6 PM. 03/27/18  Yes [name], MD  fluticasone [facility]) 50 MCG/ACT nasal spray Place 2 sprays into both nostrils daily. Patient not taking: Reported on 05/16/2018 03/21/18   Forrest Moron, MD  hydrochlorothiazide (HYDRODIURIL) 25 MG tablet Take 1 tablet (25 mg total) by mouth daily. Patient not taking: Reported on 03/21/2018 06/03/17   [name], MD    Allergies  Allergen Reactions  . Adhesive [Tape] Rash  . Latex Rash    Patient Active Problem List   Diagnosis Date Noted  . Vertigo   . Resting tremor 03/21/2018  . Tinnitus 08/30/2017  . Conductive hearing loss of both ears 05/30/2017  . Family history of colon cancer 03/22/2017  . Acute URI 10/26/2016  . Peripheral arterial disease ([place]) 03/07/2013  . Bradycardia 02/05/2013  . 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 CAD in native artery 02/05/2013  . Essential hypertension 09/04/2012  . Pure hypercholesterolemia 09/04/2012  . Abdominal pain, epigastric 09/04/2012  . Liver function test abnormality 09/04/2012  . Coronary artery disease 09/04/2012  . Occlusion and stenosis of carotid artery 09/04/2012  . HYPERLIPIDEMIA 11/24/2009  . Disorder of kidney and ureter 11/24/2009  . CT, CHEST, ABNORMAL 11/24/2009    Past Medical History:  Diagnosis Date  . Anemia, unspecified   . Basal cell carcinoma (BCC) of right forearm    [name]  . Benign paroxysmal positional vertigo   .  Blood transfusion without reported diagnosis   . CAD (coronary artery disease)    HX CABG 2006 X 4; last nuc 11/2010 last cath 2012-wth 95% OM too small for intervention, treated medically   . Carcinoma in situ of skin, site unspecified   . Cerebrovascular disease, unspecified    hx CEA-RT; KNOWN AOTOILIAC 7 LT sfa disease  . Chronic renal insufficiency    glomerulonephritis [id number]  . Congenital anomaly of tongue, unspecified   . Esophageal reflux   . Essential hypertension, benign   . Genital herpes, unspecified   . History of stress test 06/2010  . Hx  of echocardiogram 01/2005   EF 55% normal study  . Lung mass   . PAF (paroxysmal atrial fibrillation) ([place])    post CABG, maintaining SR  . Peripheral vascular disease, unspecified ([place])   . Pure hypercholesterolemia     Past Surgical History:  Procedure Laterality Date  . CARDIAC CATHETERIZATION  06/2010  . CAROTID ENDARTERECTOMY  2007   Right  . CHOLECYSTECTOMY  07/2010  . CORONARY ARTERY BYPASS GRAFT  05/12/2005   LIMA-LAD, VG-DIAG; VG-LCX,OM; VG-RCA  . IR RADIOLOGIST EVAL & MGMT  07/13/2017  . IR RADIOLOGIST EVAL & MGMT  09/20/2017  . IR RENAL BILAT S&I MOD SED  08/09/2017  . IR TRANSCATH PLC STENT 1ST ART NOT LE CV CAR VERT CAR  08/09/2017  . IR US GUIDE VASC ACCESS RIGHT  08/09/2017    Social History   Socioeconomic History  . Marital status: Married    Spouse name: [name]  . Number of children: 0  . Years of education: Not on file  . Highest education level: Not on file  Occupational History  . Occupation: Retired    Comment: Theme park manager: RETIRED  Social Needs  . Financial resource strain: Not on file  . Food insecurity:    Worry: Not on file    Inability: Not on file  . Transportation needs:    Medical: Not on file    Non-medical: Not on file  Tobacco Use  . Smoking status: Never Smoker  . Smokeless tobacco: Never Used  Substance and Sexual Activity  . Alcohol use: Yes    Alcohol/week: 0.0 standard drinks    Comment: socially  . Drug use: No  . Sexual activity: Yes  Lifestyle  . Physical activity:    Days per week: Not on file    Minutes per session: Not on file  . Stress: Not on file  Relationships  . Social connections:    Talks on phone: Not on file    Gets together: Not on file    Attends religious service: Not on file    Active member of club or organization: Not on file    Attends meetings of clubs or organizations: Not on file    Relationship status: Not on file  . Intimate partner violence:    Fear of current or ex partner: Not on  file    Emotionally abused: Not on file    Physically abused: Not on file    Forced sexual activity: Not on file  Other Topics Concern  . Not on file  Social History Narrative   Last name is pronounced "cas ka" No caffeine use. Always uses seat belts. Exercise:Moderate walks 30 min daily and rides a bike. Uses treadmill 20 mins everyday. Smoke alarm in home, No guns in the home.      Marital status:  Married x 12 years: happily  married. Most of family in [place].      Children; none      Lives: with wife      Employment: retired.      Tobacco; never       Alcohol: wine after lunch daily.      Exercise:  Treadmill 20 minutes per day seven days per week.  Also walking throughout the day.      Advanced Directives: YES: FULL CODE no prolonged measures.      ADLs:  Independent with ADLs; no assistant devices.  Drives.     Family History  Problem Relation Age of Onset  . Diabetes Mother   . Cancer Brother 14       colon cancer     Review of Systems  Constitutional: Negative.  Negative for chills and fever.  HENT: Negative.   Eyes: Negative.  Negative for blurred vision and double vision.  Respiratory: Negative.  Negative for cough and shortness of breath.   Cardiovascular: Negative.  Negative for chest pain and palpitations.  Gastrointestinal: Negative.  Negative for abdominal pain, diarrhea, nausea and vomiting.  Genitourinary: Negative.  Negative for dysuria.  Musculoskeletal: Negative.  Negative for back pain, myalgias and neck pain.  Skin: Negative.  Negative for rash.  Neurological: Positive for dizziness. Negative for headaches.  Endo/Heme/Allergies: Negative.   All other systems reviewed and are negative.   Vitals:   05/16/18 1334  BP: 134/72  Pulse: 81  Resp: 16  Temp: 98.7 F (37.1 C)  SpO2: 97%    Physical Exam  Constitutional: He is oriented to person, place, and time. He appears well-developed and well-nourished.  HENT:  Head: Normocephalic and  atraumatic.  Nose: Nose normal.  Mouth/Throat: Oropharynx is clear and moist.  Eyes: Pupils are equal, round, and reactive to light. Conjunctivae and EOM are normal.  Neck: Normal range of motion. Neck supple.  Cardiovascular: Normal rate, regular rhythm and normal heart sounds.  Pulmonary/Chest: Effort normal and breath sounds normal.  Abdominal: Soft. Bowel sounds are normal. He exhibits no distension. There is no tenderness.  Musculoskeletal: Normal range of motion. He exhibits no edema.  Lymphadenopathy:    He has no cervical adenopathy.  Neurological: He is alert and oriented to person, place, and time. No sensory deficit. He exhibits normal muscle tone.  Skin: Skin is warm and dry. Capillary refill takes less than 2 seconds.  Psychiatric: He has a normal mood and affect. His behavior is normal.  Vitals reviewed.    ASSESSMENT & PLAN: Keegen was seen today for dizziness.  Diagnoses and all orders for this visit:  Dizziness  Essential hypertension, benign  Peripheral arterial disease ([place])  Pure hypercholesterolemia    Patient Instructions       If you have lab work done today you will be contacted with your lab results within the next 2 weeks.  If you have not heard from [place] then please contact us. The fastest way to get your results is to register for My Chart.   IF you received an x-ray today, you will receive an invoice from [facility]. Please contact [facility] at [phone] with questions or concerns regarding your invoice.   IF you received labwork today, you will receive an invoice from [place]. Please contact [facility] at [phone] with questions or concerns regarding your invoice.   Our billing staff will not be able to assist you with questions regarding bills from these companies.  You will be contacted with the lab results  as soon as they are available. The fastest way to get your results is to activate your My Chart account.  Instructions are located on the last page of this paperwork. If you have not heard from [place] regarding the results in 2 weeks, please contact this office.     Vertigo Vertigo means that you feel like you are moving when you are not. Vertigo can also make you feel like things around you are moving when they are not. This feeling can come and go at any time. Vertigo often goes away on its own. Follow these instructions at home:  Avoid making fast movements.  Avoid driving.  Avoid using heavy machinery.  Avoid doing any task or activity that might cause danger to you or other people if you would have a vertigo attack while you are doing it.  Sit down right away if you feel dizzy or have trouble with your balance.  Take over-the-counter and prescription medicines only as told by your doctor.  Follow instructions from your doctor about which positions or movements you should avoid.  Drink enough fluid to keep your pee (urine) clear or pale yellow.  Keep all follow-up visits as told by your doctor. This is important. Contact a doctor if:  Medicine does not help your vertigo.  You have a fever.  Your problems get worse or you have new symptoms.  Your family or friends see changes in your behavior.  You feel sick to your stomach (nauseous) or you throw up (vomit).  You have a "pins and needles" feeling or you are numb in part of your body. Get help right away if:  You have trouble moving or talking.  You are always dizzy.  You pass out (faint).  You get very bad headaches.  You feel weak or have trouble using your hands, arms, or legs.  You have changes in your hearing.  You have changes in your seeing (vision).  You get a stiff neck.  Bright light starts to bother you. This information is not intended to replace advice given to you by your health care provider. Make sure you discuss any questions you have with your health care provider. Document Released: 03/09/2008  Document Revised: 11/06/2015 Document Reviewed: 09/23/2014 Elsevier Interactive Patient Education  2018 [employer].      Agustina Caroli, MD [facility]

## 2018-05-17 ENCOUNTER — Ambulatory Visit (INDEPENDENT_AMBULATORY_CARE_PROVIDER_SITE_OTHER): Payer: Medicare Other | Admitting: Adult Health

## 2018-05-17 ENCOUNTER — Encounter: Payer: Self-pay | Admitting: Adult Health

## 2018-05-17 VITALS — BP 116/68 | HR 66 | Wt 165.0 lb

## 2018-05-17 DIAGNOSIS — I2510 Atherosclerotic heart disease of native coronary artery without angina pectoris: Secondary | ICD-10-CM

## 2018-05-17 DIAGNOSIS — I10 Essential (primary) hypertension: Secondary | ICD-10-CM

## 2018-05-17 DIAGNOSIS — I701 Atherosclerosis of renal artery: Secondary | ICD-10-CM

## 2018-05-17 DIAGNOSIS — R001 Bradycardia, unspecified: Secondary | ICD-10-CM | POA: Diagnosis not present

## 2018-05-17 DIAGNOSIS — I739 Peripheral vascular disease, unspecified: Secondary | ICD-10-CM

## 2018-05-17 MED ORDER — FENOFIBRIC ACID 45 MG PO CPDR: DELAYED_RELEASE_CAPSULE | ORAL | 3 refills | Status: DC

## 2018-05-17 MED ORDER — EZETIMIBE 10 MG PO TABS: 10.0000 mg | ORAL_TABLET | Freq: Every day | ORAL | 3 refills | Status: DC

## 2018-05-17 NOTE — Patient Instructions (Signed)
Medication Instructions:  NO CHANGES- Your physician recommends that you continue on your current medications as directed. Please refer to the Current Medication list given to you today. If you need a refill on your cardiac medications before your next appointment, please call your pharmacy. Labwork: If you have labs (blood work) drawn today and your tests are completely normal, you will receive your results only by: [name] MyChart Message (if you have MyChart) OR . A paper copy in the mail If you have any lab test that is abnormal or we need to change your treatment, we will call you to review the results.  Special Instructions: KEEP TRACK OF BP AND HR AND IF IT CONTINUES TO BE >90'S RE-START METOPROLOL AND CALL AND LET us KNOW  Follow-Up: You will need a follow up appointment in [place].  Your Advanced Practice Providers on your designated Care Team:  Kerin Ransom, [facility], you and your health needs are our priority.  As part of our continuing mission to provide you with exceptional heart care, we have created designated Provider Care Teams.  These Care Teams include your primary Cardiologist (physician) and Advanced Practice Providers (APPs -  Physician Assistants and Nurse Practitioners) who all work together to provide you with the care you need, when you need it.

## 2018-05-18 DIAGNOSIS — N183 Chronic kidney disease, stage 3 (moderate): Secondary | ICD-10-CM | POA: Diagnosis not present

## 2018-05-18 DIAGNOSIS — N189 Chronic kidney disease, unspecified: Secondary | ICD-10-CM | POA: Diagnosis not present

## 2018-05-18 DIAGNOSIS — E785 Hyperlipidemia, unspecified: Secondary | ICD-10-CM | POA: Diagnosis not present

## 2018-05-29 DIAGNOSIS — H6123 Impacted cerumen, bilateral: Secondary | ICD-10-CM | POA: Diagnosis not present

## 2018-06-12 ENCOUNTER — Ambulatory Visit (HOSPITAL_COMMUNITY)
Admission: RE | Admit: 2018-06-12 | Discharge: 2018-06-12 | Disposition: A | Discharge status: Home / Self Care | Payer: Medicare Other | Source: Ambulatory Visit | Attending: Cardiology | Admitting: Cardiology

## 2018-06-12 DIAGNOSIS — I6529 Occlusion and stenosis of unspecified carotid artery: Secondary | ICD-10-CM | POA: Insufficient documentation

## 2018-06-16 ENCOUNTER — Other Ambulatory Visit: Payer: Self-pay | Admitting: *Deleted

## 2018-06-16 DIAGNOSIS — I6529 Occlusion and stenosis of unspecified carotid artery: Secondary | ICD-10-CM

## 2018-06-20 ENCOUNTER — Encounter: Payer: Self-pay | Admitting: Cardiovascular Disease

## 2018-06-20 ENCOUNTER — Ambulatory Visit (INDEPENDENT_AMBULATORY_CARE_PROVIDER_SITE_OTHER): Payer: Medicare Other | Admitting: Cardiovascular Disease

## 2018-06-20 DIAGNOSIS — I10 Essential (primary) hypertension: Secondary | ICD-10-CM

## 2018-06-20 DIAGNOSIS — R001 Bradycardia, unspecified: Secondary | ICD-10-CM | POA: Diagnosis not present

## 2018-06-20 DIAGNOSIS — I2510 Atherosclerotic heart disease of native coronary artery without angina pectoris: Secondary | ICD-10-CM

## 2018-06-20 NOTE — Patient Instructions (Signed)
Medication Instructions:  No changes If you need a refill on your cardiac medications before your next appointment, please call your pharmacy.   Lab work: NONE If you have labs (blood work) drawn today and your tests are completely normal, you will receive your results only by: [name] MyChart Message (if you have MyChart) OR . A paper copy in the mail If you have any lab test that is abnormal or we need to change your treatment, we will call you to review the results.  Testing/Procedures: Your physician has requested that you have a carotid duplex. This test is an ultrasound of the carotid arteries in your neck. It looks at blood flow through these arteries that supply the brain with blood. Allow one hour for this exam. There are no restrictions or special instructions.  January 2021  Follow-Up: At [facility], you and your health needs are our priority.  As part of our continuing mission to provide you with exceptional heart care, we have created designated Provider Care Teams.  These Care Teams include your primary Cardiologist (physician) and Advanced Practice Providers (APPs -  Physician Assistants and Nurse Practitioners) who all work together to provide you with the care you need, when you need it. You will need a follow up appointment in 6 months with Jose Sims, DNP and 12 months with [name].  Please call our office 2 months in advance to schedule each appointment.

## 2018-06-20 NOTE — Assessment & Plan Note (Signed)
History of right carotid endarterectomy in 2007 06/12/2018 revealing a widely patent endarterectomy site with moderate left ICA stenosis.  This will be performed on an annual basis

## 2018-06-20 NOTE — Assessment & Plan Note (Signed)
History of essential hypertension with blood pressure measured today 110/62.  He was on a beta-blocker in the past which was discontinued because of symptomatic bradycardia.  He currently is not on antihypertensive medications.  He does check his vital signs at home on a routine basis.

## 2018-06-20 NOTE — Assessment & Plan Note (Signed)
History of CAD status post bypass grafting in 2006 with a LIMA to his LAD, vein to the RCA, circumflex and diagonal branches.  He was catheterized 07/13/2010 and found a patent grafts with moderate aortoiliac disease.  His Myoview performed 8/14 was nonischemic.  He denies chest pain or shortness of breath.

## 2018-06-20 NOTE — Assessment & Plan Note (Signed)
History of hyperlipidemia on Zetia and statin therapy with lipid profile performed 02/15/2018 revealing a total cholesterol 133, LDL 64 and HDL 36.

## 2018-06-20 NOTE — Assessment & Plan Note (Signed)
History of symptomatic bradycardia with a 4.5-second pause noted on telemetry while recently in the ER for dizziness.  He was on a beta-blocker.  Prior to that which was discontinued.  A recent 7-day event monitor did not show any arrhythmias.  He saw Beckie Busing in the office who suggested that he take low-dose beta-blocker as needed for high heart rate but his heart rates have been in the 60-90 range.

## 2018-06-20 NOTE — Progress Notes (Signed)
06/20/2018 [name]   [birth year]  [id number]  Primary Physician [name], MD Primary Cardiologist: [name] MD FACP, [place], [place], [place]  HPI:  [name] is a 78 y.o.  thin and fit-appearing married Caucasian male with no children, who I last saw in the office  06/03/2017.[name] He has a history of CAD and PVOD. He had coronary artery bypass grafting in 2006 with a LIMA to his LAD, a vein to the RCA, circumflex, and diagonal branches. He did develop paroxysmal atrial fibrillation postoperatively. He has PVOD, status post right carotid endarterectomy performed by Dr. [name], as well as a known aortoiliac and left SFA disease, but he denies claudication except after walking seven or eight blocks in his left calf. His other problems include treated hypertension and dyslipidemia. He had a catheterization July 13, 2010, and found to have patent grafts with moderate aortoiliac disease. He does have moderate renal insufficiency followed by Dr. [name]. He has had laparoscopic cholecystectomy that was uncomplicated in the past.. He did have a negative Myoview stress test 8/14.[name] He does exercise on treadmill for 25 minutes a day with minimal claudication  Since I saw him in the office a year ago he is remained stable.  He did have a left renal artery stent placed by Dr. [name] 2/19 at the request of Dr. [name] .  He was hospitalized 05/01/2018 for 1 day because of dizziness and was found to have some symptomatic bradycardia.  Beta-blocker was discontinued and subsequent event monitor showed no arrhythmias.   Current Meds  Medication Sig  . acyclovir (ZOVIRAX) 400 MG tablet Take 1 tablet (400 mg total) by mouth 5 (five) times daily. (Patient taking differently: Take 400 mg by mouth 5 (five) times daily. Takes as needed for herpes)  . aspirin EC 81 MG tablet Take 81 mg by mouth at bedtime.  . Choline Fenofibrate (FENOFIBRIC ACID) 45 MG CPDR TAKE 1 CAPSULE (45MG )  BY MOUTH AT BEDTIME  . dimenhyDRINATE (DRAMAMINE) 50 MG tablet Take 50 mg by mouth every 8 (eight) hours as needed (vertigo).  [name] esomeprazole (NEXIUM) 40 MG capsule Take 40 mg by mouth daily as needed (heart burn).   . ezetimibe (ZETIA) 10 MG tablet Take 1 tablet (10 mg total) by mouth daily.  . furosemide (LASIX) 20 MG tablet Take 10 mg by mouth daily.  . nitroGLYCERIN (NITROSTAT) 0.4 MG SL tablet Place 1 tablet (0.4 mg total) under the tongue every 5 (five) minutes as needed for chest pain.  . simvastatin (ZOCOR) 80 MG tablet TAKE 1 TABLET (80 MG TOTAL) BY MOUTH DAILY AT 6 PM.     Allergies  Allergen Reactions  . Adhesive [Tape] Rash  . Latex Rash    Social History   Socioeconomic History  . Marital status: Married    Spouse name: [name]  . Number of children: 0  . Years of education: Not on file  . Highest education level: Not on file  Occupational History  . Occupation: Retired    Comment: Theme park manager: RETIRED  Social Needs  . Financial resource strain: Not on file  . Food insecurity:    Worry: Not on file    Inability: Not on file  . Transportation needs:    Medical: Not on file    Non-medical: Not on file  Tobacco Use  . Smoking status: Never Smoker  . Smokeless tobacco: Never Used  Substance and Sexual Activity  . Alcohol  use: Yes    Alcohol/week: 0.0 standard drinks    Comment: socially  . Drug use: No  . Sexual activity: Yes  Lifestyle  . Physical activity:    Days per week: Not on file    Minutes per session: Not on file  . Stress: Not on file  Relationships  . Social connections:    Talks on phone: Not on file    Gets together: Not on file    Attends religious service: Not on file    Active member of club or organization: Not on file    Attends meetings of clubs or organizations: Not on file    Relationship status: Not on file  . Intimate partner violence:    Fear of current or ex partner: Not on file    Emotionally abused: Not on file     Physically abused: Not on file    Forced sexual activity: Not on file  Other Topics Concern  . Not on file  Social History Narrative   Last name is pronounced "cas ka" No caffeine use. Always uses seat belts. Exercise:Moderate walks 30 min daily and rides a bike. Uses treadmill 20 mins everyday. Smoke alarm in home, No guns in the home.      Marital status:  Married x 12 years: happily married. Most of family in [place].      Children; none      Lives: with wife      Employment: retired.      Tobacco; never       Alcohol: wine after lunch daily.      Exercise:  Treadmill 20 minutes per day seven days per week.  Also walking throughout the day.      Advanced Directives: YES: FULL CODE no prolonged measures.      ADLs:  Independent with ADLs; no assistant devices.  Drives.      Review of Systems: General: negative for chills, fever, night sweats or weight changes.  Cardiovascular: negative for chest pain, dyspnea on exertion, edema, orthopnea, palpitations, paroxysmal nocturnal dyspnea or shortness of breath Dermatological: negative for rash Respiratory: negative for cough or wheezing Urologic: negative for hematuria Abdominal: negative for nausea, vomiting, diarrhea, bright red blood per rectum, melena, or hematemesis Neurologic: negative for visual changes, syncope, or dizziness All other systems reviewed and are otherwise negative except as noted above.    Blood pressure 110/62, pulse 68, height 5\' 9"  (1.753 m), weight 166 lb (75.3 kg).  General appearance: alert and no distress Neck: no adenopathy, no carotid bruit, no JVD, supple, symmetrical, trachea midline and thyroid not enlarged, symmetric, no tenderness/mass/nodules Lungs: clear to auscultation bilaterally Heart: regular rate and rhythm, S1, S2 normal, no murmur, click, rub or gallop Extremities: extremities normal, atraumatic, no cyanosis or edema Pulses: 2+ and symmetric Skin: Skin color, texture, turgor normal. No  rashes or lesions Neurologic: Alert and oriented X 3, normal strength and tone. Normal symmetric reflexes. Normal coordination and gait  EKG not performed today  ASSESSMENT AND PLAN:   CAD in native artery History of CAD status post bypass grafting in 2006 with a LIMA to his LAD, vein to the RCA, circumflex and diagonal branches.  He was catheterized 07/13/2010 and found a patent grafts with moderate aortoiliac disease.  His Myoview performed 8/14 was nonischemic.  He denies chest pain or shortness of breath.  HYPERLIPIDEMIA History of hyperlipidemia on Zetia and statin therapy with lipid profile performed 02/15/2018 revealing a total cholesterol 133, LDL 64 and  HDL 36.  Essential hypertension History of essential hypertension with blood pressure measured today 110/62.  He was on a beta-blocker in the past which was discontinued because of symptomatic bradycardia.  He currently is not on antihypertensive medications.  He does check his vital signs at home on a routine basis.  Occlusion and stenosis of carotid artery History of right carotid endarterectomy in 2007 06/12/2018 revealing a widely patent endarterectomy site with moderate left ICA stenosis.  This will be performed on an annual basis  Bradycardia History of symptomatic bradycardia with a 4.5-second pause noted on telemetry while recently in the ER for dizziness.  He was on a beta-blocker.  Prior to that which was discontinued.  A recent 7-day event monitor did not show any arrhythmias.  He saw Beckie Busing in the office who suggested that he take low-dose beta-blocker as needed for high heart rate but his heart rates have been in the 60-90 range.      [name] MD FACP,FACC,FAHA, [facility] 06/20/2018 12:14 PM

## 2018-06-22 ENCOUNTER — Other Ambulatory Visit: Payer: Self-pay | Admitting: Cardiovascular Disease

## 2018-07-11 DIAGNOSIS — D751 Secondary polycythemia: Secondary | ICD-10-CM | POA: Diagnosis not present

## 2018-07-11 DIAGNOSIS — N057 Unspecified nephritic syndrome with diffuse crescentic glomerulonephritis: Secondary | ICD-10-CM | POA: Diagnosis not present

## 2018-07-11 DIAGNOSIS — I7090 Unspecified atherosclerosis: Secondary | ICD-10-CM | POA: Diagnosis not present

## 2018-07-11 DIAGNOSIS — I129 Hypertensive chronic kidney disease with stage 1 through stage 4 chronic kidney disease, or unspecified chronic kidney disease: Secondary | ICD-10-CM | POA: Diagnosis not present

## 2018-07-11 DIAGNOSIS — I739 Peripheral vascular disease, unspecified: Secondary | ICD-10-CM | POA: Diagnosis not present

## 2018-07-11 DIAGNOSIS — N058 Unspecified nephritic syndrome with other morphologic changes: Secondary | ICD-10-CM | POA: Diagnosis not present

## 2018-07-11 DIAGNOSIS — N183 Chronic kidney disease, stage 3 (moderate): Secondary | ICD-10-CM | POA: Diagnosis not present

## 2018-07-11 DIAGNOSIS — E785 Hyperlipidemia, unspecified: Secondary | ICD-10-CM | POA: Diagnosis not present

## 2018-07-11 DIAGNOSIS — I701 Atherosclerosis of renal artery: Secondary | ICD-10-CM | POA: Diagnosis not present

## 2018-07-14 ENCOUNTER — Encounter: Payer: Self-pay | Admitting: Cardiovascular Disease

## 2018-07-14 ENCOUNTER — Telehealth: Payer: Self-pay

## 2018-07-14 ENCOUNTER — Ambulatory Visit (INDEPENDENT_AMBULATORY_CARE_PROVIDER_SITE_OTHER): Payer: Medicare Other | Admitting: Cardiovascular Disease

## 2018-07-14 ENCOUNTER — Telehealth (HOSPITAL_COMMUNITY): Payer: Self-pay

## 2018-07-14 ENCOUNTER — Other Ambulatory Visit: Payer: Self-pay

## 2018-07-14 VITALS — BP 134/78 | HR 78 | Ht 69.0 in | Wt 165.0 lb

## 2018-07-14 DIAGNOSIS — I2510 Atherosclerotic heart disease of native coronary artery without angina pectoris: Secondary | ICD-10-CM

## 2018-07-14 DIAGNOSIS — R0789 Other chest pain: Secondary | ICD-10-CM | POA: Diagnosis not present

## 2018-07-14 MED ORDER — NITROGLYCERIN 0.4 MG SL SUBL: 0.4000 mg | SUBLINGUAL_TABLET | SUBLINGUAL | 3 refills | Status: DC | PRN

## 2018-07-14 NOTE — Telephone Encounter (Signed)
Encounter complete. 

## 2018-07-14 NOTE — Patient Instructions (Signed)
Medication Instructions:  NONE If you need a refill on your cardiac medications before your next appointment, please call your pharmacy.   Lab work: NONE If you have labs (blood work) drawn today and your tests are completely normal, you will receive your results only by: [name] MyChart Message (if you have MyChart) OR . A paper copy in the mail If you have any lab test that is abnormal or we need to change your treatment, we will call you to review the results.  Testing/Procedures: Your physician has requested that you have en exercise stress myoview. For further information please visit [email or web address]. Please follow instruction sheet, as given.  SCHEDULE NEXT WEEK IF POSSIBLE  Follow-Up: At [facility], you and your health needs are our priority.  As part of our continuing mission to provide you with exceptional heart care, we have created designated Provider Care Teams.  These Care Teams include your primary Cardiologist (physician) and Advanced Practice Providers (APPs -  Physician Assistants and Nurse Practitioners) who all work together to provide you with the care you need, when you need it. . You will need a follow up appointment AFTER YOUR SCHEDULED EXERCISE STRESS MYOVIEW TEST. You may see Dr. Gwenlyn Found or one of the following Advanced Practice Providers on your designated Care Team:   . Kerin Ransom, [place] . Almyra Deforest, PA-C . Fabian Sharp, PA-C . Jory Sims, DNP . Rosaria Ferries, PA-C . Roby Lofts, PA-C . Sande Rives, PA-C

## 2018-07-14 NOTE — Telephone Encounter (Signed)
F/u call to pt to make aware that radioactive isotope for exercise myoview will not affect kidneys. Pt verbalized understanding

## 2018-07-14 NOTE — Telephone Encounter (Signed)
Rx(s) sent to pharmacy electronically.  

## 2018-07-14 NOTE — Progress Notes (Signed)
Mr. Donahoe returns today after last being seen 3 weeks ago with new onset exertional chest pressure.  It is somewhat atypical but it occurs when he walks on the treadmill which she does religiously.  He had bypass grafting in 2006.  His last Myoview was 01/31/2013 which is nonischemic and low risk.  And then a repeat an exercise Myoview stress test and will see him back after that for further evaluation.  [name], M.D., [place], [facility], [place] [address]. [place], [place]  [address]  [phone] 07/14/2018 9:09 AM

## 2018-07-19 ENCOUNTER — Ambulatory Visit (HOSPITAL_COMMUNITY)
Admission: RE | Admit: 2018-07-19 | Discharge: 2018-07-19 | Disposition: A | Discharge status: Home / Self Care | Payer: Medicare Other | Source: Ambulatory Visit | Attending: Cardiology | Admitting: Cardiology

## 2018-07-19 ENCOUNTER — Telehealth: Payer: Self-pay

## 2018-07-19 DIAGNOSIS — R0789 Other chest pain: Secondary | ICD-10-CM | POA: Diagnosis not present

## 2018-07-19 LAB — MYOCARDIAL PERFUSION IMAGING
CHL CUP NUCLEAR SRS: 8
LV dias vol: 71 mL (ref 62–150)
LV sys vol: 28 mL
Peak HR: 113 {beats}/min
Rest HR: 60 {beats}/min
SDS: 6
SSS: 14
TID: 1.21

## 2018-07-19 MED ORDER — REGADENOSON 0.4 MG/5ML IV SOLN
0.4000 mg | Freq: Once | INTRAVENOUS | Status: AC
  Administered 2018-07-19: 0.4 mg via INTRAVENOUS

## 2018-07-19 MED ORDER — AMINOPHYLLINE 25 MG/ML IV SOLN
75.0000 mg | Freq: Once | INTRAVENOUS | Status: AC
  Administered 2018-07-19: 75 mg via INTRAVENOUS

## 2018-07-19 MED ORDER — TECHNETIUM TC 99M TETROFOSMIN IV KIT
31.5000 | PACK | Freq: Once | INTRAVENOUS | Status: AC | PRN
  Administered 2018-07-19: 31.5 via INTRAVENOUS
  Filled 2018-07-19: qty 32

## 2018-07-19 MED ORDER — TECHNETIUM TC 99M TETROFOSMIN IV KIT
10.3000 | PACK | Freq: Once | INTRAVENOUS | Status: AC | PRN
  Administered 2018-07-19: 10.3 via INTRAVENOUS
  Filled 2018-07-19: qty 11

## 2018-07-19 NOTE — Telephone Encounter (Signed)
Pt aware to appear for appt with Gwenlyn Found on 07/21/2018 at 0900 and is agreeable with this

## 2018-07-21 ENCOUNTER — Other Ambulatory Visit: Payer: Self-pay

## 2018-07-21 ENCOUNTER — Telehealth: Payer: Self-pay | Admitting: Cardiovascular Disease

## 2018-07-21 ENCOUNTER — Ambulatory Visit (INDEPENDENT_AMBULATORY_CARE_PROVIDER_SITE_OTHER): Payer: Medicare Other | Admitting: Cardiovascular Disease

## 2018-07-21 ENCOUNTER — Encounter: Payer: Self-pay | Admitting: Cardiovascular Disease

## 2018-07-21 DIAGNOSIS — I2510 Atherosclerotic heart disease of native coronary artery without angina pectoris: Secondary | ICD-10-CM | POA: Diagnosis not present

## 2018-07-21 DIAGNOSIS — I25708 Atherosclerosis of coronary artery bypass graft(s), unspecified, with other forms of angina pectoris: Secondary | ICD-10-CM

## 2018-07-21 NOTE — Telephone Encounter (Signed)
Called patient, advised to take baby Asprin 81 mg.

## 2018-07-21 NOTE — Telephone Encounter (Signed)
  Patient needs clarification on the mg of aspirin he is supposed to take the morning of his upcoming procedure

## 2018-07-21 NOTE — H&P (View-Only) (Signed)
07/21/2018 ARROW TOMKO   [birth year]  [id number]  Primary Physician [name], MD Primary Cardiologist: [name] MD FACP, [place], [place], [place]  HPI:  [name] is a 78 y.o.   thin and fit-appearing married Caucasian male with no children, who I last saw in the office 06/03/2017.[name] He has a history of CAD and PVOD. He had coronary artery bypass grafting in 2006 with a LIMA to his LAD, a vein to the RCA, circumflex, and diagonal branches. He did develop paroxysmal atrial fibrillation postoperatively. He has PVOD, status post right carotid endarterectomy performed by Dr. [name], as well as a known aortoiliac and left SFA disease, but he denies claudication except after walking seven or eight blocks in his left calf. His other problems include treated hypertension and dyslipidemia. He had a catheterization July 13, 2010, and found to have patent grafts with moderate aortoiliac disease. He does have moderate renal insufficiency followed by Dr. [name]. He has had laparoscopic cholecystectomy that was uncomplicated in the past.. He did have a negative Myoview stress test 8/14.[name] He does exercise on treadmill for 25 minutes a day with minimal claudication    He did have a left renal artery stent placed by Dr. [name] 2/19 at the request of Dr. [name] .  He was hospitalized 05/01/2018 for 1 day because of dizziness and was found to have some symptomatic bradycardia.  Beta-blocker was discontinued and subsequent event monitor showed no arrhythmias.  I saw him approximately a week ago at which time he was complaining of new onset effort angina.  A Myoview stress test performed yesterday suggested ischemia in the LAD territory.  Current Meds  Medication Sig  . acyclovir (ZOVIRAX) 400 MG tablet Take 1 tablet (400 mg total) by mouth 5 (five) times daily. (Patient taking differently: Take 400 mg by mouth 5 (five) times daily. Takes as needed for herpes)  . aspirin  EC 81 MG tablet Take 81 mg by mouth at bedtime.  . Choline Fenofibrate (FENOFIBRIC ACID) 45 MG CPDR TAKE 1 CAPSULE (45MG ) BY MOUTH AT BEDTIME  . dimenhyDRINATE (DRAMAMINE) 50 MG tablet Take 50 mg by mouth every 8 (eight) hours as needed (vertigo).  [name] esomeprazole (NEXIUM) 40 MG capsule Take 40 mg by mouth daily as needed (heart burn).   . ezetimibe (ZETIA) 10 MG tablet Take 1 tablet (10 mg total) by mouth daily.  . furosemide (LASIX) 20 MG tablet Take 10 mg by mouth daily.  . nitroGLYCERIN (NITROSTAT) 0.4 MG SL tablet Place 1 tablet (0.4 mg total) under the tongue every 5 (five) minutes as needed for chest pain.  . simvastatin (ZOCOR) 80 MG tablet TAKE 1 TABLET (80 MG TOTAL) BY MOUTH DAILY AT 6 PM.     Allergies  Allergen Reactions  . Adhesive [Tape] Rash  . Latex Rash    Social History   Socioeconomic History  . Marital status: Married    Spouse name: [name]  . Number of children: 0  . Years of education: Not on file  . Highest education level: Not on file  Occupational History  . Occupation: Retired    Comment: Theme park manager: RETIRED  Social Needs  . Financial resource strain: Not on file  . Food insecurity:    Worry: Not on file    Inability: Not on file  . Transportation needs:    Medical: Not on file    Non-medical: Not on file  Tobacco Use  .  Smoking status: Never Smoker  . Smokeless tobacco: Never Used  Substance and Sexual Activity  . Alcohol use: Yes    Alcohol/week: 0.0 standard drinks    Comment: socially  . Drug use: No  . Sexual activity: Yes  Lifestyle  . Physical activity:    Days per week: Not on file    Minutes per session: Not on file  . Stress: Not on file  Relationships  . Social connections:    Talks on phone: Not on file    Gets together: Not on file    Attends religious service: Not on file    Active member of club or organization: Not on file    Attends meetings of clubs or organizations: Not on file    Relationship status: Not  on file  . Intimate partner violence:    Fear of current or ex partner: Not on file    Emotionally abused: Not on file    Physically abused: Not on file    Forced sexual activity: Not on file  Other Topics Concern  . Not on file  Social History Narrative   Last name is pronounced "cas ka" No caffeine use. Always uses seat belts. Exercise:Moderate walks 30 min daily and rides a bike. Uses treadmill 20 mins everyday. Smoke alarm in home, No guns in the home.      Marital status:  Married x 12 years: happily married. Most of family in [place].      Children; none      Lives: with wife      Employment: retired.      Tobacco; never       Alcohol: wine after lunch daily.      Exercise:  Treadmill 20 minutes per day seven days per week.  Also walking throughout the day.      Advanced Directives: YES: FULL CODE no prolonged measures.      ADLs:  Independent with ADLs; no assistant devices.  Drives.      Review of Systems: General: negative for chills, fever, night sweats or weight changes.  Cardiovascular: negative for chest pain, dyspnea on exertion, edema, orthopnea, palpitations, paroxysmal nocturnal dyspnea or shortness of breath Dermatological: negative for rash Respiratory: negative for cough or wheezing Urologic: negative for hematuria Abdominal: negative for nausea, vomiting, diarrhea, bright red blood per rectum, melena, or hematemesis Neurologic: negative for visual changes, syncope, or dizziness All other systems reviewed and are otherwise negative except as noted above.    Blood pressure 140/72, pulse 76, height 5\' 9"  (1.753 m), weight 166 lb 6.4 oz (75.5 kg).  General appearance: alert and no distress Neck: no adenopathy, no carotid bruit, no JVD, supple, symmetrical, trachea midline and thyroid not enlarged, symmetric, no tenderness/mass/nodules Lungs: clear to auscultation bilaterally Heart: regular rate and rhythm, S1, S2 normal, no murmur, click, rub or  gallop Extremities: extremities normal, atraumatic, no cyanosis or edema Pulses: 2+ and symmetric Skin: Skin color, texture, turgor normal. No rashes or lesions Neurologic: Alert and oriented X 3, normal strength and tone. Normal symmetric reflexes. Normal coordination and gait  EKG not performed today  ASSESSMENT AND PLAN:   Coronary artery disease S/p CABG in 2006 with a LIMA to the LAD, vein to the RCA, circumflex and diagonal branch.  He underwent repeat cardiac catheterization 07/13/2010 and was found to have patent grafts with moderate aortoiliac disease.  Over the last 2 weeks he is developed effort angina with occasional episodes of rest angina.  He had a  Myoview stress test performed yesterday that showed ischemia in the LAD territory.  Based on this I decided to proceed with outpatient diagnostic coronary angiography. The patient understands that risks included but are not limited to stroke (1 in 1000), death (1 in 61), kidney failure [usually temporary] (1 in 500), bleeding (1 in 200), allergic reaction [possibly serious] (1 in 200). The patient understands and agrees to proceed      [name] MD [facility], [facility] 07/21/2018 8:45 AM

## 2018-07-21 NOTE — Patient Instructions (Signed)
    [place] [address] Dept: [phone] Loc: [phone]  [name]  07/21/2018  You are scheduled for a Cardiac Catheterization on Monday, February 10 with Dr. Quay Burow.  1. Please arrive at the [facility] (Main Entrance A) at [facility]: [address] [place], [place] [address] at 11:00 AM (This time is two hours before your procedure to ensure your preparation). Free valet parking service is available.   Special note: Every effort is made to have your procedure done on time. Please understand that emergencies sometimes delay scheduled procedures.  2. Diet: Do not eat solid foods after midnight.  The patient may have clear liquids until 5am upon the day of the procedure.  3. Labs: You will need to have blood drawn TODAY: CBC, BMP 4. Medication instructions in preparation for your procedure:   Stop taking, Lasix (Furosemide)  Monday, July 24, 2018, the day of your procedure. You may resume this medication the following day.   On the morning of your procedure, take your Aspirin and any morning medicines NOT listed above.  You may use sips of water.  5. Plan for one night stay--bring personal belongings. 6. Bring a current list of your medications and current insurance cards. 7. You MUST have a responsible person to drive you home. 8. Someone MUST be with you the first 24 hours after you arrive home or your discharge will be delayed. 9. Please wear clothes that are easy to get on and off and wear slip-on shoes.  Thank you for allowing [place] to care for you!   -- [facility]   FOLLOW UP:  Please schedule a follow up appointment for 3 weeks after your procedure.

## 2018-07-21 NOTE — Progress Notes (Signed)
07/21/2018 [name]   03-01-1941  [id number]  Primary Physician [name], MD Primary Cardiologist: [name] MD FACP, [place], [place], [place]  HPI:  [name] is a 78 y.o.   thin and fit-appearing married Caucasian male with no children, who I last saw in the office 06/03/2017.[name] He has a history of CAD and PVOD. He had coronary artery bypass grafting in 2006 with a LIMA to his LAD, a vein to the RCA, circumflex, and diagonal branches. He did develop paroxysmal atrial fibrillation postoperatively. He has PVOD, status post right carotid endarterectomy performed by Dr. [name], as well as a known aortoiliac and left SFA disease, but he denies claudication except after walking seven or eight blocks in his left calf. His other problems include treated hypertension and dyslipidemia. He had a catheterization July 13, 2010, and found to have patent grafts with moderate aortoiliac disease. He does have moderate renal insufficiency followed by Dr. [name]. He has had laparoscopic cholecystectomy that was uncomplicated in the past.. He did have a negative Myoview stress test 8/14.[name] He does exercise on treadmill for 25 minutes a day with minimal claudication    He did have a left renal artery stent placed by Dr. [name] 2/19 at the request of Dr. [name] .  He was hospitalized 05/01/2018 for 1 day because of dizziness and was found to have some symptomatic bradycardia.  Beta-blocker was discontinued and subsequent event monitor showed no arrhythmias.  I saw him approximately a week ago at which time he was complaining of new onset effort angina.  A Myoview stress test performed yesterday suggested ischemia in the LAD territory.  Current Meds  Medication Sig  . acyclovir (ZOVIRAX) 400 MG tablet Take 1 tablet (400 mg total) by mouth 5 (five) times daily. (Patient taking differently: Take 400 mg by mouth 5 (five) times daily. Takes as needed for herpes)  . aspirin  EC 81 MG tablet Take 81 mg by mouth at bedtime.  . Choline Fenofibrate (FENOFIBRIC ACID) 45 MG CPDR TAKE 1 CAPSULE (45MG ) BY MOUTH AT BEDTIME  . dimenhyDRINATE (DRAMAMINE) 50 MG tablet Take 50 mg by mouth every 8 (eight) hours as needed (vertigo).  [name] esomeprazole (NEXIUM) 40 MG capsule Take 40 mg by mouth daily as needed (heart burn).   . ezetimibe (ZETIA) 10 MG tablet Take 1 tablet (10 mg total) by mouth daily.  . furosemide (LASIX) 20 MG tablet Take 10 mg by mouth daily.  . nitroGLYCERIN (NITROSTAT) 0.4 MG SL tablet Place 1 tablet (0.4 mg total) under the tongue every 5 (five) minutes as needed for chest pain.  . simvastatin (ZOCOR) 80 MG tablet TAKE 1 TABLET (80 MG TOTAL) BY MOUTH DAILY AT 6 PM.     Allergies  Allergen Reactions  . Adhesive [Tape] Rash  . Latex Rash    Social History   Socioeconomic History  . Marital status: Married    Spouse name: [name]  . Number of children: 0  . Years of education: Not on file  . Highest education level: Not on file  Occupational History  . Occupation: Retired    Comment: Theme park manager: RETIRED  Social Needs  . Financial resource strain: Not on file  . Food insecurity:    Worry: Not on file    Inability: Not on file  . Transportation needs:    Medical: Not on file    Non-medical: Not on file  Tobacco Use  .  Smoking status: Never Smoker  . Smokeless tobacco: Never Used  Substance and Sexual Activity  . Alcohol use: Yes    Alcohol/week: 0.0 standard drinks    Comment: socially  . Drug use: No  . Sexual activity: Yes  Lifestyle  . Physical activity:    Days per week: Not on file    Minutes per session: Not on file  . Stress: Not on file  Relationships  . Social connections:    Talks on phone: Not on file    Gets together: Not on file    Attends religious service: Not on file    Active member of club or organization: Not on file    Attends meetings of clubs or organizations: Not on file    Relationship status: Not  on file  . Intimate partner violence:    Fear of current or ex partner: Not on file    Emotionally abused: Not on file    Physically abused: Not on file    Forced sexual activity: Not on file  Other Topics Concern  . Not on file  Social History Narrative   Last name is pronounced "cas ka" No caffeine use. Always uses seat belts. Exercise:Moderate walks 30 min daily and rides a bike. Uses treadmill 20 mins everyday. Smoke alarm in home, No guns in the home.      Marital status:  Married x 12 years: happily married. Most of family in [place].      Children; none      Lives: with wife      Employment: retired.      Tobacco; never       Alcohol: wine after lunch daily.      Exercise:  Treadmill 20 minutes per day seven days per week.  Also walking throughout the day.      Advanced Directives: YES: FULL CODE no prolonged measures.      ADLs:  Independent with ADLs; no assistant devices.  Drives.      Review of Systems: General: negative for chills, fever, night sweats or weight changes.  Cardiovascular: negative for chest pain, dyspnea on exertion, edema, orthopnea, palpitations, paroxysmal nocturnal dyspnea or shortness of breath Dermatological: negative for rash Respiratory: negative for cough or wheezing Urologic: negative for hematuria Abdominal: negative for nausea, vomiting, diarrhea, bright red blood per rectum, melena, or hematemesis Neurologic: negative for visual changes, syncope, or dizziness All other systems reviewed and are otherwise negative except as noted above.    Blood pressure 140/72, pulse 76, height 5\' 9"  (1.753 m), weight 166 lb 6.4 oz (75.5 kg).  General appearance: alert and no distress Neck: no adenopathy, no carotid bruit, no JVD, supple, symmetrical, trachea midline and thyroid not enlarged, symmetric, no tenderness/mass/nodules Lungs: clear to auscultation bilaterally Heart: regular rate and rhythm, S1, S2 normal, no murmur, click, rub or  gallop Extremities: extremities normal, atraumatic, no cyanosis or edema Pulses: 2+ and symmetric Skin: Skin color, texture, turgor normal. No rashes or lesions Neurologic: Alert and oriented X 3, normal strength and tone. Normal symmetric reflexes. Normal coordination and gait  EKG not performed today  ASSESSMENT AND PLAN:   Coronary artery disease S/p CABG in 2006 with a LIMA to the LAD, vein to the RCA, circumflex and diagonal branch.  He underwent repeat cardiac catheterization 07/13/2010 and was found to have patent grafts with moderate aortoiliac disease.  Over the last 2 weeks he is developed effort angina with occasional episodes of rest angina.  He had a  Myoview stress test performed yesterday that showed ischemia in the LAD territory.  Based on this I decided to proceed with outpatient diagnostic coronary angiography. The patient understands that risks included but are not limited to stroke (1 in 1000), death (1 in 18), kidney failure [usually temporary] (1 in 500), bleeding (1 in 200), allergic reaction [possibly serious] (1 in 200). The patient understands and agrees to proceed      [name] MD [facility], [facility] 07/21/2018 8:45 AM

## 2018-07-21 NOTE — Addendum Note (Signed)
Addended by: Annita Brod on: 07/21/2018 09:02 AM   Modules accepted: Orders

## 2018-07-21 NOTE — Assessment & Plan Note (Signed)
S/p CABG in 2006 with a LIMA to the LAD, vein to the RCA, circumflex and diagonal branch.  He underwent repeat cardiac catheterization 07/13/2010 and was found to have patent grafts with moderate aortoiliac disease.  Over the last 2 weeks he is developed effort angina with occasional episodes of rest angina.  He had a Myoview stress test performed yesterday that showed ischemia in the LAD territory.  Based on this I decided to proceed with outpatient diagnostic coronary angiography. The patient understands that risks included but are not limited to stroke (1 in 1000), death (1 in 78), kidney failure [usually temporary] (1 in 500), bleeding (1 in 200), allergic reaction [possibly serious] (1 in 200). The patient understands and agrees to proceed

## 2018-07-22 LAB — BASIC METABOLIC PANEL
BUN / CREAT RATIO: 9 — AB (ref 10–24)
BUN: 12 mg/dL (ref 8–27)
CO2: 24 mmol/L (ref 20–29)
Calcium: 9.5 mg/dL (ref 8.6–10.2)
Chloride: 99 mmol/L (ref 96–106)
Creatinine, Ser: 1.41 mg/dL — ABNORMAL HIGH (ref 0.76–1.27)
GFR calc Af Amer: 56 mL/min/{1.73_m2} — ABNORMAL LOW (ref >59)
GFR calc non Af Amer: 48 mL/min/{1.73_m2} — ABNORMAL LOW (ref >59)
Glucose: 77 mg/dL (ref 65–99)
Potassium: 5 mmol/L (ref 3.5–5.2)
Sodium: 139 mmol/L (ref 134–144)

## 2018-07-22 LAB — CBC
Hematocrit: 50.3 % (ref 37.5–51.0)
Hemoglobin: 16.9 g/dL (ref 13.0–17.7)
MCH: 29.1 pg (ref 26.6–33.0)
MCHC: 33.6 g/dL (ref 31.5–35.7)
MCV: 87 fL (ref 79–97)
Platelets: 164 10*3/uL (ref 150–450)
RBC: 5.8 x10E6/uL (ref 4.14–5.80)
RDW: 13 % (ref 11.6–15.4)
WBC: 4.6 10*3/uL (ref 3.4–10.8)

## 2018-07-24 ENCOUNTER — Ambulatory Visit (HOSPITAL_COMMUNITY)
Admission: RE | Disposition: A | Discharge status: Home / Self Care | Payer: Self-pay | Source: Home / Self Care | Attending: Cardiovascular Disease

## 2018-07-24 ENCOUNTER — Ambulatory Visit (HOSPITAL_COMMUNITY)
Admission: RE | Admit: 2018-07-24 | Discharge: 2018-07-25 | Disposition: A | Discharge status: Home / Self Care | Payer: Medicare Other | Attending: Cardiovascular Disease | Admitting: Cardiovascular Disease

## 2018-07-24 ENCOUNTER — Other Ambulatory Visit: Payer: Self-pay

## 2018-07-24 DIAGNOSIS — E785 Hyperlipidemia, unspecified: Secondary | ICD-10-CM | POA: Diagnosis not present

## 2018-07-24 DIAGNOSIS — I2510 Atherosclerotic heart disease of native coronary artery without angina pectoris: Secondary | ICD-10-CM | POA: Diagnosis present

## 2018-07-24 DIAGNOSIS — Z79899 Other long term (current) drug therapy: Secondary | ICD-10-CM | POA: Insufficient documentation

## 2018-07-24 DIAGNOSIS — N289 Disorder of kidney and ureter, unspecified: Secondary | ICD-10-CM

## 2018-07-24 DIAGNOSIS — I480 Paroxysmal atrial fibrillation: Secondary | ICD-10-CM | POA: Diagnosis not present

## 2018-07-24 DIAGNOSIS — N183 Chronic kidney disease, stage 3 unspecified: Secondary | ICD-10-CM

## 2018-07-24 DIAGNOSIS — I10 Essential (primary) hypertension: Secondary | ICD-10-CM | POA: Diagnosis not present

## 2018-07-24 DIAGNOSIS — Z91040 Latex allergy status: Secondary | ICD-10-CM | POA: Insufficient documentation

## 2018-07-24 DIAGNOSIS — Z888 Allergy status to other drugs, medicaments and biological substances status: Secondary | ICD-10-CM | POA: Diagnosis not present

## 2018-07-24 DIAGNOSIS — I208 Other forms of angina pectoris: Secondary | ICD-10-CM | POA: Diagnosis present

## 2018-07-24 DIAGNOSIS — Z7982 Long term (current) use of aspirin: Secondary | ICD-10-CM | POA: Diagnosis not present

## 2018-07-24 DIAGNOSIS — I25810 Atherosclerosis of coronary artery bypass graft(s) without angina pectoris: Secondary | ICD-10-CM | POA: Diagnosis not present

## 2018-07-24 DIAGNOSIS — I25708 Atherosclerosis of coronary artery bypass graft(s), unspecified, with other forms of angina pectoris: Secondary | ICD-10-CM | POA: Diagnosis not present

## 2018-07-24 DIAGNOSIS — Z955 Presence of coronary angioplasty implant and graft: Secondary | ICD-10-CM

## 2018-07-24 DIAGNOSIS — I2583 Coronary atherosclerosis due to lipid rich plaque: Secondary | ICD-10-CM | POA: Diagnosis present

## 2018-07-24 HISTORY — DX: Other specified postprocedural states: Z98.890

## 2018-07-24 HISTORY — PX: LEFT HEART CATH AND CORS/GRAFTS ANGIOGRAPHY: CATH118250

## 2018-07-24 HISTORY — DX: Other complications of anesthesia, initial encounter: T88.59XA

## 2018-07-24 HISTORY — DX: Other specified postprocedural states: R11.2

## 2018-07-24 HISTORY — PX: CORONARY STENT INTERVENTION: CATH118234

## 2018-07-24 HISTORY — DX: Adverse effect of unspecified anesthetic, initial encounter: T41.45XA

## 2018-07-24 LAB — POCT ACTIVATED CLOTTING TIME: Activated Clotting Time: 356 seconds

## 2018-07-24 SURGERY — LEFT HEART CATH AND CORS/GRAFTS ANGIOGRAPHY
Anesthesia: LOCAL

## 2018-07-24 MED ORDER — FENTANYL CITRATE (PF) 100 MCG/2ML IJ SOLN
INTRAMUSCULAR | Status: DC | PRN
  Administered 2018-07-24: 25 ug via INTRAVENOUS

## 2018-07-24 MED ORDER — FAMOTIDINE IN NACL 20-0.9 MG/50ML-% IV SOLN
INTRAVENOUS | Status: DC | PRN
  Administered 2018-07-24: 20 mg via INTRAVENOUS

## 2018-07-24 MED ORDER — IOHEXOL 350 MG/ML SOLN
INTRAVENOUS | Status: DC | PRN
  Administered 2018-07-24: 135 mL via INTRAVENOUS

## 2018-07-24 MED ORDER — SODIUM CHLORIDE 0.9 % WEIGHT BASED INFUSION: 1.0000 mL/kg/h | INTRAVENOUS | Status: DC

## 2018-07-24 MED ORDER — SODIUM CHLORIDE 0.9 % WEIGHT BASED INFUSION
3.0000 mL/kg/h | INTRAVENOUS | Status: DC
  Administered 2018-07-24: 3 mL/kg/h via INTRAVENOUS

## 2018-07-24 MED ORDER — HYDRALAZINE HCL 20 MG/ML IJ SOLN
INTRAMUSCULAR | Status: AC
  Filled 2018-07-24: qty 1

## 2018-07-24 MED ORDER — HEPARIN (PORCINE) IN NACL 1000-0.9 UT/500ML-% IV SOLN
INTRAVENOUS | Status: DC | PRN
  Administered 2018-07-24 (×2): 500 mL

## 2018-07-24 MED ORDER — SODIUM CHLORIDE 0.9% FLUSH: 3.0000 mL | INTRAVENOUS | Status: DC | PRN

## 2018-07-24 MED ORDER — SODIUM CHLORIDE 0.9 % IV SOLN
INTRAVENOUS | Status: AC
  Administered 2018-07-24: 17:00:00 via INTRAVENOUS

## 2018-07-24 MED ORDER — SODIUM CHLORIDE 0.9 % IV SOLN
INTRAVENOUS | Status: DC | PRN
  Administered 2018-07-24 (×2): 1.75 mg/kg/h via INTRAVENOUS

## 2018-07-24 MED ORDER — BIVALIRUDIN TRIFLUOROACETATE 250 MG IV SOLR
INTRAVENOUS | Status: AC
  Filled 2018-07-24: qty 250

## 2018-07-24 MED ORDER — SODIUM CHLORIDE 0.9 % IV SOLN
1.7500 mg/kg/h | INTRAVENOUS | Status: AC
  Administered 2018-07-24: 1.75 mg/kg/h via INTRAVENOUS
  Filled 2018-07-24: qty 250

## 2018-07-24 MED ORDER — PANTOPRAZOLE SODIUM 40 MG PO TBEC
40.0000 mg | DELAYED_RELEASE_TABLET | Freq: Every day | ORAL | Status: DC
  Administered 2018-07-24: 22:00:00 40 mg via ORAL
  Filled 2018-07-24 (×2): qty 1

## 2018-07-24 MED ORDER — ANGIOPLASTY BOOK
Freq: Once | Status: AC
  Administered 2018-07-24: 22:00:00 1
  Filled 2018-07-24: qty 1

## 2018-07-24 MED ORDER — ACETAMINOPHEN 325 MG PO TABS
650.0000 mg | ORAL_TABLET | ORAL | Status: DC | PRN
  Administered 2018-07-24: 22:00:00 650 mg via ORAL
  Filled 2018-07-24: qty 2

## 2018-07-24 MED ORDER — SODIUM CHLORIDE 0.9 % IV SOLN: 250.0000 mL | INTRAVENOUS | Status: DC | PRN

## 2018-07-24 MED ORDER — MIDAZOLAM HCL 2 MG/2ML IJ SOLN
INTRAMUSCULAR | Status: DC | PRN
  Administered 2018-07-24: 1 mg via INTRAVENOUS

## 2018-07-24 MED ORDER — NITROGLYCERIN 0.4 MG SL SUBL: 0.4000 mg | SUBLINGUAL_TABLET | SUBLINGUAL | Status: DC | PRN

## 2018-07-24 MED ORDER — FAMOTIDINE IN NACL 20-0.9 MG/50ML-% IV SOLN
INTRAVENOUS | Status: AC
  Filled 2018-07-24: qty 50

## 2018-07-24 MED ORDER — HYDRALAZINE HCL 20 MG/ML IJ SOLN: 5.0000 mg | INTRAMUSCULAR | Status: AC | PRN

## 2018-07-24 MED ORDER — ASPIRIN 81 MG PO CHEW: 81.0000 mg | CHEWABLE_TABLET | ORAL | Status: DC

## 2018-07-24 MED ORDER — HEPARIN (PORCINE) IN NACL 1000-0.9 UT/500ML-% IV SOLN
INTRAVENOUS | Status: AC
  Filled 2018-07-24: qty 1000

## 2018-07-24 MED ORDER — EZETIMIBE 10 MG PO TABS
10.0000 mg | ORAL_TABLET | Freq: Every day | ORAL | Status: DC
  Administered 2018-07-24: 10 mg via ORAL
  Filled 2018-07-24: qty 1

## 2018-07-24 MED ORDER — ONDANSETRON HCL 4 MG/2ML IJ SOLN
4.0000 mg | Freq: Four times a day (QID) | INTRAMUSCULAR | Status: DC | PRN
  Filled 2018-07-24: qty 2

## 2018-07-24 MED ORDER — BIVALIRUDIN BOLUS VIA INFUSION - CUPID
INTRAVENOUS | Status: DC | PRN
  Administered 2018-07-24: 56.1 mg via INTRAVENOUS

## 2018-07-24 MED ORDER — NITROGLYCERIN 1 MG/10 ML FOR IR/CATH LAB
INTRA_ARTERIAL | Status: AC
  Filled 2018-07-24: qty 10

## 2018-07-24 MED ORDER — CLOPIDOGREL BISULFATE 300 MG PO TABS
ORAL_TABLET | ORAL | Status: DC | PRN
  Administered 2018-07-24: 600 mg via ORAL

## 2018-07-24 MED ORDER — CLOPIDOGREL BISULFATE 300 MG PO TABS
ORAL_TABLET | ORAL | Status: AC
  Filled 2018-07-24: qty 2

## 2018-07-24 MED ORDER — HYDRALAZINE HCL 20 MG/ML IJ SOLN
INTRAMUSCULAR | Status: DC | PRN
  Administered 2018-07-24: 10 mg via INTRAVENOUS

## 2018-07-24 MED ORDER — CLOPIDOGREL BISULFATE 75 MG PO TABS
75.0000 mg | ORAL_TABLET | Freq: Every day | ORAL | Status: DC
  Administered 2018-07-25: 75 mg via ORAL
  Filled 2018-07-24: qty 1

## 2018-07-24 MED ORDER — LABETALOL HCL 5 MG/ML IV SOLN: 10.0000 mg | INTRAVENOUS | Status: AC | PRN

## 2018-07-24 MED ORDER — ASPIRIN EC 81 MG PO TBEC: 81.0000 mg | DELAYED_RELEASE_TABLET | Freq: Every day | ORAL | Status: DC

## 2018-07-24 MED ORDER — MIDAZOLAM HCL 2 MG/2ML IJ SOLN
INTRAMUSCULAR | Status: AC
  Filled 2018-07-24: qty 2

## 2018-07-24 MED ORDER — SODIUM CHLORIDE 0.9% FLUSH
3.0000 mL | Freq: Two times a day (BID) | INTRAVENOUS | Status: DC
  Administered 2018-07-24: 3 mL via INTRAVENOUS

## 2018-07-24 MED ORDER — LIDOCAINE HCL (PF) 1 % IJ SOLN
INTRAMUSCULAR | Status: AC
  Filled 2018-07-24: qty 30

## 2018-07-24 MED ORDER — FUROSEMIDE 20 MG PO TABS
10.0000 mg | ORAL_TABLET | Freq: Every day | ORAL | Status: DC
  Filled 2018-07-24: qty 1

## 2018-07-24 MED ORDER — VERAPAMIL HCL 2.5 MG/ML IV SOLN
INTRAVENOUS | Status: AC
  Filled 2018-07-24: qty 2

## 2018-07-24 MED ORDER — SODIUM CHLORIDE 0.9% FLUSH: 3.0000 mL | Freq: Two times a day (BID) | INTRAVENOUS | Status: DC

## 2018-07-24 MED ORDER — FENOFIBRATE 54 MG PO TABS
54.0000 mg | ORAL_TABLET | Freq: Every day | ORAL | Status: DC
  Administered 2018-07-24: 54 mg via ORAL
  Filled 2018-07-24: qty 1

## 2018-07-24 MED ORDER — ASPIRIN 81 MG PO CHEW
81.0000 mg | CHEWABLE_TABLET | Freq: Every day | ORAL | Status: DC
  Filled 2018-07-24: qty 1

## 2018-07-24 MED ORDER — LIDOCAINE HCL (PF) 1 % IJ SOLN
INTRAMUSCULAR | Status: DC | PRN
  Administered 2018-07-24: 15 mL via INTRADERMAL

## 2018-07-24 MED ORDER — FENTANYL CITRATE (PF) 100 MCG/2ML IJ SOLN
INTRAMUSCULAR | Status: AC
  Filled 2018-07-24: qty 2

## 2018-07-24 SURGICAL SUPPLY — 22 items
BALLN SAPPHIRE 2.0X12 (BALLOONS) ×2
BALLOON SAPPHIRE 2.0X12 (BALLOONS) ×1 IMPLANT
CATH 5FR JL3.5 JR4 ANG PIG MP (CATHETERS) ×2 IMPLANT
CATH INFINITI 5 FR LCB (CATHETERS) ×2 IMPLANT
CATH INFINITI 5FR AL1 (CATHETERS) ×2 IMPLANT
CATH LAUNCHER 6FR AL1 (CATHETERS) ×1 IMPLANT
CATHETER LAUNCHER 6FR AL1 (CATHETERS) ×2
DEVICE CONTINUOUS FLUSH (MISCELLANEOUS) ×2 IMPLANT
KIT ENCORE 26 ADVANTAGE (KITS) ×2 IMPLANT
KIT HEART LEFT (KITS) ×2 IMPLANT
PACK CARDIAC CATHETERIZATION (CUSTOM PROCEDURE TRAY) ×2 IMPLANT
SHEATH PINNACLE 5F 10CM (SHEATH) ×2 IMPLANT
SHEATH PINNACLE 6F 10CM (SHEATH) ×2 IMPLANT
SHEATH PROBE COVER 6X72 (BAG) ×2 IMPLANT
STENT SYNERGY DES 2.5X16 (Permanent Stent) ×2 IMPLANT
STENT SYNERGY DES 2.5X20 (Permanent Stent) ×2 IMPLANT
SYR MEDRAD MARK 7 150ML (SYRINGE) ×2 IMPLANT
TRANSDUCER W/STOPCOCK (MISCELLANEOUS) ×2 IMPLANT
TUBING CIL FLEX 10 FLL-RA (TUBING) ×4 IMPLANT
WIRE ASAHI FIELDER XT 190CM (WIRE) ×2 IMPLANT
WIRE EMERALD 3MM-J .035X150CM (WIRE) ×2 IMPLANT
WIRE SION BLUE 180 (WIRE) ×2 IMPLANT

## 2018-07-24 NOTE — Interval H&P Note (Signed)
Cath Lab Visit (complete for each Cath Lab visit)  Clinical Evaluation Leading to the Procedure:   ACS: No.  Non-ACS:    Anginal Classification: CCS II  Anti-ischemic medical therapy: No Therapy  Non-Invasive Test Results: Intermediate-risk stress test findings: cardiac mortality 1-3%/year  Prior CABG: Previous CABG      History and Physical Interval Note:  07/24/2018 1:36 PM  [name]  has presented today for surgery, with the diagnosis of cad  The various methods of treatment have been discussed with the patient and family. After consideration of risks, benefits and other options for treatment, the patient has consented to  Procedure(s): LEFT HEART CATH AND CORS/GRAFTS ANGIOGRAPHY (N/A) as a surgical intervention .  The patient's history has been reviewed, patient examined, no change in status, stable for surgery.  I have reviewed the patient's chart and labs.  Questions were answered to the patient's satisfaction.     Quay Burow

## 2018-07-25 ENCOUNTER — Encounter (HOSPITAL_COMMUNITY): Payer: Self-pay | Admitting: Cardiovascular Disease

## 2018-07-25 DIAGNOSIS — I480 Paroxysmal atrial fibrillation: Secondary | ICD-10-CM | POA: Diagnosis not present

## 2018-07-25 DIAGNOSIS — I2510 Atherosclerotic heart disease of native coronary artery without angina pectoris: Secondary | ICD-10-CM

## 2018-07-25 DIAGNOSIS — E785 Hyperlipidemia, unspecified: Secondary | ICD-10-CM | POA: Diagnosis not present

## 2018-07-25 DIAGNOSIS — Z79899 Other long term (current) drug therapy: Secondary | ICD-10-CM | POA: Diagnosis not present

## 2018-07-25 DIAGNOSIS — I10 Essential (primary) hypertension: Secondary | ICD-10-CM | POA: Diagnosis not present

## 2018-07-25 DIAGNOSIS — Z888 Allergy status to other drugs, medicaments and biological substances status: Secondary | ICD-10-CM | POA: Diagnosis not present

## 2018-07-25 DIAGNOSIS — N289 Disorder of kidney and ureter, unspecified: Secondary | ICD-10-CM | POA: Diagnosis not present

## 2018-07-25 DIAGNOSIS — I25810 Atherosclerosis of coronary artery bypass graft(s) without angina pectoris: Secondary | ICD-10-CM | POA: Diagnosis not present

## 2018-07-25 DIAGNOSIS — Z91040 Latex allergy status: Secondary | ICD-10-CM | POA: Diagnosis not present

## 2018-07-25 DIAGNOSIS — Z7982 Long term (current) use of aspirin: Secondary | ICD-10-CM | POA: Diagnosis not present

## 2018-07-25 LAB — BASIC METABOLIC PANEL
Anion gap: 11 (ref 5–15)
BUN: 12 mg/dL (ref 8–23)
CO2: 20 mmol/L — ABNORMAL LOW (ref 22–32)
Calcium: 8.3 mg/dL — ABNORMAL LOW (ref 8.9–10.3)
Chloride: 107 mmol/L (ref 98–111)
Creatinine, Ser: 1.48 mg/dL — ABNORMAL HIGH (ref 0.61–1.24)
GFR calc Af Amer: 53 mL/min — ABNORMAL LOW (ref >60)
GFR calc non Af Amer: 45 mL/min — ABNORMAL LOW (ref >60)
GLUCOSE: 84 mg/dL (ref 70–99)
Potassium: 4 mmol/L (ref 3.5–5.1)
Sodium: 138 mmol/L (ref 135–145)

## 2018-07-25 LAB — CBC
HCT: 43.3 % (ref 39.0–52.0)
Hemoglobin: 14.7 g/dL (ref 13.0–17.0)
MCH: 29.1 pg (ref 26.0–34.0)
MCHC: 33.9 g/dL (ref 30.0–36.0)
MCV: 85.6 fL (ref 80.0–100.0)
Platelets: 144 10*3/uL — ABNORMAL LOW (ref 150–400)
RBC: 5.06 MIL/uL (ref 4.22–5.81)
RDW: 13.1 % (ref 11.5–15.5)
WBC: 5.9 10*3/uL (ref 4.0–10.5)
nRBC: 0 % (ref 0.0–0.2)

## 2018-07-25 MED ORDER — CLOPIDOGREL BISULFATE 75 MG PO TABS: 75.0000 mg | ORAL_TABLET | Freq: Every day | ORAL | 4 refills | Status: DC

## 2018-07-25 MED ORDER — METOPROLOL TARTRATE 12.5 MG HALF TABLET: 12.5000 mg | ORAL_TABLET | Freq: Two times a day (BID) | ORAL | Status: DC

## 2018-07-25 MED FILL — CLOPIDOGREL 75 MG TABLET: 75 | 30 days supply | Qty: 30 | Fill #0 | Status: TO

## 2018-07-25 MED FILL — Lidocaine HCl Local Preservative Free (PF) Inj 1%: INTRAMUSCULAR | Qty: 30 | Status: AC

## 2018-07-25 MED FILL — Verapamil HCl IV Soln 2.5 MG/ML: INTRAVENOUS | Qty: 2 | Status: AC

## 2018-07-25 NOTE — Discharge Summary (Addendum)
Discharge Summary    Patient ID: DEMOSTHENES VIRNIG MRN: [id number]; DOB: [birth year]  Admit date: 07/24/2018 Discharge date: 07/25/2018  Primary Care Provider: [name], MD  Primary Cardiologist: Quay Burow, MD   Discharge Diagnoses    Principal Problem:   Coronary artery disease Active Problems:   Disorder of kidney and ureter   Essential hypertension   CAD in native artery  Allergies Allergies  Allergen Reactions  . Adhesive [Tape] Rash  . Latex Rash    Diagnostic Studies/Procedures    Cardiac catheterization 07/24/2018:   Origin lesion is 100% stenosed.  Mid Graft to Dist Graft lesion is 99% stenosed.  Ost LM to Mid LM lesion is 80% stenosed.  Ost LAD to Prox LAD lesion is 80% stenosed.  Mid Graft to Dist Graft lesion is 70% stenosed.   IMPRESSION: Successful PCI and drug-eluting stenting of a high-grade diagonal branch vein graft with 2 synergy drug-eluting stents.  This corresponds to the area of abnormality on his Myoview stress testing.  He does have an occluded RCA vein graft or the RCA is widely patent.  He has a widely patent LIMA to the LAD.  He is got a patent vein graft to an obtuse marginal branch with a 70% fairly smooth focal stenosis however he did not have ischemia in this territory and this will be treated medically at this time.  We used a total of 135 cc of contrast.  Angiomax will continue for 4 hours full dose after which will be discontinued.  The sheath will be removed and pressure held.  Patient will be treated with dual antiplatelet therapy uninterrupted for at least 1 year.  He will be discharged home tomorrow.  History of Present Illness     [name] is a 78 y.o. M who was seen by Dr. Gwenlyn Found 06/03/2017. He has a history of CAD and PVOD. He had coronary artery bypass grafting in 2006 with a LIMA to his LAD, a vein to the RCA, circumflex, and diagonal branches. He did develop paroxysmal atrial fibrillation postoperatively.  He has PVOD, status post right carotid endarterectomy performed by Dr. [name], as well as a known aortoiliac and left SFA disease, but denies claudication except after walking seven or eight blocks in his left calf. His other problems include treated hypertension and dyslipidemia. He had a catheterization July 13, 2010, and found to have patent grafts with moderate aortoiliac disease. He does have moderate renal insufficiency followed by Dr. [name]. He has had laparoscopic cholecystectomy that was uncomplicated in the past. He did have a negative Myoview stress test 8/14. He does exercise on treadmill for 25 minutes a day with minimal claudication. He also had a left renal artery stent placed by Dr. [name] 2/19 at the request of Dr. [name]. He was hospitalized 05/01/2018 for 1 day because of dizziness and was found to have some symptomatic bradycardia. Beta-blocker was discontinued and subsequent event monitor showed no arrhythmias.  He last saw Dr. Gwenlyn Found late 06/2018 at which time he was complaining of new onset effort angina.  A Myoview stress test performed 07/20/2018 which suggested ischemia in the LAD territory. Based on this I decided to proceed with outpatient diagnostic coronary angiography scheduled for 07/24/2018.    Hospital Course    Cardiac catheterization performed on 07/24/2018 in which a successful PCI and drug-eluting stenting of a high-grade diagonal branch vein graft with 2 synergy drug-eluting stents were placed. This corresponds to the area of abnormality on his Myoview  stress testing.  He does have an occluded RCA vein graft or the RCA is widely patent.  He has a widely patent LIMA to the LAD.  He has a patent vein graft to an obtuse marginal branch with a 70% fairly smooth focal stenosis however he did not have ischemia in this territory and this will be treated medically at this time. Recommendations are for dual antiplatelet therapy uninterrupted for at least 1  year.  On day of discharge, cath site is unremarkable without s/s of hematoma. He has ambulated with cardiac rehabilitation. He has done well however with mild complaints of dizziness after ambulation.  Vital signs are stable.  Advised continue with monitoring with slow changes of position and increase fluid intake.  No BB was added in the setting of prior hx of bradycardia.   General: Well developed, well nourished, NAD Skin: Warm, dry, intact  Head: Normocephalic, atraumatic, clear, moist mucus membranes. Neck: Negative for carotid bruits. No JVD Lungs:Clear to ausculation bilaterally. No wheezes, rales, or rhonchi. Breathing is unlabored. Cardiovascular: RRR with S1 S2. No murmurs, rubs, gallops, or LV heave appreciated. Abdomen: Soft, non-tender, non-distended with normoactive bowel sounds. No obvious abdominal masses. MSK: Strength and tone appear normal for age. 5/5 in all extremities Extremities: No edema. No clubbing or cyanosis. DP/PT pulses 2+ bilaterally Neuro: Alert and oriented. No focal deficits. No facial asymmetry. MAE spontaneously. Psych: Responds to questions appropriately with normal affect.    Consultants: None   The patient was seen and examined by Dr. Irish Lack who feels that he is stable and ready for discharge today, 07/25/2018.  _____________  Discharge Vitals Blood pressure (!) 161/61, pulse 78, temperature (!) 97.5 F (36.4 C), temperature source Oral, resp. rate 20, height 5\' 9"  (1.753 m), weight 74.5 kg, SpO2 99 %.  Filed Weights   07/24/18 1118 07/24/18 1528 07/25/18 0111  Weight: 74.8 kg 76.9 kg 74.5 kg   Labs & Radiologic Studies    CBC Recent Labs    07/25/18 0551  WBC 5.9  HGB 14.7  HCT 43.3  MCV 85.6  PLT 510*   Basic Metabolic Panel Recent Labs    07/25/18 0551  NA 138  K 4.0  CL 107  CO2 20*  GLUCOSE 84  BUN 12  CREATININE 1.48*  CALCIUM 8.3*   Disposition   Pt is being discharged home today in good condition.  Follow-up  Plans & Appointments   Follow-up Information    [name], MD Follow up on 08/15/2018.   Specialties:  Cardiology, Radiology Why:  Your follow up appointment will be on 08/15/2018 with Dr. Gwenlyn Found at 130pm.  Contact information: [address] [place] [address] [phone]          Discharge Instructions    AMB Referral to Cardiac Rehabilitation - Phase II   Complete by:  As directed    Diagnosis:  Coronary Stents   Call MD for:  difficulty breathing, headache or visual disturbances   Complete by:  As directed    Call MD for:  extreme fatigue   Complete by:  As directed    Call MD for:  hives   Complete by:  As directed    Call MD for:  persistant dizziness or light-headedness   Complete by:  As directed    Call MD for:  persistant nausea and vomiting   Complete by:  As directed    Call MD for:  redness, tenderness, or signs of infection (pain,  swelling, redness, odor or green/yellow discharge around incision site)   Complete by:  As directed    Call MD for:  severe uncontrolled pain   Complete by:  As directed    Call MD for:  temperature >100.4   Complete by:  As directed    Diet - low sodium heart healthy   Complete by:  As directed    Discharge instructions   Complete by:  As directed    No driving for 3 days. No lifting over 5 lbs for 1 week. No sexual activity for 1 week. Keep procedure site clean & dry. If you notice increased pain, swelling, bleeding or pus, call/return!  You may shower, but no soaking baths/hot tubs/pools for 1 week.   PLEASE DO NOT MISS ANY DOSES OF YOUR PLAVIX!!!!! Also keep a log of you blood pressures and bring back to your follow up appt. Please call the office with any questions.   Patients taking blood thinners should generally stay away from medicines like ibuprofen, Advil, Motrin, naproxen, and Aleve due to risk of stomach bleeding. You may take Tylenol as directed or talk to your primary doctor about  alternatives.  Some studies suggest Prilosec/Omeprazole interacts with Plavix. We changed your Prilosec/Omeprazole to the equivalent dose of Protonix for less chance of interaction.   If you notice any bleeding such as blood in stool, black tarry stools, blood in urine, nosebleeds or any other unusual bleeding, call your doctor immediately. It is not normal to have this kind of bleeding while on a blood thinner and usually indicates there is an underlying problem with one of your body systems that needs to be checked out.   Increase activity slowly   Complete by:  As directed      Discharge Medications   Allergies as of 07/25/2018      Reactions   Adhesive [tape] Rash   Latex Rash      Medication List    STOP taking these medications   esomeprazole 40 MG capsule Commonly known as:  [place] these medications   acyclovir 400 MG tablet Commonly known as:  ZOVIRAX Take 1 tablet (400 mg total) by mouth 5 (five) times daily. What changed:  additional instructions   aspirin EC 81 MG tablet Take 81 mg by mouth daily.   clopidogrel 75 MG tablet Commonly known as:  PLAVIX Take 1 tablet (75 mg total) by mouth daily with breakfast.   DRAMAMINE 50 MG tablet Generic drug:  dimenhyDRINATE Take 50 mg by mouth every 8 (eight) hours as needed (vertigo).   ezetimibe 10 MG tablet Commonly known as:  ZETIA Take 1 tablet (10 mg total) by mouth daily.   Fenofibric Acid 45 MG Cpdr TAKE 1 CAPSULE (45MG ) BY MOUTH AT BEDTIME What changed:    how much to take  how to take this  when to take this  additional instructions   furosemide 20 MG tablet Commonly known as:  LASIX Take 10 mg by mouth daily.   hydrocortisone 2.5 % rectal cream Commonly known as:  ANUSOL-HC Place 1 application rectally daily as needed for anal itching (anal rash).   hydroxypropyl methylcellulose / hypromellose 2.5 % ophthalmic solution Commonly known as:  ISOPTO TEARS / GONIOVISC 1 drop 2 (two)  times daily.   nitroGLYCERIN 0.4 MG SL tablet Commonly known as:  NITROSTAT Place 1 tablet (0.4 mg total) under the tongue every 5 (five) minutes as needed for chest pain.   simvastatin  80 MG tablet Commonly known as:  ZOCOR TAKE 1 TABLET (80 MG TOTAL) BY MOUTH DAILY AT 6 PM.        Acute coronary syndrome (MI, NSTEMI, STEMI, etc) this admission?: Yes.     AHA/ACC Clinical Performance & Quality Measures: 1. Aspirin prescribed? - Yes 2. ADP Receptor Inhibitor (Plavix/Clopidogrel, Brilinta/Ticagrelor or Effient/Prasugrel) prescribed (includes medically managed patients)? - Yes 3. Beta Blocker prescribed? - No, hx of bradycardia 4. High Intensity Statin (Lipitor 40-80mg  or Crestor 20-40mg ) prescribed? - Yes 5. EF assessed during THIS hospitalization? - Yes 6. For EF <40%, was ACEI/ARB prescribed? - Not Applicable (EF >/= 61%) 7. For EF <40%, Aldosterone Antagonist (Spironolactone or Eplerenone) prescribed? - Not Applicable (EF >/= 53%) 8. Cardiac Rehab Phase II ordered (Included Medically managed Patients)? - Yes     Outstanding Labs/Studies   None   Duration of Discharge Encounter   Greater than 30 minutes including physician time.  Signed, Kathyrn Drown, NP 07/25/2018, 10:02 AM   I have examined the patient and reviewed assessment and plan and discussed with patient.  Agree with above as stated.    GEN: Well nourished, well developed, in no acute distress  HEENT: normal  Neck: no JVD, carotid bruits, or masses Cardiac: RRR; no murmurs, rubs, or gallops,no edema  Respiratory:  clear to auscultation bilaterally, normal work of breathing GI: soft, nontender, nondistended,  MS: no deformity or atrophy ; no right groin hematoma, 2+ right PT pulse Skin: warm and dry, no rash Neuro:  Strength and sensation are intact Psych: euthymic mood, full affect  Continue DAPT along with aggressive secondary prevention.  Healthy diet recommended.  Activity restrictions reviewed as well.     F/u with Dr. Gwenlyn Found.  Larae Grooms

## 2018-07-25 NOTE — Progress Notes (Signed)
CARDIAC REHAB PHASE I   PRE:  Rate/Rhythm: 79 SR  BP:  Supine:   Sitting: 144/64  Standing:    SaO2: 100 % RA  MODE:  Ambulation: 1320 ft   POST:  Rate/Rhythm: 83 SR  BP:  Supine:   Sitting: 169/63  Standing:    SaO2: 100 % RA  Pt walked with no complications. Approximately 15 mins after rest, pt stated he was slighly lightheaded. BP remained WNL at 140/83 with no change in EKG. Stent education reviewed with pt. Stressed importance of antiplatelet therapy. Pt to be D/cd on asa and plavix. Heart healthy diet and exercise reviewed. CRPII referral place and sent to [facility]. All questions answered.   [place], BSN 07/25/2018 10:09 AM   \

## 2018-07-25 NOTE — Progress Notes (Signed)
Site area: right groin  Site Prior to Removal:  Level 0  Pressure Applied For 30 MINUTES    Minutes Beginning at 21:42  Manual:   Yes.    Patient Status During Pull:  WNL  Post Pull Groin Site:  Level 0  Post Pull Instructions Given:  Yes.    Post Pull Pulses Present:  Yes.    Dressing Applied:  Yes.    Comments:  Pt tolerated well.

## 2018-07-26 ENCOUNTER — Ambulatory Visit: Payer: Medicare Other | Admitting: Family Medicine

## 2018-07-27 ENCOUNTER — Telehealth (HOSPITAL_COMMUNITY): Payer: Self-pay

## 2018-07-27 NOTE — Telephone Encounter (Signed)
Pt insurance is active and benefits verified through Medicare A/B. Co-pay $0.00, DED $198.00/$198.00 met, out of pocket $0.00/$0.00 met, co-insurance 20%. No pre-authorization required. Passport, 07/27/2018 @ 10:29AM, REF# [id number]  2ndary insurance is active and benefits verified through [facility]. Co-pay $0.00, DED $0.00/$0.00 met, out of pocket $0.00/$0.00 met, co-insurance 0%. No pre-authorization required. Passport, 07/27/2018 @ 10:32AM, REF# [id number]  Will contact patient to see if he is interested in the Cardiac Rehab Program. If interested, patient will need to complete follow up appt. Once completed, patient will be contacted for scheduling upon review by the RN Navigator.

## 2018-08-01 NOTE — Progress Notes (Signed)
Transitions of Care Follow Up Call Note  [name] is an 78 y.o. male who presented to [facility] on 07/24/2018.  The patient had the following prescriptions filled at [facility]: Clopidogrel   Patient was called by pharmacist and HIPAA identifiers were verified. The following questions were asked about the prescriptions filled at [facility]:  Has the patient been experiencing any side effects to the medications prescribed? no Understanding of regimen: good Understanding of indications: good Potential of compliance: good   [x]  Patient's prescriptions filled at the [facility] were transferred to the following pharmacy: [facility] on [address]  []  Patient unable to be reached after calling three times and prescriptions filled at the [facility] Transitions of Care Pharmacy were transferred to preferred pharmacy found within their chart.   Ignatius Specking Shanara Schnieders 08/01/2018, 6:16 PM Transitions of Care Pharmacy Hours: Monday - Friday 8:30am to 5:00 PM  Phone - [phone]

## 2018-08-02 ENCOUNTER — Ambulatory Visit: Payer: Medicare Other | Admitting: Cardiovascular Disease

## 2018-08-11 NOTE — Telephone Encounter (Signed)
Called and spoke with pt in regards to CR, pt stated he would like to wait until after his appt with Dr. Gwenlyn Found on 08/15/2018. He stated he will contact CR afterwards.  If pt does not follow up, will f/u w/pt in a week.

## 2018-08-15 ENCOUNTER — Ambulatory Visit (INDEPENDENT_AMBULATORY_CARE_PROVIDER_SITE_OTHER): Payer: Medicare Other | Admitting: Cardiovascular Disease

## 2018-08-15 ENCOUNTER — Encounter: Payer: Self-pay | Admitting: Cardiovascular Disease

## 2018-08-15 VITALS — BP 151/78 | HR 86 | Ht 69.0 in | Wt 165.8 lb

## 2018-08-15 DIAGNOSIS — I2510 Atherosclerotic heart disease of native coronary artery without angina pectoris: Secondary | ICD-10-CM | POA: Diagnosis not present

## 2018-08-15 DIAGNOSIS — I2583 Coronary atherosclerosis due to lipid rich plaque: Secondary | ICD-10-CM

## 2018-08-15 MED ORDER — PANTOPRAZOLE SODIUM 40 MG PO TBEC: 40.0000 mg | DELAYED_RELEASE_TABLET | Freq: Every day | ORAL | 6 refills | Status: DC

## 2018-08-15 NOTE — Assessment & Plan Note (Signed)
History of CAD status post coronary artery bypass grafting 2006 with a LIMA to the LAD, vein to the RCA, circumflex and diagonal branches.  Because of angina he underwent stress testing recently that showed diagonal branch distribution ischemia and based on this he underwent outpatient diagnostic coronary angiography by myself 07/24/2018 revealing a patent LIMA to the LAD, a vein to the circumflex obtuse marginal branch that had a 70% smooth stenosis and an occluded vein to the RCA with a widely patent native RCA.  Did have a 99% stenosis in the body of the diagonal branch vein graft underwent PCI and drug-eluting stenting with 2 overlapping synergy drug-eluting stents with an excellent result.  His symptoms have for the most part resolved although he does have some epigastric fullness which is somewhat different may be reflux related.  He is on dual antiplatelet therapy.

## 2018-08-15 NOTE — Patient Instructions (Addendum)
Medication Instructions:  Your physician has recommended you make the following change in your medication:   START (PANTOPRAZOLE) PROTONIX 40 MG BY MOUTH DAILY  If you need a refill on your cardiac medications before your next appointment, please call your pharmacy.   Lab work: NONE If you have labs (blood work) drawn today and your tests are completely normal, you will receive your results only by: [name] MyChart Message (if you have MyChart) OR . A paper copy in the mail If you have any lab test that is abnormal or we need to change your treatment, we will call you to review the results.  Testing/Procedures: NONE  Follow-Up: At [facility], you and your health needs are our priority.  As part of our continuing mission to provide you with exceptional heart care, we have created designated Provider Care Teams.  These Care Teams include your primary Cardiologist (physician) and Advanced Practice Providers (APPs -  Physician Assistants and Nurse Practitioners) who all work together to provide you with the care you need, when you need it. . You will need a follow up appointment in 3 months. You may see Dr. Gwenlyn Found or one of the following Advanced Practice Providers on your designated Care Team:   . Kerin Ransom, [place] . Almyra Deforest, PA-C . Fabian Sharp, PA-C . Jory Sims, DNP . Rosaria Ferries, PA-C . Roby Lofts, PA-C . Sande Rives, PA-C  Any Other Special Instructions Will Be Listed Below (If Applicable). PLEASE HAVE YOUR RECENT LAB WORK SENT TO OUR OFFICE. FAX NUMBER: [phone]

## 2018-08-15 NOTE — Progress Notes (Signed)
08/15/2018 [name]   07/19/1940  [id number]  Primary Physician [name], MD Primary Cardiologist: [name] MD FACP, [place], [place], [place]  HPI:  [name] is a 78 y.o.  thin and fit-appearing married Caucasian male with no children, who I last saw in the office 07/21/2018... He has a history of CAD and PVOD. He had coronary artery bypass grafting in 2006 with a LIMA to his LAD, a vein to the RCA, circumflex, and diagonal branches. He did develop paroxysmal atrial fibrillation postoperatively. He has PVOD, status post right carotid endarterectomy performed by Dr. [name], as well as a known aortoiliac and left SFA disease, but he denies claudication except after walking seven or eight blocks in his left calf. His other problems include treated hypertension and dyslipidemia. He had a catheterization July 13, 2010, and found to have patent grafts with moderate aortoiliac disease. He does have moderate renal insufficiency followed by Dr. [name]. He has had laparoscopic cholecystectomy that was uncomplicated in the past.. He did have a negative Myoview stress test 8/14.[name] He does exercise on treadmill for 25 minutes a day with minimal claudication   He did have a left renal artery stent placed by Dr. [name] 2/19 at the request of Dr. [name] .He was hospitalized 05/01/2018 for 1 day because of dizziness and was found to have some symptomatic bradycardia. Beta-blocker was discontinued and subsequent event monitor showed no arrhythmias.  He had a Myoview stress test performed 07/19/2018 that showed ischemia in the the diagonal branch territory.  This led to the outpatient cath performed 07/24/2018 revealing a 99% stenosis in the body of the vein graft to diagonal branch which was stented with 2 overlapping synergy drug-eluting stents.  His RCA vein graft is occluded but his RCA was widely patent.  His LIMA was patent and the vein to the obtuse marginal branch  had a 70% smooth stenosis which did not appear to be physiologically significant.  His symptoms have significantly resolved though he still has some epigastric fullness.   Current Meds  Medication Sig  . acyclovir (ZOVIRAX) 400 MG tablet Take 1 tablet (400 mg total) by mouth 5 (five) times daily. (Patient taking differently: Take 400 mg by mouth 5 (five) times daily. Takes as needed for herpes)  . aspirin EC 81 MG tablet Take 81 mg by mouth daily.   . Choline Fenofibrate (FENOFIBRIC ACID) 45 MG CPDR TAKE 1 CAPSULE (45MG ) BY MOUTH AT BEDTIME (Patient taking differently: Take 45 mg by mouth at bedtime. )  . clopidogrel (PLAVIX) 75 MG tablet Take 1 tablet (75 mg total) by mouth daily with breakfast.  . dimenhyDRINATE (DRAMAMINE) 50 MG tablet Take 50 mg by mouth every 8 (eight) hours as needed (vertigo).  . ezetimibe (ZETIA) 10 MG tablet Take 1 tablet (10 mg total) by mouth daily.  . furosemide (LASIX) 20 MG tablet Take 10 mg by mouth daily.  . hydrocortisone (ANUSOL-HC) 2.5 % rectal cream Place 1 application rectally daily as needed for anal itching (anal rash).   . hydroxypropyl methylcellulose / hypromellose (ISOPTO TEARS / GONIOVISC) 2.5 % ophthalmic solution 1 drop 2 (two) times daily.  . nitroGLYCERIN (NITROSTAT) 0.4 MG SL tablet Place 1 tablet (0.4 mg total) under the tongue every 5 (five) minutes as needed for chest pain.  . simvastatin (ZOCOR) 80 MG tablet TAKE 1 TABLET (80 MG TOTAL) BY MOUTH DAILY AT 6 PM.     Allergies  Allergen Reactions  .  Adhesive [Tape] Rash  . Latex Rash    Social History   Socioeconomic History  . Marital status: Married    Spouse name: [name]  . Number of children: 0  . Years of education: Not on file  . Highest education level: Not on file  Occupational History  . Occupation: Retired    Comment: Theme park manager: RETIRED  Social Needs  . Financial resource strain: Not on file  . Food insecurity:    Worry: Not on file    Inability: Not on  file  . Transportation needs:    Medical: Not on file    Non-medical: Not on file  Tobacco Use  . Smoking status: Never Smoker  . Smokeless tobacco: Never Used  Substance and Sexual Activity  . Alcohol use: Yes    Alcohol/week: 0.0 standard drinks    Comment: socially  . Drug use: No  . Sexual activity: Yes  Lifestyle  . Physical activity:    Days per week: Not on file    Minutes per session: Not on file  . Stress: Not on file  Relationships  . Social connections:    Talks on phone: Not on file    Gets together: Not on file    Attends religious service: Not on file    Active member of club or organization: Not on file    Attends meetings of clubs or organizations: Not on file    Relationship status: Not on file  . Intimate partner violence:    Fear of current or ex partner: Not on file    Emotionally abused: Not on file    Physically abused: Not on file    Forced sexual activity: Not on file  Other Topics Concern  . Not on file  Social History Narrative   Last name is pronounced "cas ka" No caffeine use. Always uses seat belts. Exercise:Moderate walks 30 min daily and rides a bike. Uses treadmill 20 mins everyday. Smoke alarm in home, No guns in the home.      Marital status:  Married x 12 years: happily married. Most of family in [place].      Children; none      Lives: with wife      Employment: retired.      Tobacco; never       Alcohol: wine after lunch daily.      Exercise:  Treadmill 20 minutes per day seven days per week.  Also walking throughout the day.      Advanced Directives: YES: FULL CODE no prolonged measures.      ADLs:  Independent with ADLs; no assistant devices.  Drives.      Review of Systems: General: negative for chills, fever, night sweats or weight changes.  Cardiovascular: negative for chest pain, dyspnea on exertion, edema, orthopnea, palpitations, paroxysmal nocturnal dyspnea or shortness of breath Dermatological: negative for  rash Respiratory: negative for cough or wheezing Urologic: negative for hematuria Abdominal: negative for nausea, vomiting, diarrhea, bright red blood per rectum, melena, or hematemesis Neurologic: negative for visual changes, syncope, or dizziness All other systems reviewed and are otherwise negative except as noted above.    Blood pressure (!) 151/78, pulse 86, height 5\' 9"  (1.753 m), weight 165 lb 12.8 oz (75.2 kg).  General appearance: alert and no distress Neck: no adenopathy, no carotid bruit, no JVD, supple, symmetrical, trachea midline and thyroid not enlarged, symmetric, no tenderness/mass/nodules Lungs: clear to auscultation bilaterally Heart: regular rate and rhythm,  S1, S2 normal, no murmur, click, rub or gallop Extremities: extremities normal, atraumatic, no cyanosis or edema Pulses: 2+ and symmetric Skin: Skin color, texture, turgor normal. No rashes or lesions Neurologic: Alert and oriented X 3, normal strength and tone. Normal symmetric reflexes. Normal coordination and gait  EKG not performed today  ASSESSMENT AND PLAN:   CAD in native artery History of CAD status post coronary artery bypass grafting 2006 with a LIMA to the LAD, vein to the RCA, circumflex and diagonal branches.  Because of angina he underwent stress testing recently that showed diagonal branch distribution ischemia and based on this he underwent outpatient diagnostic coronary angiography by myself 07/24/2018 revealing a patent LIMA to the LAD, a vein to the circumflex obtuse marginal branch that had a 70% smooth stenosis and an occluded vein to the RCA with a widely patent native RCA.  Did have a 99% stenosis in the body of the diagonal branch vein graft underwent PCI and drug-eluting stenting with 2 overlapping synergy drug-eluting stents with an excellent result.  His symptoms have for the most part resolved although he does have some epigastric fullness which is somewhat different may be reflux related.   He is on dual antiplatelet therapy.      [name] MD FACP,FACC,FAHA, [facility] 08/15/2018 1:51 PM

## 2018-08-16 ENCOUNTER — Other Ambulatory Visit: Payer: Self-pay | Admitting: Cardiovascular Disease

## 2018-08-16 DIAGNOSIS — N189 Chronic kidney disease, unspecified: Secondary | ICD-10-CM | POA: Diagnosis not present

## 2018-08-16 DIAGNOSIS — E785 Hyperlipidemia, unspecified: Secondary | ICD-10-CM | POA: Diagnosis not present

## 2018-08-16 DIAGNOSIS — N183 Chronic kidney disease, stage 3 (moderate): Secondary | ICD-10-CM | POA: Diagnosis not present

## 2018-08-16 MED ORDER — CLOPIDOGREL BISULFATE 75 MG PO TABS: 75.0000 mg | ORAL_TABLET | Freq: Every day | ORAL | 3 refills | Status: DC

## 2018-08-16 NOTE — Telephone Encounter (Signed)
Rx(s) sent to pharmacy electronically.  

## 2018-08-16 NOTE — Telephone Encounter (Signed)
New Message    *STAT* If patient is at the pharmacy, call can be transferred to refill team.   1. Which medications need to be refilled? (please list name of each medication and dose if known) Plavic 75mg   2. Which pharmacy/location (including street and city if local pharmacy) is medication to be sent to? [facility] [address]  3. Do they need a 30 day or 90 day supply? 90 day supply

## 2018-08-30 ENCOUNTER — Telehealth (HOSPITAL_COMMUNITY): Payer: Self-pay

## 2018-08-30 NOTE — Telephone Encounter (Signed)
Called and spoke with pt in regards to CR, adv  we have recv'd the pt referral. And at this time we are not scheduling due to the COVID-19. Once we have resume scheduling we will contact the pt. He verbalized understanding.

## 2018-09-27 ENCOUNTER — Ambulatory Visit: Payer: Self-pay

## 2018-10-10 ENCOUNTER — Telehealth (HOSPITAL_COMMUNITY): Payer: Self-pay | Admitting: *Deleted

## 2018-11-17 ENCOUNTER — Encounter: Payer: Self-pay | Admitting: Cardiovascular Disease

## 2018-11-17 ENCOUNTER — Ambulatory Visit (INDEPENDENT_AMBULATORY_CARE_PROVIDER_SITE_OTHER): Payer: Medicare Other | Admitting: Cardiovascular Disease

## 2018-11-17 ENCOUNTER — Other Ambulatory Visit: Payer: Self-pay

## 2018-11-17 DIAGNOSIS — I6523 Occlusion and stenosis of bilateral carotid arteries: Secondary | ICD-10-CM

## 2018-11-17 DIAGNOSIS — I10 Essential (primary) hypertension: Secondary | ICD-10-CM

## 2018-11-17 DIAGNOSIS — I2510 Atherosclerotic heart disease of native coronary artery without angina pectoris: Secondary | ICD-10-CM

## 2018-11-17 DIAGNOSIS — I739 Peripheral vascular disease, unspecified: Secondary | ICD-10-CM | POA: Diagnosis not present

## 2018-11-17 DIAGNOSIS — I779 Disorder of arteries and arterioles, unspecified: Secondary | ICD-10-CM | POA: Insufficient documentation

## 2018-11-17 NOTE — Assessment & Plan Note (Signed)
History of carotid artery disease status post remote right carotid endarterectomy by Dr. Doren Custard with recent carotid Dopplers performed 06/12/2018 revealing a widely patent endarterectomy site with moderate left ICA stenosis.  This will be repeated on an annual basis.

## 2018-11-17 NOTE — Progress Notes (Signed)
11/17/2018 [name]   Dec 09, 1940  [id number]  Primary Physician Jose Honour, MD Primary Cardiologist: Jose Harp MD FACP, [place], [place], [place]  HPI:  [name] is a 78 y.o.  thin and fit-appearing married Caucasian male with no children, who I last saw in the office 08/15/2018... He has a history of CAD and PVOD. He had coronary artery bypass grafting in 2006 with a LIMA to his LAD, a vein to the RCA, circumflex, and diagonal branches. He did develop paroxysmal atrial fibrillation postoperatively. He has PVOD, status post right carotid endarterectomy performed by Dr. Shanon Price, as well as a known aortoiliac and left SFA disease, but he denies claudication except after walking seven or eight blocks in his left calf. His other problems include treated hypertension and dyslipidemia. He had a catheterization July 13, 2010, and found to have patent grafts with moderate aortoiliac disease. He does have moderate renal insufficiency followed by [name]. He has had laparoscopic cholecystectomy that was uncomplicated in the past.. He did have a negative Myoview stress test 8/14.[name] He does exercise on treadmill for 25 minutes a day with minimal claudication  He did have a left renal artery stent placed by Dr. Kathlene Price 2/19 at the request of [name] .He was hospitalized 05/01/2018 for 1 day because of dizziness and was found to have some symptomatic bradycardia. Beta-blocker was discontinued and subsequent event monitor showed no arrhythmias.  He had a Myoview stress test performed 07/19/2018 that showed ischemia in the the diagonal branch territory.  This led to the outpatient cath performed 07/24/2018 revealing a 99% stenosis in the body of the vein graft to diagonal branch which was stented with 2 overlapping synergy drug-eluting stents.  His RCA vein graft is occluded but his RCA was widely patent.  His LIMA was patent and the vein to the obtuse marginal branch  had a 70% smooth stenosis which did not appear to be physiologically significant.  His symptoms have significantly resolved though he still has some epigastric fullness.  Since I saw him 3 months ago he continues to do well.  He walks on the treadmill 1618 minutes a day as well as several miles a day.  Does still complain of occasional left calf claudication.  He denies chest pain or shortness of breath.   Current Meds  Medication Sig   acyclovir (ZOVIRAX) 400 MG tablet Take 1 tablet (400 mg total) by mouth 5 (five) times daily. (Patient taking differently: Take 400 mg by mouth 5 (five) times daily. Takes as needed for herpes)   aspirin EC 81 MG tablet Take 81 mg by mouth daily.    Choline Fenofibrate (FENOFIBRIC ACID) 45 MG CPDR TAKE 1 CAPSULE (45MG ) BY MOUTH AT BEDTIME (Patient taking differently: Take 45 mg by mouth at bedtime. )   clopidogrel (PLAVIX) 75 MG tablet Take 1 tablet (75 mg total) by mouth daily with breakfast.   dimenhyDRINATE (DRAMAMINE) 50 MG tablet Take 50 mg by mouth every 8 (eight) hours as needed (vertigo).   ezetimibe (ZETIA) 10 MG tablet Take 1 tablet (10 mg total) by mouth daily.   furosemide (LASIX) 20 MG tablet Take 10 mg by mouth daily.   hydrocortisone (ANUSOL-HC) 2.5 % rectal cream Place 1 application rectally daily as needed for anal itching (anal rash).    hydroxypropyl methylcellulose / hypromellose (ISOPTO TEARS / GONIOVISC) 2.5 % ophthalmic solution 1 drop 2 (two) times daily.   nitroGLYCERIN (NITROSTAT) 0.4 MG SL tablet  Place 1 tablet (0.4 mg total) under the tongue every 5 (five) minutes as needed for chest pain.   pantoprazole (PROTONIX) 40 MG tablet Take 1 tablet (40 mg total) by mouth daily.   simvastatin (ZOCOR) 80 MG tablet TAKE 1 TABLET (80 MG TOTAL) BY MOUTH DAILY AT 6 PM.     Allergies  Allergen Reactions   Adhesive [Tape] Rash   Latex Rash    Social History   Socioeconomic History   Marital status: Married    Spouse name:  [name]   Number of children: 0   Years of education: Not on file   Highest education level: Not on file  Occupational History   Occupation: Retired    Comment: Theme park manager: RETIRED  Social Designer, fashion/clothing strain: Not on file   Food insecurity:    Worry: Not on file    Inability: Not on file   Transportation needs:    Medical: Not on file    Non-medical: Not on file  Tobacco Use   Smoking status: Never Smoker   Smokeless tobacco: Never Used  Substance and Sexual Activity   Alcohol use: Yes    Alcohol/week: 0.0 standard drinks    Comment: socially   Drug use: No   Sexual activity: Yes  Lifestyle   Physical activity:    Days per week: Not on file    Minutes per session: Not on file   Stress: Not on file  Relationships   Social connections:    Talks on phone: Not on file    Gets together: Not on file    Attends religious service: Not on file    Active member of club or organization: Not on file    Attends meetings of clubs or organizations: Not on file    Relationship status: Not on file   Intimate partner violence:    Fear of current or ex partner: Not on file    Emotionally abused: Not on file    Physically abused: Not on file    Forced sexual activity: Not on file  Other Topics Concern   Not on file  Social History Narrative   Last name is pronounced "cas ka" No caffeine use. Always uses seat belts. Exercise:Moderate walks 30 min daily and rides a bike. Uses treadmill 20 mins everyday. Smoke alarm in home, No guns in the home.      Marital status:  Married x 12 years: happily married. Most of family in [place].      Children; none      Lives: with wife      Employment: retired.      Tobacco; never       Alcohol: wine after lunch daily.      Exercise:  Treadmill 20 minutes per day seven days per week.  Also walking throughout the day.      Advanced Directives: YES: FULL CODE no prolonged measures.      ADLs:  Independent with  ADLs; no assistant devices.  Drives.      Review of Systems: General: negative for chills, fever, night sweats or weight changes.  Cardiovascular: negative for chest pain, dyspnea on exertion, edema, orthopnea, palpitations, paroxysmal nocturnal dyspnea or shortness of breath Dermatological: negative for rash Respiratory: negative for cough or wheezing Urologic: negative for hematuria Abdominal: negative for nausea, vomiting, diarrhea, bright red blood per rectum, melena, or hematemesis Neurologic: negative for visual changes, syncope, or dizziness All other systems reviewed and are  otherwise negative except as noted above.    Blood pressure (!) 163/80, pulse 86, temperature 97.7 F (36.5 C), height 5\' 9"  (1.753 m), weight 164 lb (74.4 kg).  General appearance: alert and no distress Neck: no adenopathy, no carotid bruit, no JVD, supple, symmetrical, trachea midline and thyroid not enlarged, symmetric, no tenderness/mass/nodules Lungs: clear to auscultation bilaterally Heart: regular rate and rhythm, S1, S2 normal, no murmur, click, rub or gallop Extremities: extremities normal, atraumatic, no cyanosis or edema Pulses: 2+ and symmetric Skin: Skin color, texture, turgor normal. No rashes or lesions Neurologic: Alert and oriented X 3, normal strength and tone. Normal symmetric reflexes. Normal coordination and gait  EKG not performed today  ASSESSMENT AND PLAN:   CAD in native artery History of CAD status post CABG 2006 with a LIMA to the LAD, vein to the RCA, circumflex and diagonal branches.  He had a cath July 13, 2010 patent grafts with moderate aortoiliac disease.  He had a Myoview stress test performed 07/19/2018 that showed ischemia in the diagonal branch territory which led to heart cath 07/24/2018 revealing 99% stenosis in the body of the vein graft to the diagonal branch which I stented with 2 overlapping synergy drug-eluting stents.  His RCA vein graft was occluded but his RCA  was widely patent.  His LIMA was patent and the vein to the obtuse marginal branch had a 70% smooth stenosis which did not appear to be hemodynamically significant.  He has had no recurrent symptoms.  He does exercise on the treadmill 1618 minutes a day and walks several miles a day as well.  HYPERLIPIDEMIA History of hyperlipidemia on Zetia and simvastatin with lipid profile performed 08/16/2018 revealing total cholesterol 139, LDL 64 and HDL 43.  Essential hypertension History of essential hypertension with blood pressure measured today at 163/80 although reviewing his blood pressure log shows that ordinarily at home is much better than this.  He is on no antihypertensive medications.  Peripheral arterial disease History of peripheral arterial disease with aortoiliac disease demonstrated at the time of cath in 2012.  Does get occasional left calf claudication.  His last Doppler studies performed 07/01/2015 revealed a left ABI 0.89.  We will repeat his lower extremity arterial Doppler studies.  Carotid artery disease (HCC) History of carotid artery disease status post remote right carotid endarterectomy by Dr. Doren Custard with recent carotid Dopplers performed 06/12/2018 revealing a widely patent endarterectomy site with moderate left ICA stenosis.  This will be repeated on an annual basis.      Jose Harp MD FACP,FACC,FAHA, [facility] 11/17/2018 1:56 PM

## 2018-11-17 NOTE — Assessment & Plan Note (Signed)
History of peripheral arterial disease with aortoiliac disease demonstrated at the time of cath in 2012.  Does get occasional left calf claudication.  His last Doppler studies performed 07/01/2015 revealed a left ABI 0.89.  We will repeat his lower extremity arterial Doppler studies.

## 2018-11-17 NOTE — Patient Instructions (Addendum)
Medication Instructions:  Your physician recommends that you continue on your current medications as directed. Please refer to the Current Medication list given to you today.  If you need a refill on your cardiac medications before your next appointment, please call your pharmacy.   Lab work: NONE If you have labs (blood work) drawn today and your tests are completely normal, you will receive your results only by: [name] MyChart Message (if you have MyChart) OR . A paper copy in the mail If you have any lab test that is abnormal or we need to change your treatment, we will call you to review the results.  Testing/Procedures: Your physician has requested that you have a carotid duplex. This test is an ultrasound of the carotid arteries in your neck. It looks at blood flow through these arteries that supply the brain with blood. Allow one hour for this exam. There are no restrictions or special instructions. TO BE SCHEDULED FOR December 2020  Your physician has requested that you have a lower or upper extremity arterial duplex. This test is an ultrasound of the arteries in the legs or arms. It looks at arterial blood flow in the legs and arms. Allow one hour for Lower and Upper Arterial scans. There are no restrictions or special instructions TO BE SCHEDULED FOR December 2020  Your physician has requested that you have an ankle brachial index (ABI). During this test an ultrasound and blood pressure cuff are used to evaluate the arteries that supply the arms and legs with blood. Allow thirty minutes for this exam. There are no restrictions or special instructions. TO BE SCHEDULED FOR December 2020  Follow-Up: At [facility], you and your health needs are our priority.  As part of our continuing mission to provide you with exceptional heart care, we have created designated Provider Care Teams.  These Care Teams include your primary Cardiologist (physician) and Advanced Practice Providers (APPs -   Physician Assistants and Nurse Practitioners) who all work together to provide you with the care you need, when you need it. . You will need a follow up appointment in 6 months WITH AN APP AND IN 12 MONTHS WITH DR. Gwenlyn Found.  Please call our office 2 months in advance to schedule this appointment.  You may see one of the following Advanced Practice Providers on your designated Care Team:   . Kerin Ransom, [place] . Almyra Deforest, PA-C . Fabian Sharp, PA-C . Jory Sims, DNP . Rosaria Ferries, PA-C . Roby Lofts, PA-C . Sande Rives, PA-C  Any Other Special Instructions Will Be Listed Below (If Applicable).  [facility] [address] [place], [place] [address] Phone - [phone] Fax - [phone]

## 2018-11-17 NOTE — Assessment & Plan Note (Signed)
History of hyperlipidemia on Zetia and simvastatin with lipid profile performed 08/16/2018 revealing total cholesterol 139, LDL 64 and HDL 43.

## 2018-11-17 NOTE — Assessment & Plan Note (Signed)
History of essential hypertension with blood pressure measured today at 163/80 although reviewing his blood pressure log shows that ordinarily at home is much better than this.  He is on no antihypertensive medications.

## 2018-11-17 NOTE — Assessment & Plan Note (Signed)
History of CAD status post CABG 2006 with a LIMA to the LAD, vein to the RCA, circumflex and diagonal branches.  He had a cath July 13, 2010 patent grafts with moderate aortoiliac disease.  He had a Myoview stress test performed 07/19/2018 that showed ischemia in the diagonal branch territory which led to heart cath 07/24/2018 revealing 99% stenosis in the body of the vein graft to the diagonal branch which I stented with 2 overlapping synergy drug-eluting stents.  His RCA vein graft was occluded but his RCA was widely patent.  His LIMA was patent and the vein to the obtuse marginal branch had a 70% smooth stenosis which did not appear to be hemodynamically significant.  He has had no recurrent symptoms.  He does exercise on the treadmill 1618 minutes a day and walks several miles a day as well.

## 2018-11-22 ENCOUNTER — Telehealth (HOSPITAL_COMMUNITY): Payer: Self-pay | Admitting: *Deleted

## 2018-11-22 DIAGNOSIS — E785 Hyperlipidemia, unspecified: Secondary | ICD-10-CM | POA: Diagnosis not present

## 2018-11-22 DIAGNOSIS — N189 Chronic kidney disease, unspecified: Secondary | ICD-10-CM | POA: Diagnosis not present

## 2018-11-22 DIAGNOSIS — N183 Chronic kidney disease, stage 3 (moderate): Secondary | ICD-10-CM | POA: Diagnosis not present

## 2018-11-29 DIAGNOSIS — L820 Inflamed seborrheic keratosis: Secondary | ICD-10-CM | POA: Diagnosis not present

## 2018-11-29 DIAGNOSIS — L110 Acquired keratosis follicularis: Secondary | ICD-10-CM | POA: Diagnosis not present

## 2018-11-29 DIAGNOSIS — L821 Other seborrheic keratosis: Secondary | ICD-10-CM | POA: Diagnosis not present

## 2018-11-29 DIAGNOSIS — D485 Neoplasm of uncertain behavior of skin: Secondary | ICD-10-CM | POA: Diagnosis not present

## 2018-11-29 DIAGNOSIS — Z85828 Personal history of other malignant neoplasm of skin: Secondary | ICD-10-CM | POA: Diagnosis not present

## 2018-12-13 ENCOUNTER — Other Ambulatory Visit: Payer: Self-pay | Admitting: Cardiovascular Disease

## 2018-12-17 ENCOUNTER — Other Ambulatory Visit: Payer: Self-pay

## 2018-12-17 ENCOUNTER — Emergency Department (HOSPITAL_COMMUNITY)
Admission: EM | Admit: 2018-12-17 | Discharge: 2018-12-17 | Disposition: A | Discharge status: Home / Self Care | Payer: Medicare Other | Attending: Emergency Medicine | Admitting: Emergency Medicine

## 2018-12-17 DIAGNOSIS — Z7982 Long term (current) use of aspirin: Secondary | ICD-10-CM | POA: Diagnosis not present

## 2018-12-17 DIAGNOSIS — Z91040 Latex allergy status: Secondary | ICD-10-CM | POA: Insufficient documentation

## 2018-12-17 DIAGNOSIS — Z955 Presence of coronary angioplasty implant and graft: Secondary | ICD-10-CM | POA: Diagnosis not present

## 2018-12-17 DIAGNOSIS — I2510 Atherosclerotic heart disease of native coronary artery without angina pectoris: Secondary | ICD-10-CM | POA: Diagnosis not present

## 2018-12-17 DIAGNOSIS — R42 Dizziness and giddiness: Secondary | ICD-10-CM | POA: Diagnosis not present

## 2018-12-17 DIAGNOSIS — I10 Essential (primary) hypertension: Secondary | ICD-10-CM | POA: Diagnosis not present

## 2018-12-17 DIAGNOSIS — Z951 Presence of aortocoronary bypass graft: Secondary | ICD-10-CM | POA: Diagnosis not present

## 2018-12-17 DIAGNOSIS — R1111 Vomiting without nausea: Secondary | ICD-10-CM | POA: Diagnosis not present

## 2018-12-17 DIAGNOSIS — Z7902 Long term (current) use of antithrombotics/antiplatelets: Secondary | ICD-10-CM | POA: Diagnosis not present

## 2018-12-17 DIAGNOSIS — Z79899 Other long term (current) drug therapy: Secondary | ICD-10-CM | POA: Diagnosis not present

## 2018-12-17 DIAGNOSIS — Q383 Other congenital malformations of tongue: Secondary | ICD-10-CM | POA: Insufficient documentation

## 2018-12-17 DIAGNOSIS — R110 Nausea: Secondary | ICD-10-CM | POA: Diagnosis not present

## 2018-12-17 DIAGNOSIS — Z85828 Personal history of other malignant neoplasm of skin: Secondary | ICD-10-CM | POA: Diagnosis not present

## 2018-12-17 DIAGNOSIS — R0902 Hypoxemia: Secondary | ICD-10-CM | POA: Diagnosis not present

## 2018-12-17 LAB — CBC WITH DIFFERENTIAL/PLATELET
Abs Immature Granulocytes: 0.04 10*3/uL (ref 0.00–0.07)
Basophils Absolute: 0.1 10*3/uL (ref 0.0–0.1)
Basophils Relative: 1 %
Eosinophils Absolute: 0.1 10*3/uL (ref 0.0–0.5)
Eosinophils Relative: 1 %
HCT: 48.6 % (ref 39.0–52.0)
Hemoglobin: 15.7 g/dL (ref 13.0–17.0)
Immature Granulocytes: 1 %
Lymphocytes Relative: 8 %
Lymphs Abs: 0.7 10*3/uL (ref 0.7–4.0)
MCH: 28.4 pg (ref 26.0–34.0)
MCHC: 32.3 g/dL (ref 30.0–36.0)
MCV: 87.9 fL (ref 80.0–100.0)
Monocytes Absolute: 0.6 10*3/uL (ref 0.1–1.0)
Monocytes Relative: 8 %
Neutro Abs: 6.3 10*3/uL (ref 1.7–7.7)
Neutrophils Relative %: 81 %
Platelets: 186 10*3/uL (ref 150–400)
RBC: 5.53 MIL/uL (ref 4.22–5.81)
RDW: 12.7 % (ref 11.5–15.5)
WBC: 7.7 10*3/uL (ref 4.0–10.5)
nRBC: 0 % (ref 0.0–0.2)

## 2018-12-17 LAB — BASIC METABOLIC PANEL
Anion gap: 7 (ref 5–15)
BUN: 15 mg/dL (ref 8–23)
CO2: 24 mmol/L (ref 22–32)
Calcium: 8.1 mg/dL — ABNORMAL LOW (ref 8.9–10.3)
Chloride: 106 mmol/L (ref 98–111)
Creatinine, Ser: 1.25 mg/dL — ABNORMAL HIGH (ref 0.61–1.24)
GFR calc Af Amer: >60 mL/min (ref >60)
GFR calc non Af Amer: 55 mL/min — ABNORMAL LOW (ref >60)
Glucose, Bld: 108 mg/dL — ABNORMAL HIGH (ref 70–99)
Potassium: 4.8 mmol/L (ref 3.5–5.1)
Sodium: 137 mmol/L (ref 135–145)

## 2018-12-17 MED ORDER — SODIUM CHLORIDE 0.9 % IV SOLN
INTRAVENOUS | Status: DC
  Administered 2018-12-17: 09:00:00 via INTRAVENOUS

## 2018-12-17 MED ORDER — LORAZEPAM 2 MG/ML IJ SOLN
0.5000 mg | Freq: Once | INTRAMUSCULAR | Status: AC
  Administered 2018-12-17: 0.5 mg via INTRAVENOUS
  Filled 2018-12-17: qty 1

## 2018-12-17 MED ORDER — SODIUM CHLORIDE 0.9 % IV SOLN: INTRAVENOUS | Status: DC

## 2018-12-17 MED ORDER — METOCLOPRAMIDE HCL 5 MG/ML IJ SOLN
5.0000 mg | Freq: Once | INTRAMUSCULAR | Status: AC
  Administered 2018-12-17: 5 mg via INTRAVENOUS
  Filled 2018-12-17: qty 2

## 2018-12-17 MED ORDER — SODIUM CHLORIDE 0.9 % IV BOLUS
500.0000 mL | Freq: Once | INTRAVENOUS | Status: AC
  Administered 2018-12-17: 500 mL via INTRAVENOUS

## 2018-12-17 MED ORDER — DIPHENHYDRAMINE HCL 50 MG/ML IJ SOLN
12.5000 mg | Freq: Once | INTRAMUSCULAR | Status: AC
  Administered 2018-12-17: 12.5 mg via INTRAVENOUS
  Filled 2018-12-17: qty 1

## 2018-12-17 MED ORDER — MECLIZINE HCL 25 MG PO TABS: 25.0000 mg | ORAL_TABLET | Freq: Three times a day (TID) | ORAL | 0 refills | Status: AC | PRN

## 2018-12-17 NOTE — ED Provider Notes (Signed)
[facility] DEPT Provider Note   CSN: [id number] Arrival date & time: 12/17/18  0746     History   Chief Complaint No chief complaint on file.   HPI [name] is a 78 y.o. male.     78 year old male with history of vertigo presents with acute onset of room spinning which started a few hours prior to arrival.  Denies having any headache.  Similar to his prior vertigo in the past.  Noted some ataxia but states that this is consistent with his prior episodes.  No fever or chills.  Denies any new focal weakness.  No visual changes.  Has had abdominal cramping and emesis but denies any specific pain.  Patient uses home medications without relief.  Called EMS and was transported here     Past Medical History:  Diagnosis Date  . Anemia, unspecified   . Basal cell carcinoma (BCC) of right forearm    [name]  . Benign paroxysmal positional vertigo   . Blood transfusion without reported diagnosis   . CAD (coronary artery disease)    HX CABG 2006 X 4; last nuc 11/2010 last cath 2012-wth 95% OM too small for intervention, treated medically   . Carcinoma in situ of skin, site unspecified   . Cerebrovascular disease, unspecified    hx CEA-RT; KNOWN AOTOILIAC 7 LT sfa disease  . Chronic renal insufficiency    glomerulonephritis 3500938.1  . Complication of anesthesia   . Congenital anomaly of tongue, unspecified   . Esophageal reflux   . Essential hypertension, benign   . Genital herpes, unspecified   . History of stress test 06/2010  . Hx of echocardiogram 01/2005   EF 55% normal study  . Lung mass   . PAF (paroxysmal atrial fibrillation) ([place])    post CABG, maintaining SR  . Peripheral vascular disease, unspecified ([place])   . PONV (postoperative nausea and vomiting)   . Pure hypercholesterolemia     Patient Active Problem List   Diagnosis Date Noted  . Carotid artery disease ([place]) 11/17/2018  . Vertigo   . Resting tremor 03/21/2018   . Tinnitus 08/30/2017  . Conductive hearing loss of both ears 05/30/2017  . Family history of colon cancer 03/22/2017  . Acute URI 10/26/2016  . Peripheral arterial disease ([place]) 03/07/2013  . Bradycardia 02/05/2013  . 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 CAD in native artery 02/05/2013  . Essential hypertension 09/04/2012  . Pure hypercholesterolemia 09/04/2012  . Abdominal pain, epigastric 09/04/2012  . Liver function test abnormality 09/04/2012  . Coronary artery disease 09/04/2012  . Occlusion and stenosis of carotid artery 09/04/2012  . HYPERLIPIDEMIA 11/24/2009  . Disorder of kidney and ureter 11/24/2009  . CT, CHEST, ABNORMAL 11/24/2009    Past Surgical History:  Procedure Laterality Date  . CARDIAC CATHETERIZATION  06/2010  . CAROTID ENDARTERECTOMY  2007   Right  . CHOLECYSTECTOMY  07/2010  . CORONARY ARTERY BYPASS GRAFT  05/12/2005   LIMA-LAD, VG-DIAG; VG-LCX,OM; VG-RCA  . CORONARY STENT INTERVENTION N/A 07/24/2018   Procedure: CORONARY STENT INTERVENTION;  Surgeon: [name], MD;  Location: [facility];  Service: Cardiovascular;  Laterality: N/A;  . IR RADIOLOGIST EVAL & MGMT  07/13/2017  . IR RADIOLOGIST EVAL & MGMT  09/20/2017  . IR RENAL BILAT S&I MOD SED  08/09/2017  . IR TRANSCATH PLC STENT 1ST ART NOT LE CV CAR VERT CAR  08/09/2017  . IR US GUIDE VASC ACCESS RIGHT  08/09/2017  .  LEFT HEART CATH AND CORS/GRAFTS ANGIOGRAPHY N/A 07/24/2018   Procedure: LEFT HEART CATH AND CORS/GRAFTS ANGIOGRAPHY;  Surgeon: [name], MD;  Location: [facility];  Service: Cardiovascular;  Laterality: N/A;        Home Medications    Prior to Admission medications   Medication Sig Start Date End Date Taking? Authorizing Provider  aspirin EC 81 MG tablet Take 81 mg by mouth daily.    Yes [provider]  Choline Fenofibrate (FENOFIBRIC ACID) 45 MG CPDR TAKE 1 CAPSULE (45MG ) BY MOUTH AT BEDTIME Patient taking differently: Take 45 mg by mouth at  bedtime.  05/17/18  Yes Lendon Colonel, NP  clopidogrel (PLAVIX) 75 MG tablet Take 1 tablet (75 mg total) by mouth daily with breakfast. 08/16/18  Yes [name], MD  dimenhyDRINATE (DRAMAMINE) 50 MG tablet Take 50 mg by mouth every 8 (eight) hours as needed (vertigo).   Yes [provider]  ezetimibe (ZETIA) 10 MG tablet Take 1 tablet (10 mg total) by mouth daily. 05/17/18  Yes Lendon Colonel, NP  furosemide (LASIX) 20 MG tablet Take 10 mg by mouth daily.   Yes [provider]  hydrocortisone (ANUSOL-HC) 2.5 % rectal cream Place 1 application rectally daily as needed for anal itching (anal rash).    Yes [provider]  hydroxypropyl methylcellulose / hypromellose (ISOPTO TEARS / GONIOVISC) 2.5 % ophthalmic solution 1 drop 2 (two) times daily.   Yes [provider]  nitroGLYCERIN (NITROSTAT) 0.4 MG SL tablet Place 1 tablet (0.4 mg total) under the tongue every 5 (five) minutes as needed for chest pain. 07/14/18  Yes [name], MD  pantoprazole (PROTONIX) 40 MG tablet Take 1 tablet (40 mg total) by mouth daily. 08/15/18  Yes [name], MD  acyclovir (ZOVIRAX) 400 MG tablet Take 1 tablet (400 mg total) by mouth 5 (five) times daily. Patient taking differently: Take 400 mg by mouth 5 (five) times daily. Takes as needed for herpes 03/16/17   [name], MD  simvastatin (ZOCOR) 80 MG tablet TAKE ONE TABLET BY MOUTH DAILY AT 6 PM 12/13/18   [name], MD    Family History Family History  Problem Relation Age of Onset  . Diabetes Mother   . Cancer Brother 57       colon cancer    Social History Social History   Tobacco Use  . Smoking status: Never Smoker  . Smokeless tobacco: Never Used  Substance Use Topics  . Alcohol use: Yes    Alcohol/week: 0.0 standard drinks    Comment: socially  . Drug use: No     Allergies   Adhesive [tape] and Latex   Review of Systems Review of Systems  All other systems reviewed and  are negative.    Physical Exam Updated Vital Signs BP (!) 174/73   Pulse (!) 59   Temp 97.6 F (36.4 C) (Oral)   Resp 18   SpO2 100%   Physical Exam Vitals signs and nursing note reviewed.  Constitutional:      General: He is not in acute distress.    Appearance: Normal appearance. He is well-developed. He is not toxic-appearing.  HENT:     Head: Normocephalic and atraumatic.  Eyes:     General: Lids are normal.     Conjunctiva/sclera: Conjunctivae normal.     Pupils: Pupils are equal, round, and reactive to light.  Neck:     Musculoskeletal: Normal range of  motion and neck supple.     Thyroid: No thyroid mass.     Trachea: No tracheal deviation.  Cardiovascular:     Rate and Rhythm: Normal rate and regular rhythm.     Heart sounds: Normal heart sounds. No murmur. No gallop.   Pulmonary:     Effort: Pulmonary effort is normal. No respiratory distress.     Breath sounds: Normal breath sounds. No stridor. No decreased breath sounds, wheezing, rhonchi or rales.  Abdominal:     General: Bowel sounds are normal. There is no distension.     Palpations: Abdomen is soft.     Tenderness: There is no abdominal tenderness. There is no rebound.  Musculoskeletal: Normal range of motion.        General: No tenderness.  Skin:    General: Skin is warm and dry.     Findings: No abrasion or rash.  Neurological:     Mental Status: He is alert and oriented to person, place, and time.     GCS: GCS eye subscore is 4. GCS verbal subscore is 5. GCS motor subscore is 6.     Cranial Nerves: No cranial nerve deficit.     Sensory: No sensory deficit.     Motor: No tremor.     Coordination: Finger-Nose-Finger Test normal.     Comments: Strength is 5 of 5 throughout  Psychiatric:        Speech: Speech normal.        Behavior: Behavior normal.      ED Treatments / Results  Labs (all labs ordered are listed, but only abnormal results are displayed) Labs Reviewed  CBC WITH  DIFFERENTIAL/PLATELET  BASIC METABOLIC PANEL    EKG None  Radiology No results found.  Procedures Procedures (including critical care time)  Medications Ordered in ED Medications  0.9 %  sodium chloride infusion (has no administration in time range)  sodium chloride 0.9 % bolus 500 mL (has no administration in time range)  0.9 %  sodium chloride infusion (has no administration in time range)  metoCLOPramide (REGLAN) injection 5 mg (has no administration in time range)  diphenhydrAMINE (BENADRYL) injection 12.5 mg (has no administration in time range)  LORazepam (ATIVAN) injection 0.5 mg (has no administration in time range)     Initial Impression / Assessment and Plan / ED Course  I have reviewed the triage vital signs and the nursing notes.  Pertinent labs & imaging results that were available during my care of the patient were reviewed by me and considered in my medical decision making (see chart for details).        78 year old male here with likely vertigo and feels better after medications.  He is amatory and stable for discharge.  Final Clinical Impressions(s) / ED Diagnoses   Final diagnoses:  None    ED Discharge Orders    None       Lacretia Leigh, MD 12/17/18 1117

## 2018-12-17 NOTE — ED Notes (Signed)
Bed: OI78 Expected date:  Expected time:  Means of arrival:  Comments: 78 yo N/V

## 2018-12-17 NOTE — ED Notes (Signed)
Discharge paperwork reviewed with pt, pt verbalized understanding.  Pt with questions about test results,  This RN referred pt to accessing mychart online, to which pt was agreeable.  Pt stated that wife will be coming to pick him up.  Pt will be wheeled to ED entrance to meet ride.

## 2018-12-17 NOTE — ED Notes (Signed)
PT AMBULATED FROM THE ROOM AND DOWN THE HALL AND BACK TO THE ROOM DONE WELL

## 2018-12-17 NOTE — ED Triage Notes (Signed)
Pt BIBA from home, hx of vertigo.  C/o n/v, similar to previous vertigo exacerbation, he just can not relieve sx at home.   EMS reports giving 4 mg Zofran at 0726.

## 2018-12-17 NOTE — ED Notes (Signed)
BMP blood draw recollected, per Lab request.

## 2018-12-20 DIAGNOSIS — E785 Hyperlipidemia, unspecified: Secondary | ICD-10-CM | POA: Diagnosis not present

## 2018-12-20 DIAGNOSIS — D751 Secondary polycythemia: Secondary | ICD-10-CM | POA: Diagnosis not present

## 2018-12-20 DIAGNOSIS — N183 Chronic kidney disease, stage 3 (moderate): Secondary | ICD-10-CM | POA: Diagnosis not present

## 2018-12-20 DIAGNOSIS — I739 Peripheral vascular disease, unspecified: Secondary | ICD-10-CM | POA: Diagnosis not present

## 2018-12-20 DIAGNOSIS — I7090 Unspecified atherosclerosis: Secondary | ICD-10-CM | POA: Diagnosis not present

## 2018-12-20 DIAGNOSIS — R42 Dizziness and giddiness: Secondary | ICD-10-CM | POA: Diagnosis not present

## 2018-12-20 DIAGNOSIS — I129 Hypertensive chronic kidney disease with stage 1 through stage 4 chronic kidney disease, or unspecified chronic kidney disease: Secondary | ICD-10-CM | POA: Diagnosis not present

## 2018-12-20 DIAGNOSIS — N057 Unspecified nephritic syndrome with diffuse crescentic glomerulonephritis: Secondary | ICD-10-CM | POA: Diagnosis not present

## 2018-12-20 DIAGNOSIS — I701 Atherosclerosis of renal artery: Secondary | ICD-10-CM | POA: Diagnosis not present

## 2018-12-20 DIAGNOSIS — N058 Unspecified nephritic syndrome with other morphologic changes: Secondary | ICD-10-CM | POA: Diagnosis not present

## 2019-01-17 DIAGNOSIS — H6123 Impacted cerumen, bilateral: Secondary | ICD-10-CM | POA: Diagnosis not present

## 2019-01-17 DIAGNOSIS — H9042 Sensorineural hearing loss, unilateral, left ear, with unrestricted hearing on the contralateral side: Secondary | ICD-10-CM | POA: Diagnosis not present

## 2019-01-17 DIAGNOSIS — H9312 Tinnitus, left ear: Secondary | ICD-10-CM | POA: Diagnosis not present

## 2019-01-17 DIAGNOSIS — R42 Dizziness and giddiness: Secondary | ICD-10-CM | POA: Diagnosis not present

## 2019-01-30 ENCOUNTER — Ambulatory Visit: Payer: Medicare Other

## 2019-02-13 DIAGNOSIS — D485 Neoplasm of uncertain behavior of skin: Secondary | ICD-10-CM | POA: Diagnosis not present

## 2019-02-13 DIAGNOSIS — L821 Other seborrheic keratosis: Secondary | ICD-10-CM | POA: Diagnosis not present

## 2019-02-13 DIAGNOSIS — D045 Carcinoma in situ of skin of trunk: Secondary | ICD-10-CM | POA: Diagnosis not present

## 2019-02-13 DIAGNOSIS — L812 Freckles: Secondary | ICD-10-CM | POA: Diagnosis not present

## 2019-02-13 DIAGNOSIS — D1801 Hemangioma of skin and subcutaneous tissue: Secondary | ICD-10-CM | POA: Diagnosis not present

## 2019-02-13 DIAGNOSIS — Z85828 Personal history of other malignant neoplasm of skin: Secondary | ICD-10-CM | POA: Diagnosis not present

## 2019-02-22 DIAGNOSIS — E785 Hyperlipidemia, unspecified: Secondary | ICD-10-CM | POA: Diagnosis not present

## 2019-02-22 DIAGNOSIS — N189 Chronic kidney disease, unspecified: Secondary | ICD-10-CM | POA: Diagnosis not present

## 2019-02-22 DIAGNOSIS — N183 Chronic kidney disease, stage 3 (moderate): Secondary | ICD-10-CM | POA: Diagnosis not present

## 2019-02-28 DIAGNOSIS — H6121 Impacted cerumen, right ear: Secondary | ICD-10-CM | POA: Diagnosis not present

## 2019-03-10 ENCOUNTER — Other Ambulatory Visit: Payer: Self-pay | Admitting: Cardiovascular Disease

## 2019-03-28 ENCOUNTER — Telehealth: Payer: Self-pay

## 2019-03-28 ENCOUNTER — Telehealth: Payer: Self-pay | Admitting: Cardiovascular Disease

## 2019-03-28 NOTE — Telephone Encounter (Signed)
Spoke to pt about concerns with chest pressure. States it happens only with exertion. Pt states that he walks on treadmill regularly and just in the past couple of weeks, he can not do a full 17 mins like normally d/t chest pressure and SOB. He states that he gets more winded and this is a new problem. Asked the pt any other symptoms, says the pressure radiates to his left arm. Asked the pt if he has taken any nitro during that time, denies. Made pt appt with Dr Gwenlyn Found and advised him to not over-exert himself until his appt. Educated pt to call 911 if the chest pressure comes back and does not subside.

## 2019-03-28 NOTE — Telephone Encounter (Signed)
New Message  Pt c/o of Chest Pain: STAT if CP now or developed within 24 hours  1. Are you having CP right now? No  2. Are you experiencing any other symptoms (ex. SOB, nausea, vomiting, sweating)? No  3. How long have you been experiencing CP? Today after doing 10 minutes on the treadmill.  4. Is your CP continuous or coming and going? Coming and Going  5. Have you taken Nitroglycerin? No  Appointment scheduled with Dr. Gwenlyn Found on 04/04/19 at 12:00 noon.  ?

## 2019-04-04 ENCOUNTER — Telehealth (INDEPENDENT_AMBULATORY_CARE_PROVIDER_SITE_OTHER): Payer: Medicare Other | Admitting: Cardiovascular Disease

## 2019-04-04 ENCOUNTER — Encounter: Payer: Self-pay | Admitting: Cardiovascular Disease

## 2019-04-04 ENCOUNTER — Telehealth: Payer: Self-pay

## 2019-04-04 DIAGNOSIS — I10 Essential (primary) hypertension: Secondary | ICD-10-CM | POA: Diagnosis not present

## 2019-04-04 DIAGNOSIS — E782 Mixed hyperlipidemia: Secondary | ICD-10-CM

## 2019-04-04 DIAGNOSIS — I2510 Atherosclerotic heart disease of native coronary artery without angina pectoris: Secondary | ICD-10-CM

## 2019-04-04 DIAGNOSIS — I6523 Occlusion and stenosis of bilateral carotid arteries: Secondary | ICD-10-CM | POA: Diagnosis not present

## 2019-04-04 MED ORDER — ISOSORBIDE MONONITRATE ER 30 MG PO TB24: 15.0000 mg | ORAL_TABLET | Freq: Every day | ORAL | 3 refills | Status: DC

## 2019-04-04 NOTE — Telephone Encounter (Signed)
Patient  aware of 10/21 AVS instructions and verbalized understanding.  Letter including After Visit Summary and any other necessary documents to be mailed to the patient's address on file.  Staff message sent to Medical Records pool to mail AVS to patient address.  

## 2019-04-04 NOTE — Progress Notes (Signed)
Virtual Visit via Telephone Note   This visit type was conducted due to national recommendations for restrictions regarding the COVID-19 Pandemic (e.g. social distancing) in an effort to limit this patient's exposure and mitigate transmission in our community.  Due to his co-morbid illnesses, this patient is at least at moderate risk for complications without adequate follow up.  This format is felt to be most appropriate for this patient at this time.  The patient did not have access to video technology/had technical difficulties with video requiring transitioning to audio format only (telephone).  All issues noted in this document were discussed and addressed.  No physical exam could be performed with this format.  Please refer to the patient's chart for his  consent to telehealth for [facility].   Date:  04/04/2019   ID:  [name], DOB 1940-09-23, MRN [id number]  Patient Location: Home Provider Location: Home  PCP:  [name], MD  Cardiologist:  Quay Burow, MD  Electrophysiologist:  None   Evaluation Performed:  Follow-Up Visit  Chief Complaint: Follow-up CAD  History of Present Illness:    [name] is a 78 y.o.   thin and fit-appearing married Caucasian male with no children, who I last saw in theoffice  11/17/2018. He has a history of CAD and PVOD. He had coronary artery bypass grafting in 2006 with a LIMA to his LAD, a vein to the RCA, circumflex, and diagonal branches. He did develop paroxysmal atrial fibrillation postoperatively. He has PVOD, status post right carotid endarterectomy performed by Dr. [name], as well as a known aortoiliac and left SFA disease, but he denies claudication except after walking seven or eight blocks in his left calf. His other problems include treated hypertension and dyslipidemia. He had a catheterization July 13, 2010, and found to have patent grafts with moderate aortoiliac disease. He does have moderate renal  insufficiency followed by Dr. [name]. He has had laparoscopic cholecystectomy that was uncomplicated in the past.. He did have a negative Myoview stress test 8/14.[name] He does exercise on treadmill for 25 minutes a day with minimal claudication  He did have a left renal artery stent placed by Dr. [name] 2/19 at the request of Dr. [name] .He was hospitalized 05/01/2018 for 1 day because of dizziness and was found to have some symptomatic bradycardia. Beta-blocker was discontinued and subsequent event monitor showed no arrhythmias.  He had a Myoview stress test performed 07/19/2018 that showed ischemia in the the diagonal branch territory. This led to the outpatient cath performed 07/24/2018 revealing a 99% stenosis in the body of the vein graft to diagonal branch which was stented with 2 overlapping synergy drug-eluting stents. His RCA vein graft is occluded but his RCA was widely patent. His LIMA was patent and the vein to the obtuse marginal branch had a 70% smooth stenosis which did not appear to be physiologically significant. His symptoms have significantly resolved though he still has some epigastric fullness.  Since I saw him 4 months ago his right calf claudication has spontaneously resolved.  He did have an episode of vertigo throughout the month of July which has resolved as well.  Over the last 3 to 6 weeks he is noticed some exertional chest tightness while on the treadmill as well as some fatigue.  It should be noted that his symptoms resolved back in February after I stented his diagonal branch vein graft.  The patient does not have symptoms concerning for COVID-19 infection (fever, chills,  cough, or new shortness of breath).    Past Medical History:  Diagnosis Date   Anemia, unspecified    Basal cell carcinoma (BCC) of right forearm    [name]   Benign paroxysmal positional vertigo    Blood transfusion without reported diagnosis    CAD (coronary artery disease)     HX CABG 2006 X 4; last nuc 11/2010 last cath 2012-wth 95% OM too small for intervention, treated medically    Carcinoma in situ of skin, site unspecified    Cerebrovascular disease, unspecified    hx CEA-RT; KNOWN AOTOILIAC 7 LT sfa disease   Chronic renal insufficiency    glomerulonephritis [id number]   Complication of anesthesia    Congenital anomaly of tongue, unspecified    Esophageal reflux    Essential hypertension, benign    Genital herpes, unspecified    History of stress test 06/2010   Hx of echocardiogram 01/2005   EF 55% normal study   Lung mass    PAF (paroxysmal atrial fibrillation) ([place])    post CABG, maintaining SR   Peripheral vascular disease, unspecified ([place])    PONV (postoperative nausea and vomiting)    Pure hypercholesterolemia    Past Surgical History:  Procedure Laterality Date   CARDIAC CATHETERIZATION  06/2010   CAROTID ENDARTERECTOMY  2007   Right   CHOLECYSTECTOMY  07/2010   CORONARY ARTERY BYPASS GRAFT  05/12/2005   LIMA-LAD, VG-DIAG; VG-LCX,OM; VG-RCA   CORONARY STENT INTERVENTION N/A 07/24/2018   Procedure: CORONARY STENT INTERVENTION;  Surgeon: [name], MD;  Location: [facility];  Service: Cardiovascular;  Laterality: N/A;   IR RADIOLOGIST EVAL & MGMT  07/13/2017   IR RADIOLOGIST EVAL & MGMT  09/20/2017   IR RENAL BILAT S&I MOD SED  08/09/2017   IR TRANSCATH PLC STENT 1ST ART NOT LE CV CAR VERT CAR  08/09/2017   IR US GUIDE VASC ACCESS RIGHT  08/09/2017   LEFT HEART CATH AND CORS/GRAFTS ANGIOGRAPHY N/A 07/24/2018   Procedure: LEFT HEART CATH AND CORS/GRAFTS ANGIOGRAPHY;  Surgeon: [name], MD;  Location: [facility];  Service: Cardiovascular;  Laterality: N/A;     Current Meds  Medication Sig   acyclovir (ZOVIRAX) 400 MG tablet Take 1 tablet (400 mg total) by mouth 5 (five) times daily. (Patient taking differently: Take 400 mg by mouth 5 (five) times daily. Takes as needed for herpes)    aspirin EC 81 MG tablet Take 81 mg by mouth daily.    Choline Fenofibrate (FENOFIBRIC ACID) 45 MG CPDR TAKE 1 CAPSULE (45MG ) BY MOUTH AT BEDTIME (Patient taking differently: Take 45 mg by mouth at bedtime. )   clopidogrel (PLAVIX) 75 MG tablet Take 1 tablet (75 mg total) by mouth daily with breakfast.   dimenhyDRINATE (DRAMAMINE) 50 MG tablet Take 50 mg by mouth every 8 (eight) hours as needed (vertigo).   ezetimibe (ZETIA) 10 MG tablet Take 1 tablet (10 mg total) by mouth daily.   furosemide (LASIX) 20 MG tablet Take 10 mg by mouth daily.   hydrocortisone (ANUSOL-HC) 2.5 % rectal cream Place 1 application rectally daily as needed for anal itching (anal rash).    hydroxypropyl methylcellulose / hypromellose (ISOPTO TEARS / GONIOVISC) 2.5 % ophthalmic solution Place 1 drop into both eyes at bedtime.    meclizine (ANTIVERT) 25 MG tablet Take 1 tablet (25 mg total) by mouth 3 (three) times daily as needed for dizziness.   nitroGLYCERIN (NITROSTAT) 0.4 MG SL tablet Place  1 tablet (0.4 mg total) under the tongue every 5 (five) minutes as needed for chest pain.   pantoprazole (PROTONIX) 40 MG tablet TAKE ONE TABLET BY MOUTH DAILY (Patient taking differently: daily as needed. )   simvastatin (ZOCOR) 80 MG tablet TAKE ONE TABLET BY MOUTH DAILY AT 6 PM (Patient taking differently: Take 80 mg by mouth daily at 6 PM. )     Allergies:   Adhesive [tape] and Latex   Social History   Tobacco Use   Smoking status: Never Smoker   Smokeless tobacco: Never Used  Substance Use Topics   Alcohol use: Yes    Alcohol/week: 0.0 standard drinks    Comment: socially   Drug use: No     Family Hx: The patient's family history includes Cancer (age of onset: 32) in his brother; Diabetes in his mother.  ROS:   Please see the history of present illness.     All other systems reviewed and are negative.   Prior CV studies:   The following studies were reviewed today:    Labs/Other Tests and  Data Reviewed:    EKG:  No ECG reviewed.  Recent Labs: 05/01/2018: TSH 1.052 12/17/2018: BUN 15; Creatinine, Ser 1.25; Hemoglobin 15.7; Platelets 186; Potassium 4.8; Sodium 137   Recent Lipid Panel Lab Results  Component Value Date/Time   CHOL 133 03/16/2017 02:43 PM   TRIG 141 03/16/2017 02:43 PM   HDL 41 03/16/2017 02:43 PM   CHOLHDL 3.2 03/16/2017 02:43 PM   CHOLHDL 3.0 12/19/2013 09:53 AM   LDLCALC 64 03/16/2017 02:43 PM    Wt Readings from Last 3 Encounters:  04/04/19 164 lb (74.4 kg)  11/17/18 164 lb (74.4 kg)  08/15/18 165 lb 12.8 oz (75.2 kg)     Objective:    Vital Signs:  BP 122/78    Pulse 94    Ht 5\' 9"  (1.753 m)    Wt 164 lb (74.4 kg)    BMI 24.22 kg/m    VITAL SIGNS:  reviewed a complete physical exam was not performed today since this was a virtual telemedicine phone visit  ASSESSMENT & PLAN:    1. Coronary artery disease-history of CAD status post CABG in 2006 with a LIMA to his LAD, vein to the RCA, circumflex and diagonal branches.  Cardiac cath performed July 13, 2010 revealed grafts to be patent with moderate aortoiliac disease.  He had a Myoview stress test performed 07/19/2018 that showed ischemia in the diagonal branch territory which led to heart cath 07/24/2018 revealing a 99% stenosis in the body of the vein graft to the diagonal branch which I stented using 2 overlapping synergy drug-eluting stents.  His RCA graft was occluded but his RCA white was widely patent.  His LIMA to his LAD was patent as well and the vein to the marginal branch had 70% smooth stenosis which did not appear to be hemodynamically significant.  His symptoms resolved after that.  Over the last 3 to 6 weeks has had recurrent chest heaviness while on the treadmill with some fatigue.  I am going to start him on low-dose Imdur I will see him back in 4 weeks.  If he is had no relief he will need repeat coronary angiography. 2. Hyperlipidemia-history of hyperlipidemia on Zetia and  simvastatin with lipid profile performed 02/22/2019 revealing total cholesterol 138, LDL 64 and HDL 43 3. History of essential hypertension with blood pressure measured today at 122/78. 4. Carotid artery disease-history of remote right carotid  endarterectomy performed by Dr. Doren Custard with carotid Dopplers performed 06/12/2018 revealing moderate left ICA stenosis.  This will be repeated on an annual basis.  COVID-19 Education: The signs and symptoms of COVID-19 were discussed with the patient and how to seek care for testing (follow up with PCP or arrange E-visit).  The importance of social distancing was discussed today.  Time:   Today, I have spent 7 minutes with the patient with telehealth technology discussing the above problems.     Medication Adjustments/Labs and Tests Ordered: Current medicines are reviewed at length with the patient today.  Concerns regarding medicines are outlined above.   Tests Ordered: No orders of the defined types were placed in this encounter.   Medication Changes: No orders of the defined types were placed in this encounter.   Follow Up:  In Person in 4 week(s)  Signed, Quay Burow, MD  04/04/2019 11:36 AM    [place]

## 2019-04-04 NOTE — Patient Instructions (Signed)
Medication Instructions:  Your physician has recommended you make the following change in your medication:   START ISOSORBIDE MONONITRATE (IMDUR) 15 MG BY MOUTH DAILY  If you need a refill on your cardiac medications before your next appointment, please call your pharmacy.   Lab work: NONE If you have labs (blood work) drawn today and your tests are completely normal, you will receive your results only by: [name] MyChart Message (if you have MyChart) OR . A paper copy in the mail If you have any lab test that is abnormal or we need to change your treatment, we will call you to review the results.  Testing/Procedures: NONE  Follow-Up: At [facility], you and your health needs are our priority.  As part of our continuing mission to provide you with exceptional heart care, we have created designated Provider Care Teams.  These Care Teams include your primary Cardiologist (physician) and Advanced Practice Providers (APPs -  Physician Assistants and Nurse Practitioners) who all work together to provide you with the care you need, when you need it. You will need a follow up appointment in APPROXIMATELY 4 weeks with Dr. Quay Burow.

## 2019-04-24 ENCOUNTER — Ambulatory Visit (HOSPITAL_COMMUNITY)
Admission: RE | Admit: 2019-04-24 | Discharge: 2019-04-24 | Disposition: A | Discharge status: Home / Self Care | Payer: Medicare Other | Source: Ambulatory Visit | Attending: Cardiology | Admitting: Cardiology

## 2019-04-24 ENCOUNTER — Other Ambulatory Visit: Payer: Self-pay

## 2019-04-24 DIAGNOSIS — I739 Peripheral vascular disease, unspecified: Secondary | ICD-10-CM | POA: Insufficient documentation

## 2019-05-07 ENCOUNTER — Other Ambulatory Visit: Payer: Self-pay

## 2019-05-07 ENCOUNTER — Encounter: Payer: Self-pay | Admitting: Cardiovascular Disease

## 2019-05-07 ENCOUNTER — Ambulatory Visit (INDEPENDENT_AMBULATORY_CARE_PROVIDER_SITE_OTHER): Payer: Medicare Other | Admitting: Cardiovascular Disease

## 2019-05-07 DIAGNOSIS — R072 Precordial pain: Secondary | ICD-10-CM | POA: Diagnosis not present

## 2019-05-07 DIAGNOSIS — I25118 Atherosclerotic heart disease of native coronary artery with other forms of angina pectoris: Secondary | ICD-10-CM

## 2019-05-07 DIAGNOSIS — I409 Acute myocarditis, unspecified: Secondary | ICD-10-CM

## 2019-05-07 DIAGNOSIS — I6523 Occlusion and stenosis of bilateral carotid arteries: Secondary | ICD-10-CM | POA: Diagnosis not present

## 2019-05-07 MED ORDER — METOPROLOL TARTRATE 25 MG PO TABS: 12.5000 mg | ORAL_TABLET | Freq: Two times a day (BID) | ORAL | 3 refills | Status: DC

## 2019-05-07 NOTE — Assessment & Plan Note (Signed)
History of CAD status post CABG in 2006 with a LIMA to his LAD, vein to RCA, circumflex and diagonal branches.  Cardiac cath performed 07/13/2010 revealed patent grafts with moderate aortoiliac disease.  He had a Myoview stress test performed 07/19/2018 that showed ischemia in any diagonal branch circumflex territory which led to heart cath 07/24/2018 revealing 99% stenosis in the body of the vein graft to the diagonal branch which I stented using 2 overlapping synergy drug-eluting stents.  RCA graft was occluded but inserted RCA was widely patent.  LIMA to his LAD was patent as well as the was not vein graft to the marginal branch with a 70% smooth stenosis which did not appear to be hemodynamically significant.  Symptoms resolved after that.  He has started to notice recurrent exertional chest heaviness especially while on the treadmill with some fatigue.  I started him on low-dose Imdur which he failed to take because of potential dizzy dizziness as a complication.  He is here for follow-up still complaining of some exertional chest tightness when walking on a treadmill.  I am going to begin him on low-dose metoprolol 12.5 mg p.o. twice daily.  Going to obtain a Lexiscan stress test.  Is possible that his diagonal branch stents have restenosed and/or he has had progression of disease in his obtuse marginal branch vein graft.

## 2019-05-07 NOTE — Progress Notes (Signed)
05/07/2019 [name]   1941/02/20  [id number]  Primary Physician [name], MD Primary Cardiologist: [name] MD FACP, [place], [place], [place]  HPI:  [name] is a 78 y.o. male thin and fit-appearing married Caucasian male with no children, who I last spoke to on the phone  04/04/2019 via a virtual telemedicine phone visit.[name] He has a history of CAD and PVOD. He had coronary artery bypass grafting in 2006 with a LIMA to his LAD, a vein to the RCA, circumflex, and diagonal branches. He did develop paroxysmal atrial fibrillation postoperatively. He has PVOD, status post right carotid endarterectomy performed by Dr. [name], as well as a known aortoiliac and left SFA disease, but he denies claudication except after walking seven or eight blocks in his left calf. His other problems include treated hypertension and dyslipidemia. He had a catheterization July 13, 2010, and found to have patent grafts with moderate aortoiliac disease. He does have moderate renal insufficiency followed by Dr. [name]. He has had laparoscopic cholecystectomy that was uncomplicated in the past.. He did have a negative Myoview stress test 8/14.[name] He does exercise on treadmill for 25 minutes a day with minimal claudication  He did have a left renal artery stent placed by Dr. [name] 2/19 at the request of Dr. [name] .He was hospitalized 05/01/2018 for 1 day because of dizziness and was found to have some symptomatic bradycardia. Beta-blocker was discontinued and subsequent event monitor showed no arrhythmias.  He had a Myoview stress test performed 07/19/2018 that showed ischemia in the the diagonal branch territory. This led to the outpatient cath performed 07/24/2018 revealing a 99% stenosis in the body of the vein graft to diagonal branch which was stented with 2 overlapping synergy drug-eluting stents. His RCA vein graft is occluded but his RCA was widely patent. His LIMA was  patent and the vein to the obtuse marginal branch had a 70% smooth stenosis which did not appear to be physiologically significant. His symptoms have significantly resolved though he still has some epigastric fullness.   Over the last 3 to 6 weeks prior to his phone visit on 04/04/2019, he noticed some exertional chest tightness while on the treadmill as well as some fatigue.  It should be noted that his symptoms resolved back in February after I stented his diagonal branch vein graft.  I have prescribed Imdur and was planning on seeing back in follow-up however he failed to fill the prescription because of fear of dizziness.  He continues to have exertional chest pressure while walking on the treadmill.   Current Meds  Medication Sig   acyclovir (ZOVIRAX) 400 MG tablet Take 1 tablet (400 mg total) by mouth 5 (five) times daily. (Patient taking differently: Take 400 mg by mouth 5 (five) times daily. Takes as needed for herpes)   aspirin EC 81 MG tablet Take 81 mg by mouth daily.    Choline Fenofibrate (FENOFIBRIC ACID) 45 MG CPDR TAKE 1 CAPSULE (45MG ) BY MOUTH AT BEDTIME (Patient taking differently: Take 45 mg by mouth at bedtime. )   clopidogrel (PLAVIX) 75 MG tablet Take 1 tablet (75 mg total) by mouth daily with breakfast.   dimenhyDRINATE (DRAMAMINE) 50 MG tablet Take 50 mg by mouth every 8 (eight) hours as needed (vertigo).   ezetimibe (ZETIA) 10 MG tablet Take 1 tablet (10 mg total) by mouth daily.   furosemide (LASIX) 20 MG tablet Take 10 mg by mouth daily.   hydrocortisone (ANUSOL-HC)  2.5 % rectal cream Place 1 application rectally daily as needed for anal itching (anal rash).    hydroxypropyl methylcellulose / hypromellose (ISOPTO TEARS / GONIOVISC) 2.5 % ophthalmic solution Place 1 drop into both eyes at bedtime.    isosorbide mononitrate (IMDUR) 30 MG 24 hr tablet Take 0.5 tablets (15 mg total) by mouth daily.   meclizine (ANTIVERT) 25 MG tablet Take 1 tablet (25 mg total) by  mouth 3 (three) times daily as needed for dizziness.   nitroGLYCERIN (NITROSTAT) 0.4 MG SL tablet Place 1 tablet (0.4 mg total) under the tongue every 5 (five) minutes as needed for chest pain.   pantoprazole (PROTONIX) 40 MG tablet TAKE ONE TABLET BY MOUTH DAILY (Patient taking differently: daily as needed. )   simvastatin (ZOCOR) 80 MG tablet TAKE ONE TABLET BY MOUTH DAILY AT 6 PM     Allergies  Allergen Reactions   Adhesive [Tape] Rash   Latex Rash    Social History   Socioeconomic History   Marital status: Married    Spouse name: [name]   Number of children: 0   Years of education: Not on file   Highest education level: Not on file  Occupational History   Occupation: Retired    Comment: Theme park manager: RETIRED  Social Designer, fashion/clothing strain: Not on file   Food insecurity    Worry: Not on file    Inability: Not on file   Transportation needs    Medical: Not on file    Non-medical: Not on file  Tobacco Use   Smoking status: Never Smoker   Smokeless tobacco: Never Used  Substance and Sexual Activity   Alcohol use: Yes    Alcohol/week: 0.0 standard drinks    Comment: socially   Drug use: No   Sexual activity: Yes  Lifestyle   Physical activity    Days per week: Not on file    Minutes per session: Not on file   Stress: Not on file  Relationships   Social connections    Talks on phone: Not on file    Gets together: Not on file    Attends religious service: Not on file    Active member of club or organization: Not on file    Attends meetings of clubs or organizations: Not on file    Relationship status: Not on file   Intimate partner violence    Fear of current or ex partner: Not on file    Emotionally abused: Not on file    Physically abused: Not on file    Forced sexual activity: Not on file  Other Topics Concern   Not on file  Social History Narrative   Last name is pronounced "cas ka" No caffeine use. Always uses  seat belts. Exercise:Moderate walks 30 min daily and rides a bike. Uses treadmill 20 mins everyday. Smoke alarm in home, No guns in the home.      Marital status:  Married x 12 years: happily married. Most of family in [place].      Children; none      Lives: with wife      Employment: retired.      Tobacco; never       Alcohol: wine after lunch daily.      Exercise:  Treadmill 20 minutes per day seven days per week.  Also walking throughout the day.      Advanced Directives: YES: FULL CODE no prolonged measures.  ADLs:  Independent with ADLs; no assistant devices.  Drives.      Review of Systems: General: negative for chills, fever, night sweats or weight changes.  Cardiovascular: negative for chest pain, dyspnea on exertion, edema, orthopnea, palpitations, paroxysmal nocturnal dyspnea or shortness of breath Dermatological: negative for rash Respiratory: negative for cough or wheezing Urologic: negative for hematuria Abdominal: negative for nausea, vomiting, diarrhea, bright red blood per rectum, melena, or hematemesis Neurologic: negative for visual changes, syncope, or dizziness All other systems reviewed and are otherwise negative except as noted above.    Blood pressure 140/66, pulse 82, height 5\' 9"  (1.753 m), weight 164 lb 6.4 oz (74.6 kg), SpO2 98 %.  General appearance: alert and no distress Neck: no adenopathy, no carotid bruit, no JVD, supple, symmetrical, trachea midline and thyroid not enlarged, symmetric, no tenderness/mass/nodules Lungs: clear to auscultation bilaterally Heart: regular rate and rhythm, S1, S2 normal, no murmur, click, rub or gallop Extremities: extremities normal, atraumatic, no cyanosis or edema Pulses: 2+ and symmetric Skin: Skin color, texture, turgor normal. No rashes or lesions Neurologic: Alert and oriented X 3, normal strength and tone. Normal symmetric reflexes. Normal coordination and gait  EKG not performed today  ASSESSMENT AND  PLAN:   Coronary artery disease History of CAD status post CABG in 2006 with a LIMA to his LAD, vein to RCA, circumflex and diagonal branches.  Cardiac cath performed 07/13/2010 revealed patent grafts with moderate aortoiliac disease.  He had a Myoview stress test performed 07/19/2018 that showed ischemia in any diagonal branch circumflex territory which led to heart cath 07/24/2018 revealing 99% stenosis in the body of the vein graft to the diagonal branch which I stented using 2 overlapping synergy drug-eluting stents.  RCA graft was occluded but inserted RCA was widely patent.  LIMA to his LAD was patent as well as the was not vein graft to the marginal branch with a 70% smooth stenosis which did not appear to be hemodynamically significant.  Symptoms resolved after that.  He has started to notice recurrent exertional chest heaviness especially while on the treadmill with some fatigue.  I started him on low-dose Imdur which he failed to take because of potential dizzy dizziness as a complication.  He is here for follow-up still complaining of some exertional chest tightness when walking on a treadmill.  I am going to begin him on low-dose metoprolol 12.5 mg p.o. twice daily.  Going to obtain a Lexiscan stress test.  Is possible that his diagonal branch stents have restenosed and/or he has had progression of disease in his obtuse marginal branch vein graft.      [name] MD FACP,FACC,FAHA, [facility] 05/07/2019 4:04 PM

## 2019-05-07 NOTE — Patient Instructions (Signed)
Medication Instructions:  Start taking 12.5mg  Metoprolol Twice Daily.  If you need a refill on your cardiac medications before your next appointment, please call your pharmacy.   Lab work: NONE  Testing/Procedures: Your physician has requested that you have a lexiscan myoview. For further information please visit HugeFiesta.tn. Please follow instruction sheet, as given.   Follow-Up: At [facility], you and your health needs are our priority.  As part of our continuing mission to provide you with exceptional heart care, we have created designated Provider Care Teams.  These Care Teams include your primary Cardiologist (physician) and Advanced Practice Providers (APPs -  Physician Assistants and Nurse Practitioners) who all work together to provide you with the care you need, when you need it. You may see Quay Burow, MD or one of the following Advanced Practice Providers on your designated Care Team:    Kerin Ransom, PA-C  [place], [place]  Coletta Memos, [place]  Your physician wants you to follow-up in: 6 months. You will receive a reminder letter in the mail two months in advance. If you don't receive a letter, please call our office to schedule the follow-up appointment.

## 2019-05-16 ENCOUNTER — Telehealth (HOSPITAL_COMMUNITY): Payer: Self-pay

## 2019-05-16 NOTE — Telephone Encounter (Signed)
Encounter complete. 

## 2019-05-17 ENCOUNTER — Encounter (HOSPITAL_COMMUNITY): Payer: Medicare Other

## 2019-05-18 ENCOUNTER — Ambulatory Visit (HOSPITAL_COMMUNITY)
Admission: RE | Admit: 2019-05-18 | Discharge: 2019-05-18 | Disposition: A | Discharge status: Home / Self Care | Payer: Medicare Other | Source: Ambulatory Visit | Attending: Cardiovascular Disease | Admitting: Cardiovascular Disease

## 2019-05-18 ENCOUNTER — Other Ambulatory Visit: Payer: Self-pay

## 2019-05-18 DIAGNOSIS — R072 Precordial pain: Secondary | ICD-10-CM | POA: Diagnosis not present

## 2019-05-18 DIAGNOSIS — I409 Acute myocarditis, unspecified: Secondary | ICD-10-CM | POA: Insufficient documentation

## 2019-05-18 MED ORDER — TECHNETIUM TC 99M TETROFOSMIN IV KIT
10.4000 | PACK | Freq: Once | INTRAVENOUS | Status: AC | PRN
  Administered 2019-05-18: 10.4 via INTRAVENOUS
  Filled 2019-05-18: qty 11

## 2019-05-18 MED ORDER — REGADENOSON 0.4 MG/5ML IV SOLN
0.4000 mg | Freq: Once | INTRAVENOUS | Status: AC
  Administered 2019-05-18: 0.4 mg via INTRAVENOUS

## 2019-05-18 MED ORDER — TECHNETIUM TC 99M TETROFOSMIN IV KIT
30.2000 | PACK | Freq: Once | INTRAVENOUS | Status: AC | PRN
  Administered 2019-05-18: 30.2 via INTRAVENOUS
  Filled 2019-05-18: qty 31

## 2019-05-22 ENCOUNTER — Telehealth: Payer: Self-pay

## 2019-05-22 LAB — MYOCARDIAL PERFUSION IMAGING
LV dias vol: 60 mL (ref 62–150)
LV sys vol: 22 mL
Peak HR: 97 {beats}/min
Rest HR: 54 {beats}/min
SDS: 10
SRS: 8
SSS: 17
TID: 1.14

## 2019-05-22 NOTE — Telephone Encounter (Signed)
Called and made appt for 12/10 at 3:00

## 2019-05-23 ENCOUNTER — Encounter: Payer: Self-pay | Admitting: Cardiovascular Disease

## 2019-05-23 ENCOUNTER — Other Ambulatory Visit: Payer: Self-pay

## 2019-05-23 ENCOUNTER — Ambulatory Visit (INDEPENDENT_AMBULATORY_CARE_PROVIDER_SITE_OTHER): Payer: Medicare Other | Admitting: Cardiovascular Disease

## 2019-05-23 VITALS — BP 138/68 | HR 64 | Temp 97.7°F | Ht 69.0 in | Wt 167.0 lb

## 2019-05-23 DIAGNOSIS — I6523 Occlusion and stenosis of bilateral carotid arteries: Secondary | ICD-10-CM

## 2019-05-23 DIAGNOSIS — Z01812 Encounter for preprocedural laboratory examination: Secondary | ICD-10-CM | POA: Diagnosis not present

## 2019-05-23 DIAGNOSIS — I25118 Atherosclerotic heart disease of native coronary artery with other forms of angina pectoris: Secondary | ICD-10-CM | POA: Diagnosis not present

## 2019-05-23 MED ORDER — SODIUM CHLORIDE 0.9% FLUSH: 3.0000 mL | Freq: Two times a day (BID) | INTRAVENOUS | Status: DC

## 2019-05-23 NOTE — Patient Instructions (Addendum)
Medication Instructions:  Your physician recommends that you continue on your current medications as directed. Please refer to the Current Medication list given to you today.  If you need a refill on your cardiac medications before your next appointment, please call your pharmacy.   Lab work: CBC, BMET If you have labs (blood work) drawn today and your tests are completely normal, you will receive your results only by: [place] (if you have MyChart) OR A paper copy in the mail If you have any lab test that is abnormal or we need to change your treatment, we will call you to review the results.  Testing/Procedures: Your physician has requested that you have a cardiac catheterization. Cardiac catheterization is used to diagnose and/or treat various heart conditions. Doctors may recommend this procedure for a number of different reasons. The most common reason is to evaluate chest pain. Chest pain can be a symptom of coronary artery disease (CAD), and cardiac catheterization can show whether plaque is narrowing or blocking your heart's arteries. This procedure is also used to evaluate the valves, as well as measure the blood flow and oxygen levels in different parts of your heart. For further information please visit HugeFiesta.tn. Please follow instruction sheet, as given.   Follow-Up: At [facility], you and your health needs are our priority.  As part of our continuing mission to provide you with exceptional heart care, we have created designated Provider Care Teams.  These Care Teams include your primary Cardiologist (physician) and Advanced Practice Providers (APPs -  Physician Assistants and Nurse Practitioners) who all work together to provide you with the care you need, when you need it. You may see Quay Burow, MD or one of the following Advanced Practice Providers on your designated Care Team:    Kerin Ransom, PA-C  [place], [place]  Coletta Memos, [place]   Any  Other Special Instructions Will Be Listed Below (If Applicable).  You are scheduled for a Cardiac Catheterization on Monday, December 14 with Dr. Quay Burow.  1. Please arrive at the [facility] (Main Entrance A) at [facility]: [address] [place], [place] [address] at 9:30 AM (This time is two hours before your procedure to ensure your preparation). Free valet parking service is available.   Special note: Every effort is made to have your procedure done on time. Please understand that emergencies sometimes delay scheduled procedures.  2. Diet: Do not eat solid foods after midnight.  The patient may have clear liquids until 5am upon the day of the procedure.  3. Labs: You will need to have blood drawn today.  4. Medication instructions in preparation for your procedure:  On the morning of your procedure, take your Plavix/Clopidogrel and any morning medicines NOT listed above.  You may use sips of water.  5. Plan for one night stay--bring personal belongings. 6. Bring a current list of your medications and current insurance cards. 7. You MUST have a responsible person to drive you home. 8. Someone MUST be with you the first 24 hours after you arrive home or your discharge will be delayed. 9. Please wear clothes that are easy to get on and off and wear slip-on shoes.  You Must have a COVID test prior to your procedure. This will be scheduled on 05/24/2019 at 12:10pm. This is a Drive Up Visit at the [facility] [address] [place]. Someone will direct you to the appropriate testing line. Stay in your car and someone  will be with you shortly.  Thank you for allowing [place] to care for you!   -- [place] [facility]

## 2019-05-23 NOTE — H&P (View-Only) (Signed)
Mr. Platt returns today for follow-up of his Myoview stress test performed 05/18/2019 because of new onset exertional chest pain.  This was markedly abnormal with ischemia in the diagonal branch territory.  I performed PCI and drug-eluting stenting using 2 drug-eluting stents on his diagonal branch vein graft 07/19/2018.  The vein graft to the RCA was occluded but the ostium is widely patent.  The LIMA was patent and the vein to the obtuse marginal branch did have 70% smooth stenosis which did not appear to be physiologically significant.  Based on his symptoms and abnormal stress test I suspect he has had recurrent disease in his diagonal branch vein graft.  We will proceed with outpatient diagnostic coronary angiography this coming Monday 12/14.    [name], M.D., [place], [facility], [place] [address] [place], [place]  [address]  [phone] 05/23/2019 4:07 PM

## 2019-05-23 NOTE — Progress Notes (Signed)
Mr. Peace returns today for follow-up of his Myoview stress test performed 05/18/2019 because of new onset exertional chest pain.  This was markedly abnormal with ischemia in the diagonal branch territory.  I performed PCI and drug-eluting stenting using 2 drug-eluting stents on his diagonal branch vein graft 07/19/2018.  The vein graft to the RCA was occluded but the ostium is widely patent.  The LIMA was patent and the vein to the obtuse marginal branch did have 70% smooth stenosis which did not appear to be physiologically significant.  Based on his symptoms and abnormal stress test I suspect he has had recurrent disease in his diagonal branch vein graft.  We will proceed with outpatient diagnostic coronary angiography this coming Monday 12/14.    [name], M.D., [place], [facility], [place] [address] [place], [place]  [address]  [phone] 05/23/2019 4:07 PM

## 2019-05-24 ENCOUNTER — Telehealth: Payer: Self-pay | Admitting: *Deleted

## 2019-05-24 ENCOUNTER — Telehealth: Payer: Self-pay

## 2019-05-24 ENCOUNTER — Other Ambulatory Visit (HOSPITAL_COMMUNITY)
Admission: RE | Admit: 2019-05-24 | Discharge: 2019-05-24 | Disposition: A | Discharge status: Home / Self Care | Payer: Medicare Other | Source: Ambulatory Visit | Attending: Cardiovascular Disease | Admitting: Cardiovascular Disease

## 2019-05-24 DIAGNOSIS — Z01812 Encounter for preprocedural laboratory examination: Secondary | ICD-10-CM | POA: Diagnosis present

## 2019-05-24 DIAGNOSIS — Z20828 Contact with and (suspected) exposure to other viral communicable diseases: Secondary | ICD-10-CM | POA: Insufficient documentation

## 2019-05-24 LAB — CBC
Hematocrit: 48.5 % (ref 37.5–51.0)
Hemoglobin: 16.5 g/dL (ref 13.0–17.7)
MCH: 29.6 pg (ref 26.6–33.0)
MCHC: 34 g/dL (ref 31.5–35.7)
MCV: 87 fL (ref 79–97)
Platelets: 166 10*3/uL (ref 150–450)
RBC: 5.58 x10E6/uL (ref 4.14–5.80)
RDW: 12.8 % (ref 11.6–15.4)
WBC: 4.9 10*3/uL (ref 3.4–10.8)

## 2019-05-24 LAB — BASIC METABOLIC PANEL
BUN/Creatinine Ratio: 11 (ref 10–24)
BUN: 17 mg/dL (ref 8–27)
CO2: 24 mmol/L (ref 20–29)
Calcium: 9.3 mg/dL (ref 8.6–10.2)
Chloride: 102 mmol/L (ref 96–106)
Creatinine, Ser: 1.56 mg/dL — ABNORMAL HIGH (ref 0.76–1.27)
GFR calc Af Amer: 49 mL/min/{1.73_m2} — ABNORMAL LOW (ref >59)
GFR calc non Af Amer: 42 mL/min/{1.73_m2} — ABNORMAL LOW (ref >59)
Glucose: 94 mg/dL (ref 65–99)
Potassium: 5.5 mmol/L — ABNORMAL HIGH (ref 3.5–5.2)
Sodium: 140 mmol/L (ref 134–144)

## 2019-05-24 NOTE — Telephone Encounter (Signed)
Called pt to let him know he needed pre-hydration for his procedure on 05/28/19. Pt agreed to be at hospital 4 hrs early.

## 2019-05-24 NOTE — Telephone Encounter (Addendum)
Pt contacted pre-catheterization scheduled at [facility] for: Monday May 28, 2019 11:30 AM Verified arrival time and place: [place] [facility]) at: 6:30 AM pre procedure hydration   No solid food after midnight prior to cath, clear liquids until 5 AM day of procedure. Contrast allergy:no  Hold Lasix-day before and day of procedure-GFR 42  Except hold medications AM meds can be  taken pre-cath with sip of water including: ASA 81 mg Plavix 75 mg  Confirmed patient has responsible adult to drive home post procedure and observe 24 hours after arriving home: yes  Currently, due to Covid-19 pandemic, only one support person will be allowed with patient. Must be the same support person for that patient's entire stay, will be screened and required to wear a mask. They will be asked to wait in the waiting room for the duration of the patient's stay.  Patients are required to wear a mask when they enter the hospital.      COVID-19 Pre-Screening Questions:  . In the past 7 to 10 days have you had a cough,  shortness of breath, headache, congestion, fever (100 or greater) body aches, chills, sore throat, or sudden loss of taste or sense of smell? no . Have you been around anyone with known Covid 19? no . Have you been around anyone who is awaiting Covid 19 test results in the past 7 to 10 days? no . Have you been around anyone who has been exposed to Covid 19, or has mentioned symptoms of Covid 19 within the past 7 to 10 days? no   I reviewed procedure/mask/visitor instructions, Covid-19 screening questions, with patient, he verbalized understanding, thanked me for call.

## 2019-05-24 NOTE — Telephone Encounter (Deleted)
Pt contacted pre-catheterization scheduled at [place] for: Verified arrival time and place: [place] Main Entrance A ([place]) at:   No solid food after midnight prior to cath, clear liquids until 5 AM day of procedure. Contrast allergy: Verified no diabetes medications.  AM meds can be  taken pre-cath with sip of water including: ASA 81 mg   Confirmed patient has responsible adult to drive home post procedure and observe 24 hours after arriving home:   Currently, due to Covid-19 pandemic, only one support person will be allowed with patient. Must be the same support person for that patient's entire stay, will be screened and required to wear a mask. They will be asked to wait in the waiting room for the duration of the patient's stay.  Patients are required to wear a mask when they enter the hospital.  

## 2019-05-25 LAB — NOVEL CORONAVIRUS, NAA (HOSP ORDER, SEND-OUT TO REF LAB; TAT 18-24 HRS): SARS-CoV-2, NAA: NOT DETECTED

## 2019-05-28 ENCOUNTER — Ambulatory Visit (HOSPITAL_COMMUNITY)
Admission: RE | Admit: 2019-05-28 | Discharge: 2019-05-28 | Disposition: A | Discharge status: Home / Self Care | Payer: Medicare Other | Attending: Cardiovascular Disease | Admitting: Cardiovascular Disease

## 2019-05-28 ENCOUNTER — Encounter: Payer: Self-pay | Admitting: Cardiology

## 2019-05-28 ENCOUNTER — Encounter (HOSPITAL_COMMUNITY)
Admission: RE | Disposition: A | Discharge status: Home / Self Care | Payer: Self-pay | Source: Home / Self Care | Attending: Cardiovascular Disease

## 2019-05-28 ENCOUNTER — Other Ambulatory Visit: Payer: Self-pay

## 2019-05-28 DIAGNOSIS — I2510 Atherosclerotic heart disease of native coronary artery without angina pectoris: Secondary | ICD-10-CM | POA: Diagnosis present

## 2019-05-28 DIAGNOSIS — Z7902 Long term (current) use of antithrombotics/antiplatelets: Secondary | ICD-10-CM | POA: Insufficient documentation

## 2019-05-28 DIAGNOSIS — I10 Essential (primary) hypertension: Secondary | ICD-10-CM | POA: Diagnosis not present

## 2019-05-28 DIAGNOSIS — Z79899 Other long term (current) drug therapy: Secondary | ICD-10-CM | POA: Insufficient documentation

## 2019-05-28 DIAGNOSIS — Z7982 Long term (current) use of aspirin: Secondary | ICD-10-CM | POA: Insufficient documentation

## 2019-05-28 DIAGNOSIS — E785 Hyperlipidemia, unspecified: Secondary | ICD-10-CM | POA: Insufficient documentation

## 2019-05-28 DIAGNOSIS — Z951 Presence of aortocoronary bypass graft: Secondary | ICD-10-CM | POA: Diagnosis not present

## 2019-05-28 DIAGNOSIS — I25118 Atherosclerotic heart disease of native coronary artery with other forms of angina pectoris: Secondary | ICD-10-CM | POA: Diagnosis not present

## 2019-05-28 DIAGNOSIS — I4510 Unspecified right bundle-branch block: Secondary | ICD-10-CM | POA: Insufficient documentation

## 2019-05-28 DIAGNOSIS — Z955 Presence of coronary angioplasty implant and graft: Secondary | ICD-10-CM | POA: Insufficient documentation

## 2019-05-28 DIAGNOSIS — I25718 Atherosclerosis of autologous vein coronary artery bypass graft(s) with other forms of angina pectoris: Secondary | ICD-10-CM | POA: Diagnosis not present

## 2019-05-28 DIAGNOSIS — I208 Other forms of angina pectoris: Secondary | ICD-10-CM | POA: Diagnosis present

## 2019-05-28 HISTORY — PX: LEFT HEART CATH AND CORS/GRAFTS ANGIOGRAPHY: CATH118250

## 2019-05-28 LAB — BASIC METABOLIC PANEL
Anion gap: 8 (ref 5–15)
BUN: 17 mg/dL (ref 8–23)
CO2: 27 mmol/L (ref 22–32)
Calcium: 8.5 mg/dL — ABNORMAL LOW (ref 8.9–10.3)
Chloride: 104 mmol/L (ref 98–111)
Creatinine, Ser: 1.49 mg/dL — ABNORMAL HIGH (ref 0.61–1.24)
GFR calc Af Amer: 52 mL/min — ABNORMAL LOW (ref >60)
GFR calc non Af Amer: 45 mL/min — ABNORMAL LOW (ref >60)
Glucose, Bld: 87 mg/dL (ref 70–99)
Potassium: 4.3 mmol/L (ref 3.5–5.1)
Sodium: 139 mmol/L (ref 135–145)

## 2019-05-28 SURGERY — LEFT HEART CATH AND CORS/GRAFTS ANGIOGRAPHY
Anesthesia: LOCAL

## 2019-05-28 MED ORDER — MIDAZOLAM HCL 2 MG/2ML IJ SOLN
INTRAMUSCULAR | Status: AC
  Filled 2019-05-28: qty 2

## 2019-05-28 MED ORDER — HEPARIN (PORCINE) IN NACL 1000-0.9 UT/500ML-% IV SOLN
INTRAVENOUS | Status: AC
  Filled 2019-05-28: qty 500

## 2019-05-28 MED ORDER — LIDOCAINE HCL (PF) 1 % IJ SOLN
INTRAMUSCULAR | Status: DC | PRN
  Administered 2019-05-28: 30 mL

## 2019-05-28 MED ORDER — FENTANYL CITRATE (PF) 100 MCG/2ML IJ SOLN
INTRAMUSCULAR | Status: DC | PRN
  Administered 2019-05-28: 25 ug via INTRAVENOUS

## 2019-05-28 MED ORDER — MIDAZOLAM HCL 2 MG/2ML IJ SOLN
INTRAMUSCULAR | Status: DC | PRN
  Administered 2019-05-28: 1 mg via INTRAVENOUS

## 2019-05-28 MED ORDER — FENTANYL CITRATE (PF) 100 MCG/2ML IJ SOLN
INTRAMUSCULAR | Status: AC
  Filled 2019-05-28: qty 2

## 2019-05-28 MED ORDER — SODIUM CHLORIDE 0.9 % WEIGHT BASED INFUSION: 1.0000 mL/kg/h | INTRAVENOUS | Status: DC

## 2019-05-28 MED ORDER — LIDOCAINE HCL (PF) 1 % IJ SOLN
INTRAMUSCULAR | Status: AC
  Filled 2019-05-28: qty 30

## 2019-05-28 MED ORDER — HEPARIN (PORCINE) IN NACL 1000-0.9 UT/500ML-% IV SOLN
INTRAVENOUS | Status: DC | PRN
  Administered 2019-05-28 (×2): 500 mL

## 2019-05-28 MED ORDER — ISOSORBIDE MONONITRATE ER 30 MG PO TB24: 30.0000 mg | ORAL_TABLET | Freq: Every day | ORAL | 11 refills | Status: DC

## 2019-05-28 MED ORDER — IOHEXOL 350 MG/ML SOLN
INTRAVENOUS | Status: DC | PRN
  Administered 2019-05-28: 85 mL

## 2019-05-28 MED ORDER — SODIUM CHLORIDE 0.9 % WEIGHT BASED INFUSION
3.0000 mL/kg/h | INTRAVENOUS | Status: AC
  Administered 2019-05-28: 08:00:00 3 mL/kg/h via INTRAVENOUS

## 2019-05-28 MED ORDER — SODIUM CHLORIDE 0.9 % IV SOLN: 250.0000 mL | INTRAVENOUS | Status: DC | PRN

## 2019-05-28 MED ORDER — SODIUM CHLORIDE 0.9% FLUSH: 3.0000 mL | INTRAVENOUS | Status: DC | PRN

## 2019-05-28 MED ORDER — ASPIRIN 81 MG PO CHEW: 81.0000 mg | CHEWABLE_TABLET | ORAL | Status: DC

## 2019-05-28 SURGICAL SUPPLY — 10 items
CATH DXT MULTI JL4 JR4 ANG PIG (CATHETERS) ×2 IMPLANT
CATH INFINITI 5 FR LCB (CATHETERS) ×2 IMPLANT
CATH INFINITI 5FR AL1 (CATHETERS) ×2 IMPLANT
CLOSURE MYNX CONTROL 5F (Vascular Products) ×2 IMPLANT
KIT HEART LEFT (KITS) ×2 IMPLANT
PACK CARDIAC CATHETERIZATION (CUSTOM PROCEDURE TRAY) ×2 IMPLANT
SHEATH PINNACLE 5F 10CM (SHEATH) ×2 IMPLANT
SYR MEDRAD MARK 7 150ML (SYRINGE) ×2 IMPLANT
TRANSDUCER W/STOPCOCK (MISCELLANEOUS) ×2 IMPLANT
WIRE EMERALD 3MM-J .035X150CM (WIRE) ×2 IMPLANT

## 2019-05-28 NOTE — Discharge Instructions (Signed)
Femoral Site Care This sheet gives you information about how to care for yourself after your procedure. Your health care provider may also give you more specific instructions. If you have problems or questions, contact your health care provider. What can I expect after the procedure? After the procedure, it is common to have:  Bruising that usually fades within 1-2 weeks.  Tenderness at the site. Follow these instructions at home: Wound care  Follow instructions from your health care provider about how to take care of your insertion site. Make sure you: ? Wash your hands with soap and water before you change your bandage (dressing). If soap and water are not available, use hand sanitizer. ? Change your dressing as told by your health care provider. ? Leave stitches (sutures), skin glue, or adhesive strips in place. These skin closures may need to stay in place for 2 weeks or longer. If adhesive strip edges start to loosen and curl up, you may trim the loose edges. Do not remove adhesive strips completely unless your health care provider tells you to do that.  Do not take baths, swim, or use a hot tub for 5 days.  You may shower 24-48 hours after the procedure . ? Gently wash the site with plain soap and water. ? Pat the area dry with a clean towel. ? Do not rub the site. This may cause bleeding.  Do not apply powder or lotion to the site. Keep the site clean and dry.  Check your femoral site every day for signs of infection. Check for: ? Redness, swelling, or pain. ? Fluid or blood. ? Warmth. ? Pus or a bad smell. Activity  For the first 2-3 days after your procedure, or as long as directed: ? Avoid climbing stairs as much as possible. ? Do not squat.  Do not lift anything that is heavier than 10 lb for 5 days.  Rest as directed. ? Avoid sitting for a long time without moving. Get up to take short walks every 1-2 hours.  Do not drive for 24 hours if you were given a medicine  to help you relax (sedative). General instructions  Take over-the-counter and prescription medicines only as told by your health care provider.  Keep all follow-up visits as told by your health care provider. This is important. Contact a health care provider if you have:  A fever or chills.  You have redness, swelling, or pain around your insertion site. Get help right away if:  The catheter insertion area swells very fast.  You pass out.  You suddenly start to sweat or your skin gets clammy.  The catheter insertion area is bleeding, and the bleeding does not stop when you hold steady pressure on the area.  The area near or just beyond the catheter insertion site becomes pale, cool, tingly, or numb. These symptoms may represent a serious problem that is an emergency. Do not wait to see if the symptoms will go away. Get medical help right away. Call your local emergency services (911 in the [place] Do not drive yourself to the hospital. Summary  After the procedure, it is common to have bruising that usually fades within 1-2 weeks.  Check your femoral site every day for signs of infection.  Do not lift anything that is heavier than 10 lb for 5 days.  This information is not intended to replace advice given to you by your health care provider. Make sure you discuss any questions you have with your  health care provider. Document Released: 02/01/2014 Document Revised: 06/13/2017 Document Reviewed: 06/13/2017 Elsevier Patient Education  2020 [employer].

## 2019-05-28 NOTE — Interval H&P Note (Signed)
History and Physical Interval Note:  05/28/2019 1:16 PM  [name]  has presented today for surgery, with the diagnosis of abnormal stress test.  The various methods of treatment have been discussed with the patient and family. After consideration of risks, benefits and other options for treatment, the patient has consented to  Procedure(s): LEFT HEART CATH AND CORS/GRAFTS ANGIOGRAPHY (N/A) as a surgical intervention.  The patient's history has been reviewed, patient examined, no change in status, stable for surgery.  I have reviewed the patient's chart and labs.  Questions were answered to the patient's satisfaction.     Quay Burow

## 2019-06-05 NOTE — Progress Notes (Signed)
Cardiology Clinic Note   Patient Name: [name] Date of Encounter: 06/06/2019  Primary Care Provider:  [name], MD Primary Cardiologist:  Quay Burow, MD  Patient Profile    [name] 78 year old male presents today for follow-up for his essential hypertension, coronary artery disease, and cardiac catheterization.  Past Medical History    Past Medical History:  Diagnosis Date  . Anemia, unspecified   . Basal cell carcinoma (BCC) of right forearm    [name]  . Benign paroxysmal positional vertigo   . Blood transfusion without reported diagnosis   . CAD (coronary artery disease)    HX CABG 2006 X 4; last nuc 11/2010 last cath 2012-wth 95% OM too small for intervention, treated medically   . Carcinoma in situ of skin, site unspecified   . Cerebrovascular disease, unspecified    hx CEA-RT; KNOWN AOTOILIAC 7 LT sfa disease  . Chronic renal insufficiency    glomerulonephritis [id number]  . Complication of anesthesia   . Congenital anomaly of tongue, unspecified   . Esophageal reflux   . Essential hypertension, benign   . Genital herpes, unspecified   . History of stress test 06/2010  . Hx of echocardiogram 01/2005   EF 55% normal study  . Lung mass   . PAF (paroxysmal atrial fibrillation) ([place])    post CABG, maintaining SR  . Peripheral vascular disease, unspecified ([place])   . PONV (postoperative nausea and vomiting)   . Pure hypercholesterolemia    Past Surgical History:  Procedure Laterality Date  . CARDIAC CATHETERIZATION  06/2010  . CAROTID ENDARTERECTOMY  2007   Right  . CHOLECYSTECTOMY  07/2010  . CORONARY ARTERY BYPASS GRAFT  05/12/2005   LIMA-LAD, VG-DIAG; VG-LCX,OM; VG-RCA  . CORONARY STENT INTERVENTION N/A 07/24/2018   Procedure: CORONARY STENT INTERVENTION;  Surgeon: [name], MD;  Location: [facility];  Service: Cardiovascular;  Laterality: N/A;  . IR RADIOLOGIST EVAL & MGMT  07/13/2017  . IR RADIOLOGIST  EVAL & MGMT  09/20/2017  . IR RENAL BILAT S&I MOD SED  08/09/2017  . IR TRANSCATH PLC STENT 1ST ART NOT LE CV CAR VERT CAR  08/09/2017  . IR US GUIDE VASC ACCESS RIGHT  08/09/2017  . LEFT HEART CATH AND CORS/GRAFTS ANGIOGRAPHY N/A 07/24/2018   Procedure: LEFT HEART CATH AND CORS/GRAFTS ANGIOGRAPHY;  Surgeon: [name], MD;  Location: [facility];  Service: Cardiovascular;  Laterality: N/A;  . LEFT HEART CATH AND CORS/GRAFTS ANGIOGRAPHY N/A 05/28/2019   Procedure: LEFT HEART CATH AND CORS/GRAFTS ANGIOGRAPHY;  Surgeon: [name], MD;  Location: [facility];  Service: Cardiovascular;  Laterality: N/A;    Allergies  Allergies  Allergen Reactions  . Adhesive [Tape] Rash  . Latex Rash    History of Present Illness    [name] has past medical history of coronary artery disease and PVOD.  He had a CABG in 2006 with LIMA to LAD, SVG to RCA, circumflex and diagonal branches.  He developed paroxysmal atrial fibrillation postoperatively.  He also has hypertension and dyslipidemia.  He underwent carotid endarterectomy by Dr. Doren Custard and has aortoiliac and left SFA  disease however, he denies claudication except when walking more than 8 blocks.  A catheterization 07/13/2010 showed patent grafts with moderate aortoiliac disease.  He sees Dr. [name] for his moderate renal insufficiency.  On 07/2017 he had a left renal artery stent placed by Dr. [name].  He has also had a  successful/uncomplicated laparoscopic cholecystectomy.  He underwent a Myoview stress test 8/14 which was negative.  He has been to the hospital on 11/19 for 1 day due to dizziness that was found to be related to symptomatic bradycardia.  His beta-blocker was discontinued at that time and a follow-up cardiac event monitor showed no further arrhythmias.  His Myoview stress test on 07/2018 ischemia in the diagonal branch territory.  He underwent cardiac catheterization 07/24/2018 which showed a 99% stenosis vein  graft to the diagonal branch which was stented with 2 overlapping DES.  His RCA vein graft was occluded but his RCA was widely patent.  His LIMA was patent and the vein to his obtuse marginal branch had a 70% stenosis but did not appear to be physiologically significant.  After the procedure he felt much better but still noted some epigastric fullness.  He saw Dr. Gwenlyn Found in follow-up virtually on 04/04/2019 and noted that he had had some exertional chest tightness while on the treadmill for the prior 3 to 6 weeks.  He was prescribed Imdur , however, he failed to fill the prescription because of fear of dizziness.  He continues to have chest pressure with exertional activities.  He underwent a repeat stress test 05/18/2019 which showed severe ischemia in the anterior, anterior lateral and inferior lateral walls.  He then underwent cardiac catheterization on 05/28/2019 which showed unchanged anatomy from his EF February cardiac catheterization.  Imdur 30 mg daily was started.  He presents the clinic today for follow-up and states he has not had any chest pain since his cardiac catheterization.  He also states that he has not started the Imdur 30 mg daily at this point.  He wanted to talk about medication side effects and his vertigo that he previously had.  He states he has been doing normal daily activities but has not started to walk on the treadmill yet.  He maintains a heart healthy low-sodium diet.  I have instructed him to begin walking and increase his activity level slowly.  I will start him on 15 mg of Imdur and we will see him back in 3 months.  He denies chest pain, shortness of breath, lower extremity edema, fatigue, palpitations, melena, hematuria, hemoptysis, diaphoresis, weakness, presyncope, syncope, orthopnea, and PND.  Home Medications    Prior to Admission medications   Medication Sig Start Date End Date Taking? Authorizing Provider  acyclovir (ZOVIRAX) 400 MG tablet Take 1 tablet (400 mg  total) by mouth 5 (five) times daily. Patient taking differently: Take 400 mg by mouth as needed (Herpes). Takes as needed for herpes 03/16/17   [name], MD  aspirin EC 81 MG tablet Take 81 mg by mouth daily at 12 noon.     [provider]  Choline Fenofibrate (FENOFIBRIC ACID) 45 MG CPDR TAKE 1 CAPSULE (45MG ) BY MOUTH AT BEDTIME Patient taking differently: Take 45 mg by mouth at bedtime.  05/17/18   Lendon Colonel, NP  clopidogrel (PLAVIX) 75 MG tablet Take 1 tablet (75 mg total) by mouth daily with breakfast. Patient taking differently: Take 75 mg by mouth daily.  08/16/18   [name], MD  dimenhyDRINATE (DRAMAMINE) 50 MG tablet Take 50 mg by mouth every 8 (eight) hours as needed (vertigo).    [provider]  ezetimibe (ZETIA) 10 MG tablet Take 1 tablet (10 mg total) by mouth daily. Patient taking differently: Take 10 mg by mouth every evening.  05/17/18   Lendon Colonel, NP  furosemide (LASIX) 20 MG tablet Take 10 mg by mouth daily.    [provider]  hydrocortisone (ANUSOL-HC) 2.5 % rectal cream Place 1 application rectally daily as needed for anal itching (anal rash).     [provider]  hydroxypropyl methylcellulose / hypromellose (ISOPTO TEARS / GONIOVISC) 2.5 % ophthalmic solution Place 1 drop into both eyes at bedtime.     [provider]  isosorbide mononitrate (IMDUR) 30 MG 24 hr tablet Take 1 tablet (30 mg total) by mouth daily. 05/28/19 05/27/20  Leanor Kail, PA  meclizine (ANTIVERT) 25 MG tablet Take 1 tablet (25 mg total) by mouth 3 (three) times daily as needed for dizziness. 12/17/18   Lacretia Leigh, MD  metoprolol tartrate (LOPRESSOR) 25 MG tablet Take 0.5 tablets (12.5 mg total) by mouth 2 (two) times daily. 05/07/19 08/05/19  [name], MD  nitroGLYCERIN (NITROSTAT) 0.4 MG SL tablet Place 1 tablet (0.4 mg total) under the tongue every 5 (five) minutes as needed for chest pain. 07/14/18   [name], MD  pantoprazole (PROTONIX) 40 MG tablet TAKE ONE TABLET BY MOUTH DAILY Patient taking differently: Take 40 mg by mouth daily as needed (heartburn).  03/12/19   [name], MD  simvastatin (ZOCOR) 80 MG tablet TAKE ONE TABLET BY MOUTH DAILY AT 6 PM Patient taking differently: Take 80 mg by mouth every evening.  12/13/18   [name], MD    Family History    Family History  Problem Relation Age of Onset  . Diabetes Mother   . Cancer Brother 56       colon cancer   He indicated that his mother is deceased. He indicated that his father is deceased. He indicated that his brother is alive.  Social History    Social History   Socioeconomic History  . Marital status: Married    Spouse name: [name]  . Number of children: 0  . Years of education: Not on file  . Highest education level: Not on file  Occupational History  . Occupation: Retired    Comment: Theme park manager: RETIRED  Tobacco Use  . Smoking status: Never Smoker  . Smokeless tobacco: Never Used  Substance and Sexual Activity  . Alcohol use: Yes    Alcohol/week: 0.0 standard drinks    Comment: socially  . Drug use: No  . Sexual activity: Yes  Other Topics Concern  . Not on file  Social History Narrative   Last name is pronounced "cas ka" No caffeine use. Always uses seat belts. Exercise:Moderate walks 30 min daily and rides a bike. Uses treadmill 20 mins everyday. Smoke alarm in home, No guns in the home.      Marital status:  Married x 12 years: happily married. Most of family in [place].      Children; none      Lives: with wife      Employment: retired.      Tobacco; never       Alcohol: wine after lunch daily.      Exercise:  Treadmill 20 minutes per day seven days per week.  Also walking throughout the day.      Advanced Directives: YES: FULL CODE no prolonged measures.      ADLs:  Independent with ADLs; no assistant devices.  Drives.    Social Determinants of Health    Financial Resource Strain:   . Difficulty of Paying Living Expenses: Not on file  Food Insecurity:   .  Worried About Charity fundraiser in the Last Year: Not on file  . Ran Out of Food in the Last Year: Not on file  Transportation Needs:   . Lack of Transportation (Medical): Not on file  . Lack of Transportation (Non-Medical): Not on file  Physical Activity:   . Days of Exercise per Week: Not on file  . Minutes of Exercise per Session: Not on file  Stress:   . Feeling of Stress : Not on file  Social Connections:   . Frequency of Communication with Friends and Family: Not on file  . Frequency of Social Gatherings with Friends and Family: Not on file  . Attends Religious Services: Not on file  . Active Member of Clubs or Organizations: Not on file  . Attends Archivist Meetings: Not on file  . Marital Status: Not on file  Intimate Partner Violence:   . Fear of Current or Ex-Partner: Not on file  . Emotionally Abused: Not on file  . Physically Abused: Not on file  . Sexually Abused: Not on file     Review of Systems    General:  No chills, fever, night sweats or weight changes.  Cardiovascular:  No chest pain, dyspnea on exertion, edema, orthopnea, palpitations, paroxysmal nocturnal dyspnea. Dermatological: No rash, lesions/masses Respiratory: No cough, dyspnea Urologic: No hematuria, dysuria Abdominal:   No nausea, vomiting, diarrhea, bright red blood per rectum, melena, or hematemesis Neurologic:  No visual changes, wkns, changes in mental status. All other systems reviewed and are otherwise negative except as noted above.  Physical Exam    VS:  BP 132/74   Pulse 64   Ht 5\' 9"  (1.753 m)   Wt 169 lb 14.4 oz (77.1 kg)   SpO2 100%   BMI 25.09 kg/m  , BMI Body mass index is 25.09 kg/m. GEN: Well nourished, well developed, in no acute distress. HEENT: normal. Neck: Supple, no JVD, carotid bruits, or masses. Cardiac: RRR, no murmurs, rubs, or gallops. No  clubbing, cyanosis, edema.  Radials/DP/PT 2+ and equal bilaterally.  Respiratory:  Respirations regular and unlabored, clear to auscultation bilaterally. GI: Soft, nontender, nondistended, BS + x 4. MS: no deformity or atrophy. Skin: warm and dry, no rash.  Right groin cath site clean dry intact no drainage.  Healing contusion. Neuro:  Strength and sensation are intact. Psych: Normal affect.  Accessory Clinical Findings    ECG personally reviewed by me today-none today-   EKG 05/28/2019 Sinus bradycardia incomplete right bundle branch block 59 bpm  Echocardiogram 05/21/2005 SUMMARY  - Overall left ventricular systolic function was normal. Left     ventricular ejection fraction was estimated to be 55 %.  - Aortic valve thickness was mildly increased.  IMPRESSIONS  - If clinically indicated, transesophageal echocardiography could     provide additional information.   Cardiac catheterization 05/28/2019   2nd Mrg lesion is 90% stenosed.  Prox LAD to Mid LAD lesion is 80% stenosed.  1st Diag lesion is 100% stenosed.  Origin lesion is 100% stenosed.  Dist Graft lesion is 70% stenosed.  Origin to Prox Graft lesion is 100% stenosed.  Diagnostic Dominance: Right  Intervention   IMPRESSION:[name] anatomy is pretty much unchanged compared to his last cath performed in February at which time a placed 2 drug-eluting stents and a highly diseased diagonal branch vein graft.  That vein graft is now occluded just beyond the ostium and the aorta.  I do not think it should be percutaneously  addressed.  This explains his exertional chest pain and the dense ischemic abnormality in the diagonal branch territory.  The vein graft to the obtuse marginal branch is patent with unchanged 70% smooth stenosis in the mid to distal shaft of the vein graft and the LIMA to the LAD is widely patent.  The native right is widely patent as well.  We will treat him medically.  I am going to  start Imdur 30 mg a day and we will see him back in the office either later this week or the week of December 28.  A femoral angiogram was performed and a MYNX closure device was successfully deployed achieving hemostasis.  The patient left lab in stable condition.  He will be discharged as an outpatient and will follow up with me either later this week or in the next several weeks.    Assessment & Plan   1.  Coronary artery disease-due to high risk Myoview and increased exertional chest pain he underwent cardiac catheterization 05/28/2019.  Unchanged anatomy from February 2020 cardiac catheterization.  See above cath report.  Continue medical management.  Catheterization reviewed with patient he expressed understanding. Start Imdur 15 mg tablet daily Continue aspirin 81 mg tablet daily Continue clopidogrel 75 mg tablet daily Continue metoprolol tartrate 12.5 mg twice daily Continue nitroglycerin 0.4 mg sublingual tablet as needed  Essential hypertension-BP today 132/74.  Well-controlled at home. Continue metoprolol tartrate 12.5 mg twice daily Continue furosemide 10 mg tablet daily Heart healthy low-sodium diet-salty 6 given Increase physical activity as tolerated  Hyperlipidemia-LDL 64 03/16/2017 Continue simvastatin 80 mg tablet daily Continue ezetimibe 10 mg tablet daily Heart healthy low-sodium high-fiber diet Increase physical activity as tolerated Direct LDL-patient has eaten today.   Disposition: Follow-up with Dr. Gwenlyn Found in 3 months.  Jossie Ng. [facility] [address] Office [phone] Fax [phone]

## 2019-06-06 ENCOUNTER — Encounter: Payer: Self-pay | Admitting: General Practice

## 2019-06-06 ENCOUNTER — Other Ambulatory Visit: Payer: Self-pay

## 2019-06-06 ENCOUNTER — Ambulatory Visit (INDEPENDENT_AMBULATORY_CARE_PROVIDER_SITE_OTHER): Payer: Medicare Other | Admitting: General Practice

## 2019-06-06 VITALS — BP 132/74 | HR 64 | Ht 69.0 in | Wt 169.9 lb

## 2019-06-06 DIAGNOSIS — E7800 Pure hypercholesterolemia, unspecified: Secondary | ICD-10-CM | POA: Diagnosis not present

## 2019-06-06 DIAGNOSIS — I25118 Atherosclerotic heart disease of native coronary artery with other forms of angina pectoris: Secondary | ICD-10-CM

## 2019-06-06 DIAGNOSIS — I10 Essential (primary) hypertension: Secondary | ICD-10-CM

## 2019-06-06 DIAGNOSIS — Z79899 Other long term (current) drug therapy: Secondary | ICD-10-CM | POA: Diagnosis not present

## 2019-06-06 MED ORDER — ISOSORBIDE MONONITRATE ER 30 MG PO TB24: 15.0000 mg | ORAL_TABLET | Freq: Every day | ORAL | 12 refills | Status: DC

## 2019-06-06 NOTE — Patient Instructions (Signed)
Medication Instructions:  DECREASE ISOSORBIDE 15MG  (1/2 TAB) DAILY If you need a refill on your cardiac medications before your next appointment, please call your pharmacy.  Labwork: DIRECT LDL TODAY HERE IN OUR OFFICE AT [facility]   If you have labs (blood work) drawn today and your tests are completely normal, you will receive your results only by: [name] MyChart Message (if you have MyChart) OR . A paper copy in the mail If you have any lab test that is abnormal or we need to change your treatment, we will call you to review the results.  Special Instructions: SLOWLY INCREASE PHYSICAL ACTIVITY AS TOLERATED  PLEASE READ AND FOLLOW SALTY 6 ATTACHED  Reduce your risk of getting COVID-19 With your heart disease it is especially important for people at increased risk of severe illness from COVID-19, and those who live with them, to protect themselves from getting COVID-19. The best way to protect yourself and to help reduce the spread of the virus that causes COVID-19 is to: [name] Limit your interactions with other people as much as possible. . Take precautions to prevent getting COVID-19 when you do interact with others. If you start feeling sick and think you may have COVID-19, get in touch with your healthcare provider within 24 hours.  Follow-Up: IN 3 months In Person You may see Quay Burow, MD or one of the following Advanced Practice Providers on your designated Care Team:  Coletta Memos, [place], PA-C  [place], [place].    At [facility], you and your health needs are our priority.  As part of our continuing mission to provide you with exceptional heart care, we have created designated Provider Care Teams.  These Care Teams include your primary Cardiologist (physician) and Advanced Practice Providers (APPs -  Physician Assistants and Nurse Practitioners) who all work together to provide you with the care you need, when you need it.  Thank you for choosing [facility] at  [facility]!!     Happy Holidays!!

## 2019-06-07 ENCOUNTER — Other Ambulatory Visit: Payer: Self-pay | Admitting: Adult Health

## 2019-06-07 ENCOUNTER — Other Ambulatory Visit: Payer: Self-pay | Admitting: Cardiovascular Disease

## 2019-06-07 LAB — LDL CHOLESTEROL, DIRECT: LDL Direct: 71 mg/dL (ref 0–99)

## 2019-06-07 NOTE — Telephone Encounter (Signed)
Patient pharmacy requesting a refill on Protonix. Patient seen 05/2019 and has follow up in 08/2019, refill request approved.

## 2019-06-07 NOTE — Telephone Encounter (Signed)
Fenofibric Acid refilled per pharmacy request. Next appt is in 08/2019.

## 2019-06-13 ENCOUNTER — Other Ambulatory Visit: Payer: Self-pay

## 2019-06-13 ENCOUNTER — Other Ambulatory Visit: Payer: Self-pay | Admitting: Cardiovascular Disease

## 2019-06-13 ENCOUNTER — Ambulatory Visit (HOSPITAL_COMMUNITY)
Admission: RE | Admit: 2019-06-13 | Discharge: 2019-06-13 | Disposition: A | Discharge status: Home / Self Care | Payer: Medicare Other | Source: Ambulatory Visit | Attending: Cardiovascular Disease | Admitting: Cardiovascular Disease

## 2019-06-13 DIAGNOSIS — I6529 Occlusion and stenosis of unspecified carotid artery: Secondary | ICD-10-CM | POA: Insufficient documentation

## 2019-06-18 ENCOUNTER — Other Ambulatory Visit: Payer: Self-pay

## 2019-06-18 DIAGNOSIS — I6529 Occlusion and stenosis of unspecified carotid artery: Secondary | ICD-10-CM

## 2019-06-20 DIAGNOSIS — N189 Chronic kidney disease, unspecified: Secondary | ICD-10-CM | POA: Diagnosis not present

## 2019-06-20 DIAGNOSIS — E785 Hyperlipidemia, unspecified: Secondary | ICD-10-CM | POA: Diagnosis not present

## 2019-06-20 DIAGNOSIS — N183 Chronic kidney disease, stage 3 unspecified: Secondary | ICD-10-CM | POA: Diagnosis not present

## 2019-06-22 ENCOUNTER — Ambulatory Visit: Payer: Medicare Other | Admitting: Family Medicine

## 2019-06-25 ENCOUNTER — Other Ambulatory Visit: Payer: Self-pay

## 2019-06-26 ENCOUNTER — Encounter: Payer: Self-pay | Admitting: Family Medicine

## 2019-06-26 ENCOUNTER — Ambulatory Visit (INDEPENDENT_AMBULATORY_CARE_PROVIDER_SITE_OTHER): Payer: Medicare Other | Admitting: Family Medicine

## 2019-06-26 VITALS — BP 118/68 | HR 70 | Temp 97.9°F | Ht 69.0 in | Wt 163.4 lb

## 2019-06-26 DIAGNOSIS — A6001 Herpesviral infection of penis: Secondary | ICD-10-CM | POA: Diagnosis not present

## 2019-06-26 DIAGNOSIS — I739 Peripheral vascular disease, unspecified: Secondary | ICD-10-CM

## 2019-06-26 DIAGNOSIS — I2510 Atherosclerotic heart disease of native coronary artery without angina pectoris: Secondary | ICD-10-CM | POA: Diagnosis not present

## 2019-06-26 DIAGNOSIS — N289 Disorder of kidney and ureter, unspecified: Secondary | ICD-10-CM | POA: Diagnosis not present

## 2019-06-26 DIAGNOSIS — E7800 Pure hypercholesterolemia, unspecified: Secondary | ICD-10-CM

## 2019-06-26 DIAGNOSIS — I10 Essential (primary) hypertension: Secondary | ICD-10-CM | POA: Diagnosis not present

## 2019-06-26 DIAGNOSIS — H6123 Impacted cerumen, bilateral: Secondary | ICD-10-CM | POA: Insufficient documentation

## 2019-06-26 DIAGNOSIS — I2583 Coronary atherosclerosis due to lipid rich plaque: Secondary | ICD-10-CM | POA: Diagnosis not present

## 2019-06-26 NOTE — Progress Notes (Signed)
New Patient Office Visit  Subjective:  Patient ID: EYOAB LADUCA, male    DOB: February 05, 1941  Age: 79 y.o. MRN: [id number]  CC:  Chief Complaint  Patient presents with  . Establish Care    Pt has questions about him recieving covid vaccine, not sure if he should.     HPI [name] presents for establishment of care by way of transfer because his primary care physician is leaving town he tells me.  Significant past medical history of hypertension, coronary artery disease peripheral vascular disease and chronic renal insufficiency.  His cardiologist and nephrologist are managing these issues for him.  LDL cholesterol runs in the 70s and his GFR averages in the low 40s.  GFR was checked by his nephrologist for an upcoming appointment next week and it was not available in the chart today.  History of basal cell carcinoma that he has seen by his dermatologist yearly.  History of ceruminosis and has his ears yearly by his ENT physician.  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  He lives with his wife.  They have no children.  Exercise capacity is limited due to what sounds like angina.  He is following this issue with his cardiologist.  Was unable to achieve dental care this past year but is planning so after the pandemic.  History of genital herpes that he treats episodically with acyclovir.  Past Medical History:  Diagnosis Date  . Anemia, unspecified   . Basal cell carcinoma (BCC) of right forearm    [name]  . Benign paroxysmal positional vertigo   . Blood transfusion without reported diagnosis   . CAD (coronary artery disease)    HX CABG 2006 X 4; last nuc 11/2010 last cath 2012-wth 95% OM too small for intervention, treated medically   . Carcinoma in situ of skin, site unspecified   . Cerebrovascular disease, unspecified    hx CEA-RT; KNOWN AOTOILIAC 7 LT sfa disease  . Chronic renal insufficiency    glomerulonephritis [id number]  . Complication of anesthesia   .  Congenital anomaly of tongue, unspecified   . Esophageal reflux   . Essential hypertension, benign   . Genital herpes, unspecified   . History of stress test 06/2010  . Hx of echocardiogram 01/2005   EF 55% normal study  . Lung mass   . PAF (paroxysmal atrial fibrillation) ([place])    post CABG, maintaining SR  . Peripheral vascular disease, unspecified ([place])   . PONV (postoperative nausea and vomiting)   . Pure hypercholesterolemia     Past Surgical History:  Procedure Laterality Date  . CARDIAC CATHETERIZATION  06/2010  . CAROTID ENDARTERECTOMY  2007   Right  . CHOLECYSTECTOMY  07/2010  . CORONARY ARTERY BYPASS GRAFT  05/12/2005   LIMA-LAD, VG-DIAG; VG-LCX,OM; VG-RCA  . CORONARY STENT INTERVENTION N/A 07/24/2018   Procedure: CORONARY STENT INTERVENTION;  Surgeon: [name], MD;  Location: [facility];  Service: Cardiovascular;  Laterality: N/A;  . IR RADIOLOGIST EVAL & MGMT  07/13/2017  . IR RADIOLOGIST EVAL & MGMT  09/20/2017  . IR RENAL BILAT S&I MOD SED  08/09/2017  . IR TRANSCATH PLC STENT 1ST ART NOT LE CV CAR VERT CAR  08/09/2017  . IR US GUIDE VASC ACCESS RIGHT  08/09/2017  . LEFT HEART CATH AND CORS/GRAFTS ANGIOGRAPHY N/A 07/24/2018   Procedure: LEFT HEART CATH AND CORS/GRAFTS ANGIOGRAPHY;  Surgeon: [name], MD;  Location: [facility];  Service: Cardiovascular;  Laterality: N/A;  . LEFT HEART CATH AND CORS/GRAFTS ANGIOGRAPHY N/A 05/28/2019   Procedure: LEFT HEART CATH AND CORS/GRAFTS ANGIOGRAPHY;  Surgeon: [name], MD;  Location: [facility];  Service: Cardiovascular;  Laterality: N/A;    Family History  Problem Relation Age of Onset  . Diabetes Mother   . Cancer Brother 51       colon cancer    Social History   Socioeconomic History  . Marital status: Married    Spouse name: [name]  . Number of children: 0  . Years of education: Not on file  . Highest education level: Not on file  Occupational History  . Occupation:  Retired    Comment: Theme park manager: RETIRED  Tobacco Use  . Smoking status: Never Smoker  . Smokeless tobacco: Never Used  Substance and Sexual Activity  . Alcohol use: Yes    Alcohol/week: 0.0 standard drinks    Comment: rarely  . Drug use: No  . Sexual activity: Yes  Other Topics Concern  . Not on file  Social History Narrative   Last name is pronounced "cas ka" No caffeine use. Always uses seat belts. Exercise:Moderate walks 30 min daily and rides a bike. Uses treadmill 20 mins everyday. Smoke alarm in home, No guns in the home.      Marital status:  Married x 12 years: happily married. Most of family in [place].      Children; none      Lives: with wife      Employment: retired.      Tobacco; never       Alcohol: wine after lunch daily.      Exercise:  Treadmill 20 minutes per day seven days per week.  Also walking throughout the day.      Advanced Directives: YES: FULL CODE no prolonged measures.      ADLs:  Independent with ADLs; no assistant devices.  Drives.    Social Determinants of Health   Financial Resource Strain:   . Difficulty of Paying Living Expenses: Not on file  Food Insecurity:   . Worried About Charity fundraiser in the Last Year: Not on file  . Ran Out of Food in the Last Year: Not on file  Transportation Needs:   . Lack of Transportation (Medical): Not on file  . Lack of Transportation (Non-Medical): Not on file  Physical Activity:   . Days of Exercise per Week: Not on file  . Minutes of Exercise per Session: Not on file  Stress:   . Feeling of Stress : Not on file  Social Connections:   . Frequency of Communication with Friends and Family: Not on file  . Frequency of Social Gatherings with Friends and Family: Not on file  . Attends Religious Services: Not on file  . Active Member of Clubs or Organizations: Not on file  . Attends Archivist Meetings: Not on file  . Marital Status: Not on file  Intimate Partner Violence:   .  Fear of Current or Ex-Partner: Not on file  . Emotionally Abused: Not on file  . Physically Abused: Not on file  . Sexually Abused: Not on file    ROS Review of Systems  Constitutional: Negative.   HENT: Negative.   Eyes: Negative for photophobia and visual disturbance.  Respiratory: Positive for chest tightness. Negative for shortness of breath and wheezing.   Cardiovascular: Positive for chest pain. Negative for palpitations and  leg swelling.  Gastrointestinal: Negative.   Endocrine: Negative for polyphagia and polyuria.  Genitourinary: Negative for difficulty urinating, frequency and urgency.  Musculoskeletal: Negative for gait problem and joint swelling.  Skin: Negative for wound.  Allergic/Immunologic: Negative for immunocompromised state.  Neurological: Negative for facial asymmetry and headaches.  Hematological: Does not bruise/bleed easily.  Psychiatric/Behavioral: Negative.     Objective:   Today's Vitals: BP 118/68   Pulse 70   Temp 97.9 F (36.6 C) (Tympanic)   Ht 5\' 9"  (1.753 m)   Wt 163 lb 6.4 oz (74.1 kg)   SpO2 97%   BMI 24.13 kg/m   Physical Exam Vitals and nursing note reviewed.  Constitutional:      General: He is not in acute distress.    Appearance: Normal appearance. He is normal weight. He is not ill-appearing, toxic-appearing or diaphoretic.  HENT:     Head: Normocephalic and atraumatic.     Right Ear: External ear normal. There is impacted cerumen.     Left Ear: External ear normal. There is impacted cerumen.  Eyes:     General: No scleral icterus.       Right eye: No discharge.        Left eye: No discharge.     Extraocular Movements: Extraocular movements intact.     Conjunctiva/sclera: Conjunctivae normal.     Pupils: Pupils are equal, round, and reactive to light.  Cardiovascular:     Rate and Rhythm: Normal rate and regular rhythm.  Pulmonary:     Effort: Pulmonary effort is normal. No respiratory distress.     Breath sounds: Normal  breath sounds. No stridor.  Abdominal:     General: Bowel sounds are normal.  Musculoskeletal:     Cervical back: No rigidity or tenderness.     Right lower leg: No edema.     Left lower leg: No edema.  Lymphadenopathy:     Cervical: No cervical adenopathy.  Skin:    General: Skin is warm and dry.     Coloration: Skin is not jaundiced.  Neurological:     Mental Status: He is alert and oriented to person, place, and time.  Psychiatric:        Mood and Affect: Mood normal.        Behavior: Behavior normal.     Assessment & Plan:   Problem List Items Addressed This Visit      Cardiovascular and Mediastinum   Essential hypertension, benign - Primary   Coronary artery disease due to lipid rich plaque   Peripheral arterial disease (HCC)     Nervous and Auditory   Ceruminosis, bilateral     Genitourinary   Renal insufficiency   Herpes simplex infection of penis     Other   Pure hypercholesterolemia      Outpatient Encounter Medications as of 06/26/2019  Medication Sig  . acyclovir (ZOVIRAX) 400 MG tablet Take 1 tablet (400 mg total) by mouth 5 (five) times daily. (Patient taking differently: Take 400 mg by mouth as needed (Herpes). Takes as needed for herpes)  . aspirin EC 81 MG tablet Take 81 mg by mouth daily at 12 noon.   . Choline Fenofibrate (FENOFIBRIC ACID) 45 MG CPDR TAKE ONE CAPSULE BY MOUTH EVERY NIGHT AT BEDTIME  . clopidogrel (PLAVIX) 75 MG tablet Take 1 tablet (75 mg total) by mouth daily with breakfast. (Patient taking differently: Take 75 mg by mouth daily. )  . dimenhyDRINATE (DRAMAMINE) 50 MG tablet Take 50 mg  by mouth every 8 (eight) hours as needed (vertigo).  . ezetimibe (ZETIA) 10 MG tablet Take 1 tablet (10 mg total) by mouth daily. (Patient taking differently: Take 10 mg by mouth every evening. )  . furosemide (LASIX) 20 MG tablet Take 10 mg by mouth daily.  . hydrocortisone (ANUSOL-HC) 2.5 % rectal cream Place 1 application rectally daily as needed  for anal itching (anal rash).   . hydroxypropyl methylcellulose / hypromellose (ISOPTO TEARS / GONIOVISC) 2.5 % ophthalmic solution Place 1 drop into both eyes at bedtime.   . isosorbide mononitrate (IMDUR) 30 MG 24 hr tablet Take 0.5 tablets (15 mg total) by mouth daily.  . meclizine (ANTIVERT) 25 MG tablet Take 1 tablet (25 mg total) by mouth 3 (three) times daily as needed for dizziness.  . metoprolol tartrate (LOPRESSOR) 25 MG tablet Take 0.5 tablets (12.5 mg total) by mouth 2 (two) times daily.  . nitroGLYCERIN (NITROSTAT) 0.4 MG SL tablet Place 1 tablet (0.4 mg total) under the tongue every 5 (five) minutes as needed for chest pain.  . pantoprazole (PROTONIX) 40 MG tablet TAKE ONE TABLET BY MOUTH DAILY  . simvastatin (ZOCOR) 80 MG tablet TAKE ONE TABLET BY MOUTH DAILY AT 6 PM (Patient not taking: No sig reported)   Facility-Administered Encounter Medications as of 06/26/2019  Medication  . sodium chloride flush (NS) 0.9 % injection 3 mL  Advised that he go ahead and receive the flu vaccine in the Covid vaccine when they become available to him.  He has refused the flu vaccine over the years and we discussed the importance of him having the flu vaccine as well.  He continues to decline.  He will not accept the Covid vaccine until he discusses it with his cardiologist nephrologist.  Follow-up: Return in about 6 months (around 12/24/2019).   Libby Maw, MD

## 2019-07-10 ENCOUNTER — Other Ambulatory Visit: Payer: Self-pay | Admitting: Adult Health

## 2019-07-10 DIAGNOSIS — I10 Essential (primary) hypertension: Secondary | ICD-10-CM

## 2019-07-10 DIAGNOSIS — I739 Peripheral vascular disease, unspecified: Secondary | ICD-10-CM

## 2019-07-11 DIAGNOSIS — I701 Atherosclerosis of renal artery: Secondary | ICD-10-CM | POA: Diagnosis not present

## 2019-07-11 DIAGNOSIS — D751 Secondary polycythemia: Secondary | ICD-10-CM | POA: Diagnosis not present

## 2019-07-11 DIAGNOSIS — N058 Unspecified nephritic syndrome with other morphologic changes: Secondary | ICD-10-CM | POA: Diagnosis not present

## 2019-07-11 DIAGNOSIS — I7090 Unspecified atherosclerosis: Secondary | ICD-10-CM | POA: Diagnosis not present

## 2019-07-11 DIAGNOSIS — I129 Hypertensive chronic kidney disease with stage 1 through stage 4 chronic kidney disease, or unspecified chronic kidney disease: Secondary | ICD-10-CM | POA: Diagnosis not present

## 2019-07-11 DIAGNOSIS — R42 Dizziness and giddiness: Secondary | ICD-10-CM | POA: Diagnosis not present

## 2019-07-11 DIAGNOSIS — E785 Hyperlipidemia, unspecified: Secondary | ICD-10-CM | POA: Diagnosis not present

## 2019-07-11 DIAGNOSIS — I739 Peripheral vascular disease, unspecified: Secondary | ICD-10-CM | POA: Diagnosis not present

## 2019-07-11 DIAGNOSIS — N183 Chronic kidney disease, stage 3 unspecified: Secondary | ICD-10-CM | POA: Diagnosis not present

## 2019-07-11 DIAGNOSIS — N057 Unspecified nephritic syndrome with diffuse crescentic glomerulonephritis: Secondary | ICD-10-CM | POA: Diagnosis not present

## 2019-08-03 DIAGNOSIS — H6123 Impacted cerumen, bilateral: Secondary | ICD-10-CM | POA: Diagnosis not present

## 2019-08-11 ENCOUNTER — Other Ambulatory Visit: Payer: Self-pay | Admitting: Cardiovascular Disease

## 2019-08-12 ENCOUNTER — Ambulatory Visit: Payer: Medicare Other | Attending: Internal Medicine

## 2019-08-12 DIAGNOSIS — Z23 Encounter for immunization: Secondary | ICD-10-CM | POA: Insufficient documentation

## 2019-08-12 NOTE — Progress Notes (Signed)
   Covid-19 Vaccination Clinic  Name:  [name]    MRN: [id number] DOB: January 04, 1941  08/12/2019  Mr. [name] was observed post Covid-19 immunization for 15 minutes without incidence. He was provided with Vaccine Information Sheet and instruction to access the V-Safe system.   Mr. [name] was instructed to call 911 with any severe reactions post vaccine: [name] Difficulty breathing  . Swelling of your face and throat  . A fast heartbeat  . A bad rash all over your body  . Dizziness and weakness    Immunizations Administered    Name Date Dose VIS Date Route   Pfizer COVID-19 Vaccine 08/12/2019  4:13 PM 0.3 mL 05/25/2019 Intramuscular   Manufacturer: [place]   Lot: [id number]   [place]: [id number]

## 2019-08-13 DIAGNOSIS — L821 Other seborrheic keratosis: Secondary | ICD-10-CM | POA: Diagnosis not present

## 2019-08-13 DIAGNOSIS — L820 Inflamed seborrheic keratosis: Secondary | ICD-10-CM | POA: Diagnosis not present

## 2019-08-13 DIAGNOSIS — Z85828 Personal history of other malignant neoplasm of skin: Secondary | ICD-10-CM | POA: Diagnosis not present

## 2019-08-13 NOTE — Telephone Encounter (Signed)
Rx request sent to pharmacy.  

## 2019-08-19 ENCOUNTER — Other Ambulatory Visit: Payer: Self-pay | Admitting: Cardiovascular Disease

## 2019-09-03 ENCOUNTER — Other Ambulatory Visit: Payer: Self-pay | Admitting: General Practice

## 2019-09-04 ENCOUNTER — Ambulatory Visit (INDEPENDENT_AMBULATORY_CARE_PROVIDER_SITE_OTHER): Payer: Medicare Other | Admitting: General Practice

## 2019-09-04 ENCOUNTER — Other Ambulatory Visit: Payer: Self-pay

## 2019-09-04 ENCOUNTER — Encounter: Payer: Self-pay | Admitting: General Practice

## 2019-09-04 VITALS — BP 126/66 | HR 72 | Ht 69.0 in | Wt 169.0 lb

## 2019-09-04 DIAGNOSIS — Z79899 Other long term (current) drug therapy: Secondary | ICD-10-CM | POA: Diagnosis not present

## 2019-09-04 DIAGNOSIS — I2510 Atherosclerotic heart disease of native coronary artery without angina pectoris: Secondary | ICD-10-CM | POA: Diagnosis not present

## 2019-09-04 DIAGNOSIS — I10 Essential (primary) hypertension: Secondary | ICD-10-CM

## 2019-09-04 DIAGNOSIS — I25118 Atherosclerotic heart disease of native coronary artery with other forms of angina pectoris: Secondary | ICD-10-CM

## 2019-09-04 DIAGNOSIS — E7800 Pure hypercholesterolemia, unspecified: Secondary | ICD-10-CM

## 2019-09-04 MED ORDER — ISOSORBIDE MONONITRATE ER 60 MG PO TB24: 60.0000 mg | ORAL_TABLET | Freq: Every day | ORAL | 1 refills | Status: DC

## 2019-09-04 MED ORDER — NITROGLYCERIN 0.4 MG SL SUBL: 0.4000 mg | SUBLINGUAL_TABLET | SUBLINGUAL | 3 refills | Status: DC | PRN

## 2019-09-04 MED ORDER — ISOSORBIDE MONONITRATE ER 30 MG PO TB24: 30.0000 mg | ORAL_TABLET | Freq: Every day | ORAL | 6 refills | Status: DC

## 2019-09-04 NOTE — Patient Instructions (Addendum)
Medication Instructions:  INCREASE ISOSORBIDE 60MG  DAILY *If you need a refill on your cardiac medications before your next appointment, please call your pharmacy*  Lab Work: FASTING LIPID AND LFT WHEN YOU ARE FASTING  If you have labs (blood work) drawn today and your tests are completely normal, you will receive your results only by:  [place] (if you have MyChart) OR A paper copy in the mail.  If you have any lab test that is abnormal or we need to change your treatment, we will call you to review the results.  Other Instructions TRY TO PACE YOURSELF WITH YOUR PHYSICAL ACTIVITY  PLEASE FOLLOW HEART HEALTHY DIET-ATTACHED  Follow-Up: Your next appointment:  KEEP SCHEDULED APPOINTMENT   In Person with Quay Burow, MD.  At [facility], you and your health needs are our priority.  As part of our continuing mission to provide you with exceptional heart care, we have created designated Provider Care Teams.  These Care Teams include your primary Cardiologist (physician) and Advanced Practice Providers (APPs -  Physician Assistants and Nurse Practitioners) who all work together to provide you with the care you need, when you need it.     HEART HEALTHY DIET Getting too much fat and cholesterol in your diet may cause health problems. Choosing the right foods helps keep your fat and cholesterol at normal levels. This can keep you from getting certain diseases.  What are tips for following this plan? Meal planning  At meals, divide your plate into four equal parts: ? Fill one-half of your plate with vegetables and green salads. ? Fill one-fourth of your plate with whole grains. ? Fill one-fourth of your plate with low-fat (lean) protein foods.  Eat fish that is high in omega-3 fats at least two times a week. This includes mackerel, tuna, sardines, and salmon.  Eat foods that are high in fiber, such as whole grains, beans, apples, broccoli, carrots, peas, and barley. General  tips   Work with your doctor to lose weight if you need to.  Avoid: ? Foods with added sugar. ? Fried foods. ? Foods with partially hydrogenated oils.  Limit alcohol intake to no more than 1 drink a day for nonpregnant women and 2 drinks a day for men. One drink equals 12 oz of beer, 5 oz of wine, or 1 oz of hard liquor. Reading food labels  Check food labels for: ? Trans fats. ? Partially hydrogenated oils. ? Saturated fat (g) in each serving. ? Cholesterol (mg) in each serving. ? Fiber (g) in each serving.  Choose foods with healthy fats, such as: ? Monounsaturated fats. ? Polyunsaturated fats. ? Omega-3 fats.  Choose grain products that have whole grains. Look for the word "whole" as the first word in the ingredient list. Cooking  Cook foods using low-fat methods. These include baking, boiling, grilling, and broiling.  Eat more home-cooked foods. Eat at restaurants and buffets less often.  Avoid cooking using saturated fats, such as butter, cream, palm oil, palm kernel oil, and coconut oil. Recommended foods  Fruits  All fresh, canned (in natural juice), or frozen fruits. Vegetables  Fresh or frozen vegetables (raw, steamed, roasted, or grilled). Green salads. Grains  Whole grains, such as whole wheat or whole grain breads, crackers, cereals, and pasta. Unsweetened oatmeal, bulgur, barley, quinoa, or brown rice. Corn or whole wheat flour tortillas. Meats and other protein foods  Ground beef (85% or leaner), grass-fed beef, or beef trimmed of fat. Skinless chicken or [place]. Ground  chicken or [place]. Pork trimmed of fat. All fish and seafood. Egg whites. Dried beans, peas, or lentils. Unsalted nuts or seeds. Unsalted canned beans. Nut butters without added sugar or oil. Dairy  Low-fat or nonfat dairy products, such as skim or 1% milk, 2% or reduced-fat cheeses, low-fat and fat-free ricotta or cottage cheese, or plain low-fat and nonfat yogurt. Fats and oils  Tub  margarine without trans fats. Light or reduced-fat mayonnaise and salad dressings. Avocado. Olive, canola, sesame, or safflower oils. The items listed above may not be a complete list of foods and beverages you can eat. Contact a dietitian for more information. Foods to avoid Fruits  Canned fruit in heavy syrup. Fruit in cream or butter sauce. Fried fruit. Vegetables  Vegetables cooked in cheese, cream, or butter sauce. Fried vegetables. Grains  White bread. White pasta. White rice. Cornbread. Bagels, pastries, and croissants. Crackers and snack foods that contain trans fat and hydrogenated oils. Meats and other protein foods  Fatty cuts of meat. Ribs, chicken wings, bacon, sausage, bologna, salami, chitterlings, fatback, hot dogs, bratwurst, and packaged lunch meats. Liver and organ meats. Whole eggs and egg yolks. Chicken and [place] with skin. Fried meat. Dairy  Whole or 2% milk, cream, half-and-half, and cream cheese. Whole milk cheeses. Whole-fat or sweetened yogurt. Full-fat cheeses. Nondairy creamers and whipped toppings. Processed cheese, cheese spreads, and cheese curds. Beverages  Alcohol. Sugar-sweetened drinks such as sodas, lemonade, and fruit drinks. Fats and oils  Butter, stick margarine, lard, shortening, ghee, or bacon fat. Coconut, palm kernel, and palm oils. Sweets and desserts  Corn syrup, sugars, honey, and molasses. Candy. Jam and jelly. Syrup. Sweetened cereals. Cookies, pies, cakes, donuts, muffins, and ice cream. The items listed above may not be a complete list of foods and beverages you should avoid. Contact a dietitian for more information. Summary  Choosing the right foods helps keep your fat and cholesterol at normal levels. This can keep you from getting certain diseases.  At meals, fill one-half of your plate with vegetables and green salads.  Eat high-fiber foods, like whole grains, beans, apples, carrots, peas, and barley.  Limit added sugar,  saturated fats, alcohol, and fried foods. This information is not intended to replace advice given to you by your health care provider. Make sure you discuss any questions you have with your health care provider.

## 2019-09-04 NOTE — Progress Notes (Signed)
Cardiology Clinic Note   Patient Name: [name] Date of Encounter: 09/04/2019  Primary Care Provider:  Erling Cruz, MD Primary Cardiologist:  Jose Burow, MD  Patient Profile    [name] 79 year old male presents today for follow-up for his essential hypertension, coronary artery disease, and cardiac catheterization.  Past Medical History    Past Medical History:  Diagnosis Date  . Anemia, unspecified   . Basal cell carcinoma (BCC) of right forearm    [name]  . Benign paroxysmal positional vertigo   . Blood transfusion without reported diagnosis   . CAD (coronary artery disease)    HX CABG 2006 X 4; last nuc 11/2010 last cath 2012-wth 95% OM too small for intervention, treated medically   . Carcinoma in situ of skin, site unspecified   . Cerebrovascular disease, unspecified    hx CEA-RT; KNOWN AOTOILIAC 7 LT sfa disease  . Chronic renal insufficiency    glomerulonephritis [id number]  . Complication of anesthesia   . Congenital anomaly of tongue, unspecified   . Esophageal reflux   . Essential hypertension, benign   . Genital herpes, unspecified   . History of stress test 06/2010  . Hx of echocardiogram 01/2005   EF 55% normal study  . Lung mass   . PAF (paroxysmal atrial fibrillation) ([place])    post CABG, maintaining SR  . Peripheral vascular disease, unspecified ([place])   . PONV (postoperative nausea and vomiting)   . Pure hypercholesterolemia    Past Surgical History:  Procedure Laterality Date  . CARDIAC CATHETERIZATION  06/2010  . CAROTID ENDARTERECTOMY  2007   Right  . CHOLECYSTECTOMY  07/2010  . CORONARY ARTERY BYPASS GRAFT  05/12/2005   LIMA-LAD, VG-DIAG; VG-LCX,OM; VG-RCA  . CORONARY STENT INTERVENTION N/A 07/24/2018   Procedure: CORONARY STENT INTERVENTION;  Surgeon: Jose Harp, MD;  Location: [facility];  Service: Cardiovascular;  Laterality: N/A;  . IR RADIOLOGIST EVAL & MGMT  07/13/2017  . IR RADIOLOGIST EVAL  & MGMT  09/20/2017  . IR RENAL BILAT S&I MOD SED  08/09/2017  . IR TRANSCATH PLC STENT 1ST ART NOT LE CV CAR VERT CAR  08/09/2017  . IR US GUIDE VASC ACCESS RIGHT  08/09/2017  . LEFT HEART CATH AND CORS/GRAFTS ANGIOGRAPHY N/A 07/24/2018   Procedure: LEFT HEART CATH AND CORS/GRAFTS ANGIOGRAPHY;  Surgeon: Jose Harp, MD;  Location: [facility];  Service: Cardiovascular;  Laterality: N/A;  . LEFT HEART CATH AND CORS/GRAFTS ANGIOGRAPHY N/A 05/28/2019   Procedure: LEFT HEART CATH AND CORS/GRAFTS ANGIOGRAPHY;  Surgeon: Jose Harp, MD;  Location: [facility];  Service: Cardiovascular;  Laterality: N/A;    Allergies  Allergies  Allergen Reactions  . Adhesive [Tape] Rash  . Latex Rash    History of Present Illness    Mr. [name] has past medical history of coronary artery disease and PVOD.  He had a CABG in 2006 with LIMA to LAD, SVG to RCA, circumflex and diagonal branches.  He developed paroxysmal atrial fibrillation postoperatively.  He also has hypertension and dyslipidemia.  He underwent carotid endarterectomy by Dr. Doren Price and has aortoiliac and left SFA  disease however, he denies claudication except when walking more than 8 blocks.  A catheterization 07/13/2010 showed patent grafts with moderate aortoiliac disease.  He sees Dr. Marval Price for his moderate renal insufficiency.  On 07/2017 he had a left renal artery stent placed by Dr. Kathlene Price.  He has also had a  successful/uncomplicated laparoscopic cholecystectomy.  He underwent a Myoview stress test 8/14 which was negative.  He has been to the hospital on 11/19 for 1 day due to dizziness that was Price to be related to symptomatic bradycardia.  His beta-blocker was discontinued at that time and a follow-up cardiac event monitor showed no further arrhythmias.  His Myoview stress test on 07/2018 ischemia in the diagonal branch territory.  He underwent cardiac catheterization 07/24/2018 which showed a 99% stenosis vein graft  to the diagonal branch which was stented with 2 overlapping DES.  His RCA vein graft was occluded but his RCA was widely patent.  His LIMA was patent and the vein to his obtuse marginal branch had a 70% stenosis but did not appear to be physiologically significant.  After the procedure he felt much better but still noted some epigastric fullness.  He saw Dr. Gwenlyn Price in follow-up virtually on 04/04/2019 and noted that he had had some exertional chest tightness while on the treadmill for the prior 3 to 6 weeks.  He was prescribed Imdur , however, he failed to fill the prescription because of fear of dizziness.  He continues to have chest pressure with exertional activities.  He underwent a repeat stress test 05/18/2019 which showed severe ischemia in the anterior, anterior lateral and inferior lateral walls.  He then underwent cardiac catheterization on 05/28/2019 which showed unchanged anatomy from his EF February cardiac catheterization.  Imdur 30 mg daily was started.  He presented to the clinic 06/06/2019 for follow-up and stated he had not had any chest pain since his cardiac catheterization.  He also stated that he had not started the Imdur 30 mg daily at this point.  He wanted to talk about medication side effects and his vertigo that he previously had.  He stated he had been doing normal daily activities but had not started to walk on the treadmill yet.  He maintained a heart healthy low-sodium diet.  I  instructed him to begin walking and increase his activity level slowly.  I will started him on 15 mg of Imdur.   He presents for follow-up today and states he has continued his physical activity and is now up to 6-minute walks on his treadmill 2 times per day.  He titrated up his isosorbide from 15 mg to 30 mg however, he still notices some chest fullness as he is increasing his physical activity.  He is eating a heart healthy low-sodium diet and has received his first COVID-19 vaccination.  I will  increase his Imdur to 60 mg daily and have him keep his scheduled appointment with Dr. Gwenlyn Price.  Order lipid panel and LFTs.  He will have his BMP followed up with nephrology.  Today he denies chest pain, shortness of breath, lower extremity edema, fatigue, palpitations, melena, hematuria, hemoptysis, diaphoresis, weakness, presyncope, syncope, orthopnea, and PND.  Home Medications    Prior to Admission medications   Medication Sig Start Date End Date Taking? Authorizing Provider  acyclovir (ZOVIRAX) 400 MG tablet Take 1 tablet (400 mg total) by mouth 5 (five) times daily. Patient taking differently: Take 400 mg by mouth as needed (Herpes). Takes as needed for herpes 03/16/17   [name], MD  aspirin EC 81 MG tablet Take 81 mg by mouth daily at 12 noon.     [provider]  Choline Fenofibrate (FENOFIBRIC ACID) 45 MG CPDR TAKE ONE CAPSULE BY MOUTH EVERY NIGHT AT BEDTIME 06/07/19   Deberah Pelton, NP  clopidogrel (  PLAVIX) 75 MG tablet TAKE ONE TABLET BY MOUTH DAILY WITH BREAKFAST 08/20/19   Jose Harp, MD  dimenhyDRINATE (DRAMAMINE) 50 MG tablet Take 50 mg by mouth every 8 (eight) hours as needed (vertigo).    [provider]  ezetimibe (ZETIA) 10 MG tablet TAKE ONE TABLET BY MOUTH DAILY 07/10/19   Lendon Colonel, NP  furosemide (LASIX) 20 MG tablet Take 10 mg by mouth daily.    [provider]  hydrocortisone (ANUSOL-HC) 2.5 % rectal cream Place 1 application rectally daily as needed for anal itching (anal rash).     [provider]  hydroxypropyl methylcellulose / hypromellose (ISOPTO TEARS / GONIOVISC) 2.5 % ophthalmic solution Place 1 drop into both eyes at bedtime.     [provider]  isosorbide mononitrate (IMDUR) 30 MG 24 hr tablet Take 0.5 tablets (15 mg total) by mouth daily. 06/06/19 06/05/20  Deberah Pelton, NP  meclizine (ANTIVERT) 25 MG tablet Take 1 tablet (25 mg total) by mouth 3 (three) times daily as needed for  dizziness. 12/17/18   Lacretia Leigh, MD  metoprolol tartrate (LOPRESSOR) 25 MG tablet Take 0.5 tablets (12.5 mg total) by mouth 2 (two) times daily. 05/07/19 08/05/19  Jose Harp, MD  nitroGLYCERIN (NITROSTAT) 0.4 MG SL tablet Place 1 tablet (0.4 mg total) under the tongue every 5 (five) minutes as needed for chest pain. 07/14/18   Jose Harp, MD  pantoprazole (PROTONIX) 40 MG tablet TAKE ONE TABLET BY MOUTH DAILY 06/07/19   Deberah Pelton, NP  simvastatin (ZOCOR) 80 MG tablet TAKE ONE TABLET BY MOUTH DAILY AT 6 PM Patient not taking: No sig reported 12/13/18   Jose Harp, MD    Family History    Family History  Problem Relation Age of Onset  . Diabetes Mother   . Cancer Brother 33       colon cancer   He indicated that his mother is deceased. He indicated that his father is deceased. He indicated that his brother is alive.  Social History    Social History   Socioeconomic History  . Marital status: Married    Spouse name: [name]  . Number of children: 0  . Years of education: Not on file  . Highest education level: Not on file  Occupational History  . Occupation: Retired    Comment: Theme park manager: RETIRED  Tobacco Use  . Smoking status: Never Smoker  . Smokeless tobacco: Never Used  Substance and Sexual Activity  . Alcohol use: Yes    Alcohol/week: 0.0 standard drinks    Comment: rarely  . Drug use: No  . Sexual activity: Yes  Other Topics Concern  . Not on file  Social History Narrative   Last name is pronounced "cas ka" No caffeine use. Always uses seat belts. Exercise:Moderate walks 30 min daily and rides a bike. Uses treadmill 20 mins everyday. Smoke alarm in home, No guns in the home.      Marital status:  Married x 12 years: happily married. Most of family in [place].      Children; none      Lives: with wife      Employment: retired.      Tobacco; never       Alcohol: wine after lunch daily.      Exercise:  Treadmill 20 minutes per  day seven days per week.  Also walking throughout the day.      Advanced Directives: YES:  FULL CODE no prolonged measures.      ADLs:  Independent with ADLs; no assistant devices.  Drives.    Social Determinants of Health   Financial Resource Strain:   . Difficulty of Paying Living Expenses:   Food Insecurity:   . Worried About Charity fundraiser in the Last Year:   . Arboriculturist in the Last Year:   Transportation Needs:   . Film/video editor (Medical):   [name] Lack of Transportation (Non-Medical):   Physical Activity:   . Days of Exercise per Week:   . Minutes of Exercise per Session:   Stress:   . Feeling of Stress :   Social Connections:   . Frequency of Communication with Friends and Family:   . Frequency of Social Gatherings with Friends and Family:   . Attends Religious Services:   . Active Member of Clubs or Organizations:   . Attends Archivist Meetings:   [name] Marital Status:   Intimate Partner Violence:   . Fear of Current or Ex-Partner:   . Emotionally Abused:   [name] Physically Abused:   . Sexually Abused:      Review of Systems    General:  No chills, fever, night sweats or weight changes.  Cardiovascular:  No chest pain, dyspnea on exertion, edema, orthopnea, palpitations, paroxysmal nocturnal dyspnea. Dermatological: No rash, lesions/masses Respiratory: No cough, dyspnea Urologic: No hematuria, dysuria Abdominal:   No nausea, vomiting, diarrhea, bright red blood per rectum, melena, or hematemesis Neurologic:  No visual changes, wkns, changes in mental status. All other systems reviewed and are otherwise negative except as noted above.  Physical Exam    VS:  BP 126/66 (BP Location: Left Arm, Patient Position: Sitting, Cuff Size: Normal)   Pulse 72   Ht 5\' 9"  (1.753 m)   Wt 169 lb (76.7 kg)   BMI 24.96 kg/m  , BMI Body mass index is 24.96 kg/m. GEN: Well nourished, well developed, in no acute distress. HEENT: normal. Neck: Supple, no JVD,  carotid bruits, or masses. Cardiac: RRR, no murmurs, rubs, or gallops. No clubbing, cyanosis, edema.  Radials/DP/PT 2+ and equal bilaterally.  Respiratory:  Respirations regular and unlabored, clear to auscultation bilaterally. GI: Soft, nontender, nondistended, BS + x 4. MS: no deformity or atrophy. Skin: warm and dry, no rash. Neuro:  Strength and sensation are intact. Psych: Normal affect.  Accessory Clinical Findings    ECG personally reviewed by me today-none today.  EKG 05/28/2019 Sinus bradycardia incomplete right bundle branch block 59 bpm  Echocardiogram 05/21/2005 SUMMARY  - Overall left ventricular systolic function was normal. Left     ventricular ejection fraction was estimated to be 55 %.  - Aortic valve thickness was mildly increased.  IMPRESSIONS  - If clinically indicated, transesophageal echocardiography could     provide additional information.   Cardiac catheterization 05/28/2019   2nd Mrg lesion is 90% stenosed.  Prox LAD to Mid LAD lesion is 80% stenosed.  1st Diag lesion is 100% stenosed.  Origin lesion is 100% stenosed.  Dist Graft lesion is 70% stenosed.  Origin to Prox Graft lesion is 100% stenosed.  Diagnostic Dominance: Right  Intervention   IMPRESSION:Mr Sharaf anatomy is pretty much unchanged compared to his last cath performed in February at which time a placed 2 drug-eluting stents and a highly diseased diagonal branch vein graft. That vein graft is now occluded just beyond the ostium and the aorta. I do not think it should be  percutaneously addressed. This explains his exertional chest pain and the dense ischemic abnormality in the diagonal branch territory. The vein graft to the obtuse marginal branch is patent with unchanged 70% smooth stenosis in the mid to distal shaft of the vein graft and the LIMA to the LAD is widely patent. The native right is widely patent as well. We will treat him medically. I am  going to start Imdur 30 mg a day and we will see him back in the office either later this week or the week of December 28. A femoral angiogram was performed and a MYNX closure device was successfully deployed achieving hemostasis. The patient left lab in stable condition. He will be discharged as an outpatient and will follow up with me either later this week or in the next several weeks.  Carotid ultrasound 06/13/2019 Summary:  Right Carotid: Velocities in the right ICA are consistent with a 1-39%  stenosis.  The right ICA velocities remain within normal range and  stable compared to the previous exam, s/p endarterectomy.   Left Carotid: Velocities in the left ICA are consistent with a 40-59%  stenosis. The left ICA velocities are elevated and stable compared to  The previous exam.  No change from prior study.  Assessment & Plan   1.  Coronary artery disease-having chest fullness with increased physical activity.  Due to high risk Myoview and increased exertional chest pain he underwent cardiac catheterization 05/28/2019.  Unchanged anatomy from February 2020 cardiac catheterization.  See above cath report.  Continue medical management.  Catheterization reviewed with patient he expressed understanding. Increase Imdur 60 mg tablet daily Continue aspirin 81 mg tablet daily Continue clopidogrel 75 mg tablet daily Continue metoprolol tartrate 12.5 mg twice daily Continue nitroglycerin 0.4 mg sublingual tablet as needed  Essential hypertension-BP today  126/76.  Well-controlled at home. Continue metoprolol tartrate 12.5 mg twice daily Continue furosemide 10 mg tablet daily Heart healthy low-sodium diet-salty 6 given Increase physical activity as tolerated BMP per nephrology  Hyperlipidemia-LDL 71 06/06/2019 Continue simvastatin 80 mg tablet daily Continue ezetimibe 10 mg tablet daily  Heart healthy low-sodium high-fiber diet Increase physical activity as tolerated Repeat lipid panel  and LFTs.-Lab slip given   Disposition: Follow-up with Dr. Gwenlyn Price in May as scheduled.    Jossie Ng. [facility] [place] [address] Office [phone] Fax [phone]

## 2019-09-07 ENCOUNTER — Telehealth: Payer: Self-pay | Admitting: Cardiovascular Disease

## 2019-09-07 NOTE — Telephone Encounter (Signed)
Will route to PharmD to advise of medications reactions if any. Thank you!

## 2019-09-07 NOTE — Telephone Encounter (Signed)
Both are fine with his current medications

## 2019-09-07 NOTE — Telephone Encounter (Signed)
Contacted patient, advised of message. Patient verbalized understanding.

## 2019-09-07 NOTE — Telephone Encounter (Signed)
Pt c/o medication issue:  1. Name of Medication: Amoxicillin 500mg  and Fast Relief Tylenol  2. How are you currently taking this medication (dosage and times per day)? Took one Tylenol and has not picked up the Amoxicillin yet  3. Are you having a reaction (difficulty breathing--STAT)? no  4. What is your medication issue? Patient was prescribed this medication by his dentist and wants to make sure this will not interfere with his cardiac medications.

## 2019-09-11 ENCOUNTER — Ambulatory Visit: Payer: Medicare Other

## 2019-09-21 DIAGNOSIS — Z79899 Other long term (current) drug therapy: Secondary | ICD-10-CM | POA: Diagnosis not present

## 2019-09-21 DIAGNOSIS — N183 Chronic kidney disease, stage 3 unspecified: Secondary | ICD-10-CM | POA: Diagnosis not present

## 2019-09-21 DIAGNOSIS — I25118 Atherosclerotic heart disease of native coronary artery with other forms of angina pectoris: Secondary | ICD-10-CM | POA: Diagnosis not present

## 2019-09-21 DIAGNOSIS — E7800 Pure hypercholesterolemia, unspecified: Secondary | ICD-10-CM | POA: Diagnosis not present

## 2019-09-21 DIAGNOSIS — N189 Chronic kidney disease, unspecified: Secondary | ICD-10-CM | POA: Diagnosis not present

## 2019-09-21 DIAGNOSIS — I10 Essential (primary) hypertension: Secondary | ICD-10-CM | POA: Diagnosis not present

## 2019-09-21 DIAGNOSIS — E785 Hyperlipidemia, unspecified: Secondary | ICD-10-CM | POA: Diagnosis not present

## 2019-09-22 LAB — LIPID PANEL
Chol/HDL Ratio: 3.3 ratio (ref 0.0–5.0)
Cholesterol, Total: 137 mg/dL (ref 100–199)
HDL: 42 mg/dL (ref >39)
LDL Chol Calc (NIH): 71 mg/dL (ref 0–99)
Triglycerides: 138 mg/dL (ref 0–149)
VLDL Cholesterol Cal: 24 mg/dL (ref 5–40)

## 2019-09-22 LAB — HEPATIC FUNCTION PANEL
ALT: 21 IU/L (ref 0–44)
AST: 28 IU/L (ref 0–40)
Albumin: 4.8 g/dL — ABNORMAL HIGH (ref 3.7–4.7)
Alkaline Phosphatase: 77 IU/L (ref 39–117)
Bilirubin Total: 0.4 mg/dL (ref 0.0–1.2)
Bilirubin, Direct: 0.11 mg/dL (ref 0.00–0.40)
Total Protein: 6.5 g/dL (ref 6.0–8.5)

## 2019-09-24 ENCOUNTER — Ambulatory Visit: Payer: Medicare Other | Attending: Internal Medicine

## 2019-09-24 DIAGNOSIS — Z23 Encounter for immunization: Secondary | ICD-10-CM

## 2019-09-24 NOTE — Progress Notes (Signed)
   Covid-19 Vaccination Clinic  Name:  [name]    MRN: [id number] DOB: [birth year]  09/24/2019  [name] was observed post Covid-19 immunization for 15 minutes without incident. He was provided with Vaccine Information Sheet and instruction to access the V-Safe system.   [name] was instructed to call 911 with any severe reactions post vaccine: [name] Difficulty breathing  . Swelling of face and throat  . A fast heartbeat  . A bad rash all over body  . Dizziness and weakness   Immunizations Administered    Name Date Dose VIS Date Route   Pfizer COVID-19 Vaccine 09/24/2019 10:44 AM 0.3 mL 05/25/2019 Intramuscular   Manufacturer: [place]   Lot: [id number]   [place]: [id number]

## 2019-10-05 ENCOUNTER — Telehealth: Payer: Self-pay | Admitting: General Practice

## 2019-10-05 NOTE — Telephone Encounter (Signed)
Dr. Sharlet Salina is okay with taking on this patient. He will call and set up the appointment.

## 2019-10-23 ENCOUNTER — Encounter: Payer: Self-pay | Admitting: Internal Medicine

## 2019-10-23 ENCOUNTER — Other Ambulatory Visit: Payer: Self-pay

## 2019-10-23 ENCOUNTER — Ambulatory Visit (INDEPENDENT_AMBULATORY_CARE_PROVIDER_SITE_OTHER): Payer: Medicare Other | Admitting: Internal Medicine

## 2019-10-23 VITALS — BP 132/76 | HR 82 | Temp 98.1°F | Ht 69.0 in | Wt 166.0 lb

## 2019-10-23 DIAGNOSIS — A6001 Herpesviral infection of penis: Secondary | ICD-10-CM

## 2019-10-23 DIAGNOSIS — N059 Unspecified nephritic syndrome with unspecified morphologic changes: Secondary | ICD-10-CM

## 2019-10-23 DIAGNOSIS — I6523 Occlusion and stenosis of bilateral carotid arteries: Secondary | ICD-10-CM | POA: Diagnosis not present

## 2019-10-23 DIAGNOSIS — H900 Conductive hearing loss, bilateral: Secondary | ICD-10-CM | POA: Diagnosis not present

## 2019-10-23 DIAGNOSIS — I2583 Coronary atherosclerosis due to lipid rich plaque: Secondary | ICD-10-CM | POA: Diagnosis not present

## 2019-10-23 DIAGNOSIS — N1831 Chronic kidney disease, stage 3a: Secondary | ICD-10-CM | POA: Diagnosis not present

## 2019-10-23 DIAGNOSIS — I2510 Atherosclerotic heart disease of native coronary artery without angina pectoris: Secondary | ICD-10-CM | POA: Diagnosis not present

## 2019-10-23 DIAGNOSIS — I10 Essential (primary) hypertension: Secondary | ICD-10-CM | POA: Diagnosis not present

## 2019-10-23 MED ORDER — ACYCLOVIR 400 MG PO TABS: 400.0000 mg | ORAL_TABLET | Freq: Every day | ORAL | 2 refills | Status: DC

## 2019-10-23 NOTE — Assessment & Plan Note (Signed)
Refill acyclovir to use as needed for flares.

## 2019-10-23 NOTE — Assessment & Plan Note (Signed)
Stage 3a at this time and BP good, no DM. Following with nephrology and due to glomerulonephritis described separately.

## 2019-10-23 NOTE — Assessment & Plan Note (Signed)
No hearing aid does not feel he needs at this time.

## 2019-10-23 NOTE — Assessment & Plan Note (Signed)
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

## 2019-10-23 NOTE — Assessment & Plan Note (Signed)
BP at goal and home readings good with average around 120/77.

## 2019-10-23 NOTE — Patient Instructions (Signed)
We will get the records from your past doctor.   

## 2019-10-23 NOTE — Assessment & Plan Note (Signed)
Seeing cardiology and they are currently adjusting medications, taking imdur, lasix, zetia, fenofibrate, simvastatin, metoprolol, ASA 81 mg, plavix.

## 2019-10-23 NOTE — Progress Notes (Signed)
   Subjective:   Patient ID: [name], male    DOB: 19-Nov-1940, 79 y.o.   MRN: [id number]  HPI The patient is a 79 YO man coming in for transfer of care from another provider due to location. He does have several medical problems which include CAD (recent adjustment of medication to imdur 60 mg daily by cardiology, 2 recent cath in the last year or so, with new stents, still getting some mild chest pressure with exercise and is working on building back to 30 minutes a day treadmill and is at 10 minutes in the morning and 5 minutes afternoon currently) and carotid artery disease (gets yearly imaging, had CEA done >10 years ago, denies headaches or stroke symptoms) and glomerulonephritis ((BP at goal, no DM, ANCA negative pauci immune per 2009 biopsy was on treatment for several months with stabilization of Creatinine around 1.4-1.6, seeing nephrology with labs at least every 6 months) and CKD stage 3 (BP at goal, no DM, ANCA negative pauci immune per 2009 biopsy was on treatment for several months with stabilization of Creatinine around 1.4-1.6, seeing nephrology with labs at least every 6 months).   PMH, [facility], social history reviewed and updated  Review of Systems  Constitutional: Negative.   HENT: Positive for hearing loss.   Eyes: Negative.   Respiratory: Positive for chest tightness. Negative for cough and shortness of breath.   Cardiovascular: Negative for chest pain, palpitations and leg swelling.  Gastrointestinal: Negative for abdominal distention, abdominal pain, constipation, diarrhea, nausea and vomiting.  Musculoskeletal: Negative.   Skin: Negative.   Neurological: Negative.   Psychiatric/Behavioral: Negative.     Objective:  Physical Exam Constitutional:      Appearance: He is well-developed.  HENT:     Head: Normocephalic and atraumatic.  Cardiovascular:     Rate and Rhythm: Normal rate and regular rhythm.  Pulmonary:     Effort: Pulmonary effort is normal. No  respiratory distress.     Breath sounds: Normal breath sounds. No wheezing or rales.  Abdominal:     General: Bowel sounds are normal. There is no distension.     Palpations: Abdomen is soft.     Tenderness: There is no abdominal tenderness. There is no rebound.  Musculoskeletal:     Cervical back: Normal range of motion.  Skin:    General: Skin is warm and dry.  Neurological:     Mental Status: He is alert and oriented to person, place, and time.     Coordination: Coordination normal.     Vitals:   10/23/19 0952  BP: 132/76  Pulse: 82  Temp: 98.1 F (36.7 C)  SpO2: 98%  Weight: 166 lb (75.3 kg)  Height: 5\' 9"  (1.753 m)    This visit occurred during the SARS-CoV-2 public health emergency.  Safety protocols were in place, including screening questions prior to the visit, additional usage of staff PPE, and extensive cleaning of exam room while observing appropriate contact time as indicated for disinfecting solutions.   Assessment & Plan:

## 2019-10-23 NOTE — Assessment & Plan Note (Signed)
Gets yearly imaging and due Dec 2021.

## 2019-11-06 ENCOUNTER — Other Ambulatory Visit: Payer: Self-pay

## 2019-11-06 ENCOUNTER — Ambulatory Visit (INDEPENDENT_AMBULATORY_CARE_PROVIDER_SITE_OTHER): Payer: Medicare Other | Admitting: Cardiovascular Disease

## 2019-11-06 ENCOUNTER — Encounter: Payer: Self-pay | Admitting: Cardiovascular Disease

## 2019-11-06 VITALS — BP 144/64 | HR 58 | Ht 69.0 in | Wt 165.8 lb

## 2019-11-06 DIAGNOSIS — I739 Peripheral vascular disease, unspecified: Secondary | ICD-10-CM | POA: Diagnosis not present

## 2019-11-06 DIAGNOSIS — E7800 Pure hypercholesterolemia, unspecified: Secondary | ICD-10-CM | POA: Diagnosis not present

## 2019-11-06 DIAGNOSIS — I2510 Atherosclerotic heart disease of native coronary artery without angina pectoris: Secondary | ICD-10-CM | POA: Diagnosis not present

## 2019-11-06 DIAGNOSIS — N1831 Chronic kidney disease, stage 3a: Secondary | ICD-10-CM

## 2019-11-06 DIAGNOSIS — I10 Essential (primary) hypertension: Secondary | ICD-10-CM

## 2019-11-06 DIAGNOSIS — I6523 Occlusion and stenosis of bilateral carotid arteries: Secondary | ICD-10-CM

## 2019-11-06 DIAGNOSIS — I2583 Coronary atherosclerosis due to lipid rich plaque: Secondary | ICD-10-CM | POA: Diagnosis not present

## 2019-11-06 NOTE — Assessment & Plan Note (Signed)
History of left calf claudication without mildly decreased left ABI.  We will obtain lower extremity arterial Dopplers later this year.

## 2019-11-06 NOTE — Patient Instructions (Addendum)
Medication Instructions:  Your physician recommends that you continue on your current medications as directed. Please refer to the Current Medication list given to you today.  *If you need a refill on your cardiac medications before your next appointment, please call your pharmacy*   Testing:  IN December (same day as Carotid doppler--05/26/20): Your physician has requested that you have a lower extremity arterial duplex. During this test, ultrasound is used to evaluate arterial blood flow in the legs. Allow one hour for this exam. There are no restrictions or special instructions.  Your physician has requested that you have an ankle brachial index (ABI). During this test an ultrasound and blood pressure cuff are used to evaluate the arteries that supply the arms and legs with blood. Allow thirty minutes for this exam. There are no restrictions or special instructions.    Follow-Up: At [facility], you and your health needs are our priority.  As part of our continuing mission to provide you with exceptional heart care, we have created designated Provider Care Teams.  These Care Teams include your primary Cardiologist (physician) and Advanced Practice Providers (APPs -  Physician Assistants and Nurse Practitioners) who all work together to provide you with the care you need, when you need it.  We recommend signing up for the patient portal called "MyChart".  Sign up information is provided on this After Visit Summary.  MyChart is used to connect with patients for Virtual Visits (Telemedicine).  Patients are able to view lab/test results, encounter notes, upcoming appointments, etc.  Non-urgent messages can be sent to your provider as well.   To learn more about what you can do with MyChart, go to [email or web address].    Your next appointment:   12 month(s)  The format for your next appointment:   In Person  Provider:   You may see Quay Burow, MD or one of the following Advanced  Practice Providers on your designated Care Team:    Kerin Ransom, PA-C  [place], [place]  Coletta Memos, [place]    Other Instructions Please call our office 2 months in advance to schedule your follow-up appointment with Dr. Gwenlyn Found.

## 2019-11-06 NOTE — Assessment & Plan Note (Signed)
History of moderate left ICA stenosis by duplex ultrasound 06/13/2019.  This will be repeated this December.

## 2019-11-06 NOTE — Assessment & Plan Note (Signed)
History of essential hypertension with blood pressure measured today 144/64.  He is on metoprolol.

## 2019-11-06 NOTE — Assessment & Plan Note (Signed)
History of hyperlipidemia on simvastatin and Zetia with lipid profile performed 09/21/2019 revealing a total cholesterol 137, LDL 71 HDL 42.

## 2019-11-06 NOTE — Progress Notes (Signed)
11/06/2019 [name]   04/21/1941  [id number]  Primary Physician [name], MD Primary Cardiologist: [name] MD FACP, [place], [place], [place]  HPI:  [name] is a 79 y.o.  thin and fit-appearing married Caucasian male with no children, who I las saw in the office 05/07/2019.[name] He has a history of CAD and PVOD. He had coronary artery bypass grafting in 2006 with a LIMA to his LAD, a vein to the RCA, circumflex, and diagonal branches. He did develop paroxysmal atrial fibrillation postoperatively. He has PVOD, status post right carotid endarterectomy performed by Dr. [name], as well as a known aortoiliac and left SFA disease, but he denies claudication except after walking seven or eight blocks in his left calf. His other problems include treated hypertension and dyslipidemia. He had a catheterization July 13, 2010, and found to have patent grafts with moderate aortoiliac disease. He does have moderate renal insufficiency followed by Dr. [name]. He has had laparoscopic cholecystectomy that was uncomplicated in the past.. He did have a negative Myoview stress test 8/14.[name] He does exercise on treadmill for 25 minutes a day with minimal claudication  He did have a left renal artery stent placed by Dr. [name] 2/19 at the request of Dr. [name] .He was hospitalized 05/01/2018 for 1 day because of dizziness and was found to have some symptomatic bradycardia. Beta-blocker was discontinued and subsequent event monitor showed no arrhythmias.  He had a Myoview stress test performed 07/19/2018 that showed ischemia in the the diagonal branch territory. This led to the outpatient cath performed 07/24/2018 revealing a 99% stenosis in the body of the vein graft to diagonal branch which was stented with 2 overlapping synergy drug-eluting stents. His RCA vein graft is occluded but his RCA was widely patent. His LIMA was patent and the vein to the obtuse marginal  branch had a 70% smooth stenosis which did not appear to be physiologically significant. His symptoms have significantly resolved though he still has some epigastric fullness.  He had noticed  some exertional chest tightness while on the treadmill as well as some fatigue. It should be noted that his symptoms resolved back in February after I stented his diagonal branch vein graft.  I have prescribed Imdur and was planning on seeing back in follow-up however he failed to fill the prescription because of fear of dizziness.  He continues to have exertional chest pressure while walking on the treadmill.  Because of ongoing symptoms I elected to proceed with outpatient diagnostic coronary angiography 05/28/2019 revealing occluded diagonal branch vein graft with otherwise unchanged anatomy.  I elected to treat him medically at that time.  He has rare angina.  He does also complain of rare left calf claudication with a slightly low lower left ABI than the right.   Current Meds  Medication Sig  . acyclovir (ZOVIRAX) 400 MG tablet Take 1 tablet (400 mg total) by mouth 5 (five) times daily.  [name] aspirin EC 81 MG tablet Take 81 mg by mouth daily at 12 noon.   . Choline Fenofibrate (FENOFIBRIC ACID) 45 MG CPDR TAKE ONE CAPSULE BY MOUTH EVERY NIGHT AT BEDTIME  . clopidogrel (PLAVIX) 75 MG tablet TAKE ONE TABLET BY MOUTH DAILY WITH BREAKFAST  . dimenhyDRINATE (DRAMAMINE) 50 MG tablet Take 50 mg by mouth every 8 (eight) hours as needed (vertigo).  . ezetimibe (ZETIA) 10 MG tablet TAKE ONE TABLET BY MOUTH DAILY  . furosemide (LASIX) 20 MG tablet Take 10  mg by mouth daily.  . hydrocortisone (ANUSOL-HC) 2.5 % rectal cream Place 1 application rectally daily as needed for anal itching (anal rash).   . hydroxypropyl methylcellulose / hypromellose (ISOPTO TEARS / GONIOVISC) 2.5 % ophthalmic solution Place 1 drop into both eyes at bedtime.   . isosorbide mononitrate (IMDUR) 60 MG 24 hr tablet Take 1 tablet (60 mg total)  by mouth daily.  . meclizine (ANTIVERT) 25 MG tablet Take 1 tablet (25 mg total) by mouth 3 (three) times daily as needed for dizziness.  . nitroGLYCERIN (NITROSTAT) 0.4 MG SL tablet Place 1 tablet (0.4 mg total) under the tongue every 5 (five) minutes as needed for chest pain.  . pantoprazole (PROTONIX) 40 MG tablet TAKE ONE TABLET BY MOUTH DAILY  . simvastatin (ZOCOR) 80 MG tablet TAKE ONE TABLET BY MOUTH DAILY AT 6 PM     Allergies  Allergen Reactions  . Adhesive [Tape] Rash  . Latex Rash    Social History   Socioeconomic History  . Marital status: Married    Spouse name: [name]  . Number of children: 0  . Years of education: Not on file  . Highest education level: Not on file  Occupational History  . Occupation: Retired    Comment: Theme park manager: RETIRED  Tobacco Use  . Smoking status: Never Smoker  . Smokeless tobacco: Never Used  Substance and Sexual Activity  . Alcohol use: Yes    Alcohol/week: 0.0 standard drinks    Comment: rarely  . Drug use: No  . Sexual activity: Yes  Other Topics Concern  . Not on file  Social History Narrative   Last name is pronounced "cas ka" No caffeine use. Always uses seat belts. Exercise:Moderate walks 30 min daily and rides a bike. Uses treadmill 20 mins everyday. Smoke alarm in home, No guns in the home.      Marital status:  Married x 12 years: happily married. Most of family in [place].      Children; none      Lives: with wife      Employment: retired.      Tobacco; never       Alcohol: wine after lunch daily.      Exercise:  Treadmill 20 minutes per day seven days per week.  Also walking throughout the day.      Advanced Directives: YES: FULL CODE no prolonged measures.      ADLs:  Independent with ADLs; no assistant devices.  Drives.    Social Determinants of Health   Financial Resource Strain:   . Difficulty of Paying Living Expenses:   Food Insecurity:   . Worried About Charity fundraiser in the Last Year:     . Arboriculturist in the Last Year:   Transportation Needs:   . Film/video editor (Medical):   [name] Lack of Transportation (Non-Medical):   Physical Activity:   . Days of Exercise per Week:   . Minutes of Exercise per Session:   Stress:   . Feeling of Stress :   Social Connections:   . Frequency of Communication with Friends and Family:   . Frequency of Social Gatherings with Friends and Family:   . Attends Religious Services:   . Active Member of Clubs or Organizations:   . Attends Archivist Meetings:   [name] Marital Status:   Intimate Partner Violence:   . Fear of Current or Ex-Partner:   . Emotionally Abused:   .  Physically Abused:   . Sexually Abused:      Review of Systems: General: negative for chills, fever, night sweats or weight changes.  Cardiovascular: negative for chest pain, dyspnea on exertion, edema, orthopnea, palpitations, paroxysmal nocturnal dyspnea or shortness of breath Dermatological: negative for rash Respiratory: negative for cough or wheezing Urologic: negative for hematuria Abdominal: negative for nausea, vomiting, diarrhea, bright red blood per rectum, melena, or hematemesis Neurologic: negative for visual changes, syncope, or dizziness All other systems reviewed and are otherwise negative except as noted above.    Blood pressure (!) 144/64, pulse (!) 58, height 5\' 9"  (1.753 m), weight 165 lb 12.8 oz (75.2 kg), SpO2 100 %.  General appearance: alert and no distress Neck: no adenopathy, no carotid bruit, no JVD, supple, symmetrical, trachea midline and thyroid not enlarged, symmetric, no tenderness/mass/nodules Lungs: clear to auscultation bilaterally Heart: regular rate and rhythm, S1, S2 normal, no murmur, click, rub or gallop Extremities: extremities normal, atraumatic, no cyanosis or edema Pulses: 2+ and symmetric Skin: Skin color, texture, turgor normal. No rashes or lesions Neurologic: Alert and oriented X 3, normal strength and  tone. Normal symmetric reflexes. Normal coordination and gait  EKG sinus bradycardia 58 with incomplete right bundle branch block and left axis deviation.  I personally reviewed this EKG.   ASSESSMENT AND PLAN:   Essential hypertension, benign History of essential hypertension with blood pressure measured today 144/64.  He is on metoprolol.  Pure hypercholesterolemia History of hyperlipidemia on simvastatin and Zetia with lipid profile performed 09/21/2019 revealing a total cholesterol 137, LDL 71 HDL 42.  Coronary artery disease due to lipid rich plaque History of CAD status post coronary artery bypass grafting in 2006 with a LIMA to his LAD, vein to the RCA, circumflex and diagonal branch.  He did have an abnormal Myoview 07/19/2018 that showed ischemia in the diagonal branch territory which led to an outpatient cath revealing a 99% stenosis in the body of the vein graft which was stented with 2 overlapping drug-eluting stents.  His RCA graft was occluded as well but his RCA was widely patent.  His LIMA was patent as well and the circumflex vein graft had a 70% smooth stenosis which did not appear to be physiologically significant.  Because of similar chest pain I recath him as an outpatient 05/28/2019 revealing an occluded diagonal branch vein graft otherwise unchanged anatomy.  It was elected to continue to treat him medically.  He gets rare chest pain.  Peripheral arterial disease History of left calf claudication without mildly decreased left ABI.  We will obtain lower extremity arterial Dopplers later this year.  Carotid artery disease ([place]) History of moderate left ICA stenosis by duplex ultrasound 06/13/2019.  This will be repeated this December.      [name] MD FACP,FACC,FAHA, [facility] 11/06/2019 12:20 PM

## 2019-11-06 NOTE — Assessment & Plan Note (Signed)
History of CAD status post coronary artery bypass grafting in 2006 with a LIMA to his LAD, vein to the RCA, circumflex and diagonal branch.  He did have an abnormal Myoview 07/19/2018 that showed ischemia in the diagonal branch territory which led to an outpatient cath revealing a 99% stenosis in the body of the vein graft which was stented with 2 overlapping drug-eluting stents.  His RCA graft was occluded as well but his RCA was widely patent.  His LIMA was patent as well and the circumflex vein graft had a 70% smooth stenosis which did not appear to be physiologically significant.  Because of similar chest pain I recath him as an outpatient 05/28/2019 revealing an occluded diagonal branch vein graft otherwise unchanged anatomy.  It was elected to continue to treat him medically.  He gets rare chest pain.

## 2019-11-08 DIAGNOSIS — D485 Neoplasm of uncertain behavior of skin: Secondary | ICD-10-CM | POA: Diagnosis not present

## 2019-11-08 DIAGNOSIS — Z85828 Personal history of other malignant neoplasm of skin: Secondary | ICD-10-CM | POA: Diagnosis not present

## 2019-11-08 DIAGNOSIS — L820 Inflamed seborrheic keratosis: Secondary | ICD-10-CM | POA: Diagnosis not present

## 2019-11-08 DIAGNOSIS — C4A61 Merkel cell carcinoma of right upper limb, including shoulder: Secondary | ICD-10-CM | POA: Diagnosis not present

## 2019-11-20 DIAGNOSIS — D485 Neoplasm of uncertain behavior of skin: Secondary | ICD-10-CM | POA: Diagnosis not present

## 2019-11-21 DIAGNOSIS — C4A61 Merkel cell carcinoma of right upper limb, including shoulder: Secondary | ICD-10-CM | POA: Diagnosis not present

## 2019-11-27 ENCOUNTER — Telehealth: Payer: Self-pay | Admitting: Cardiovascular Disease

## 2019-11-27 NOTE — Telephone Encounter (Signed)
New Message      [facility] Pre-operative Risk Assessment    HEARTCARE STAFF: - Please ensure there is not already an duplicate clearance open for this procedure. - Under Visit Info/Reason for Call, type in Other and utilize the format Clearance MM/DD/YY or Clearance TBD. Do not use dashes or single digits. - If request is for dental extraction, please clarify the # of teeth to be extracted.  Request for surgical clearance:  1. What type of surgery is being performed? Wide local excision and sentinel lymph node biopsy   2. When is this surgery scheduled? 12/04/19   3. What type of clearance is required (medical clearance vs. Pharmacy clearance to hold med vs. Both)? Both   4. Are there any medications that need to be held prior to surgery and how long? Based on Cardiologist preference   5. Practice name and name of physician performing surgery? [facility]  Dr Andi Devon  6. What is the office phone number? [phone]   7.   What is the office fax number? [phone]  8.   Anesthesia type (None, local, MAC, general) ? General    [name] 11/27/2019, 9:24 AM  _________________________________________________________________   (provider comments below)

## 2019-11-27 NOTE — Telephone Encounter (Signed)
   Primary Cardiologist: Quay Burow, MD  Chart reviewed as part of pre-operative protocol coverage. Given past medical history and time since last visit, based on ACC/AHA guidelines, [name] would be at acceptable risk for the planned procedure without further cardiovascular testing.   He may hold his Plavix for 5 days prior to his procedure.  Please resume as soon as hemostasis is achieved.  I will route this recommendation to the requesting party via Epic fax function and remove from pre-op pool.  Please call with questions.  Jossie Ng. Joanne Brander NP-C    11/27/2019, 12:49 PM [facility] [address] Office [phone] Fax [phone]

## 2019-11-27 NOTE — Telephone Encounter (Signed)
OK to hold anti platelet meds for Bx

## 2019-11-27 NOTE — Telephone Encounter (Signed)
New Message:   [name] called again. She said the surgeon would like for pt to have Echo on Friday when he have his appt with Dr Gwenlyn Found please.

## 2019-11-30 ENCOUNTER — Encounter: Payer: Self-pay | Admitting: Cardiovascular Disease

## 2019-11-30 ENCOUNTER — Other Ambulatory Visit: Payer: Self-pay

## 2019-11-30 ENCOUNTER — Ambulatory Visit (INDEPENDENT_AMBULATORY_CARE_PROVIDER_SITE_OTHER): Payer: Medicare Other | Admitting: Cardiovascular Disease

## 2019-11-30 VITALS — BP 132/82 | HR 61 | Ht 69.0 in | Wt 166.8 lb

## 2019-11-30 DIAGNOSIS — Z01818 Encounter for other preprocedural examination: Secondary | ICD-10-CM

## 2019-11-30 DIAGNOSIS — I6523 Occlusion and stenosis of bilateral carotid arteries: Secondary | ICD-10-CM | POA: Diagnosis not present

## 2019-11-30 NOTE — Progress Notes (Signed)
[name] comes in today for preoperative clearance before skin surgery and lymph node dissection because of recently diagnosed skin cancer.  He is on aspirin Plavix.  Last cath him 05/28/2019 revealing an occluded diagonal branch vein graft.  Is otherwise stable.  He can hold his antiplatelet drugs for up to a week before his procedure and restart them afterwards.  I will see him back in 6 months.  He is cleared for his upcoming procedure at low cardiovascular risk.  [name], M.D., [place], [facility], [place] [address] [place], [place]  [address]  [phone] 11/30/2019 11:48 AM

## 2019-11-30 NOTE — Patient Instructions (Signed)
Medication Instructions:  °Your Physician recommend you continue on your current medication as directed.   ° °*If you need a refill on your cardiac medications before your next appointment, please call your pharmacy* ° ° °Lab Work: °None ° ° °Testing/Procedures: °None ° ° °Follow-Up: °At [facility], you and your health needs are our priority.  As part of our continuing mission to provide you with exceptional heart care, we have created designated Provider Care Teams.  These Care Teams include your primary Cardiologist (physician) and Advanced Practice Providers (APPs -  Physician Assistants and Nurse Practitioners) who all work together to provide you with the care you need, when you need it. ° °We recommend signing up for the patient portal called "MyChart".  Sign up information is provided on this After Visit Summary.  MyChart is used to connect with patients for Virtual Visits (Telemedicine).  Patients are able to view lab/test results, encounter notes, upcoming appointments, etc.  Non-urgent messages can be sent to your provider as well.   °To learn more about what you can do with MyChart, go to [email or web address].com.   ° °Your next appointment:   °6 month(s) ° °The format for your next appointment:   °In Person ° °Provider:   °Jonathan Berry, MD ° ° ° °

## 2019-12-06 ENCOUNTER — Other Ambulatory Visit: Payer: Self-pay | Admitting: Cardiovascular Disease

## 2019-12-11 DIAGNOSIS — N189 Chronic kidney disease, unspecified: Secondary | ICD-10-CM | POA: Diagnosis not present

## 2019-12-11 DIAGNOSIS — E785 Hyperlipidemia, unspecified: Secondary | ICD-10-CM | POA: Diagnosis not present

## 2019-12-11 DIAGNOSIS — Z951 Presence of aortocoronary bypass graft: Secondary | ICD-10-CM | POA: Diagnosis not present

## 2019-12-11 DIAGNOSIS — C4A61 Merkel cell carcinoma of right upper limb, including shoulder: Secondary | ICD-10-CM | POA: Diagnosis not present

## 2019-12-11 DIAGNOSIS — I129 Hypertensive chronic kidney disease with stage 1 through stage 4 chronic kidney disease, or unspecified chronic kidney disease: Secondary | ICD-10-CM | POA: Diagnosis not present

## 2019-12-11 DIAGNOSIS — Z9049 Acquired absence of other specified parts of digestive tract: Secondary | ICD-10-CM | POA: Diagnosis not present

## 2019-12-11 DIAGNOSIS — Z955 Presence of coronary angioplasty implant and graft: Secondary | ICD-10-CM | POA: Diagnosis not present

## 2019-12-11 DIAGNOSIS — I2510 Atherosclerotic heart disease of native coronary artery without angina pectoris: Secondary | ICD-10-CM | POA: Diagnosis not present

## 2019-12-19 DIAGNOSIS — Z09 Encounter for follow-up examination after completed treatment for conditions other than malignant neoplasm: Secondary | ICD-10-CM | POA: Diagnosis not present

## 2019-12-19 DIAGNOSIS — C4A61 Merkel cell carcinoma of right upper limb, including shoulder: Secondary | ICD-10-CM | POA: Diagnosis not present

## 2020-01-02 DIAGNOSIS — Z09 Encounter for follow-up examination after completed treatment for conditions other than malignant neoplasm: Secondary | ICD-10-CM | POA: Diagnosis not present

## 2020-01-02 DIAGNOSIS — N059 Unspecified nephritic syndrome with unspecified morphologic changes: Secondary | ICD-10-CM | POA: Diagnosis not present

## 2020-01-02 DIAGNOSIS — I2510 Atherosclerotic heart disease of native coronary artery without angina pectoris: Secondary | ICD-10-CM | POA: Diagnosis not present

## 2020-01-02 DIAGNOSIS — I6523 Occlusion and stenosis of bilateral carotid arteries: Secondary | ICD-10-CM | POA: Diagnosis not present

## 2020-01-02 DIAGNOSIS — Z951 Presence of aortocoronary bypass graft: Secondary | ICD-10-CM | POA: Diagnosis not present

## 2020-01-02 DIAGNOSIS — E785 Hyperlipidemia, unspecified: Secondary | ICD-10-CM | POA: Diagnosis not present

## 2020-01-02 DIAGNOSIS — Z800 Family history of malignant neoplasm of digestive organs: Secondary | ICD-10-CM | POA: Diagnosis not present

## 2020-01-02 DIAGNOSIS — I129 Hypertensive chronic kidney disease with stage 1 through stage 4 chronic kidney disease, or unspecified chronic kidney disease: Secondary | ICD-10-CM | POA: Diagnosis not present

## 2020-01-02 DIAGNOSIS — N189 Chronic kidney disease, unspecified: Secondary | ICD-10-CM | POA: Diagnosis not present

## 2020-01-02 DIAGNOSIS — Z9889 Other specified postprocedural states: Secondary | ICD-10-CM | POA: Diagnosis not present

## 2020-01-02 DIAGNOSIS — C4A61 Merkel cell carcinoma of right upper limb, including shoulder: Secondary | ICD-10-CM | POA: Diagnosis not present

## 2020-01-07 DIAGNOSIS — D0461 Carcinoma in situ of skin of right upper limb, including shoulder: Secondary | ICD-10-CM | POA: Diagnosis not present

## 2020-01-07 DIAGNOSIS — Z85828 Personal history of other malignant neoplasm of skin: Secondary | ICD-10-CM | POA: Diagnosis not present

## 2020-01-07 DIAGNOSIS — D485 Neoplasm of uncertain behavior of skin: Secondary | ICD-10-CM | POA: Diagnosis not present

## 2020-01-07 DIAGNOSIS — C4442 Squamous cell carcinoma of skin of scalp and neck: Secondary | ICD-10-CM | POA: Diagnosis not present

## 2020-01-07 DIAGNOSIS — L820 Inflamed seborrheic keratosis: Secondary | ICD-10-CM | POA: Diagnosis not present

## 2020-02-14 DIAGNOSIS — D045 Carcinoma in situ of skin of trunk: Secondary | ICD-10-CM | POA: Diagnosis not present

## 2020-02-14 DIAGNOSIS — L814 Other melanin hyperpigmentation: Secondary | ICD-10-CM | POA: Diagnosis not present

## 2020-02-14 DIAGNOSIS — L821 Other seborrheic keratosis: Secondary | ICD-10-CM | POA: Diagnosis not present

## 2020-02-14 DIAGNOSIS — Z85828 Personal history of other malignant neoplasm of skin: Secondary | ICD-10-CM | POA: Diagnosis not present

## 2020-02-14 DIAGNOSIS — D2272 Melanocytic nevi of left lower limb, including hip: Secondary | ICD-10-CM | POA: Diagnosis not present

## 2020-02-14 DIAGNOSIS — L820 Inflamed seborrheic keratosis: Secondary | ICD-10-CM | POA: Diagnosis not present

## 2020-02-14 DIAGNOSIS — D2262 Melanocytic nevi of left upper limb, including shoulder: Secondary | ICD-10-CM | POA: Diagnosis not present

## 2020-02-14 DIAGNOSIS — D2271 Melanocytic nevi of right lower limb, including hip: Secondary | ICD-10-CM | POA: Diagnosis not present

## 2020-02-14 DIAGNOSIS — D2261 Melanocytic nevi of right upper limb, including shoulder: Secondary | ICD-10-CM | POA: Diagnosis not present

## 2020-02-14 DIAGNOSIS — L570 Actinic keratosis: Secondary | ICD-10-CM | POA: Diagnosis not present

## 2020-02-14 DIAGNOSIS — D225 Melanocytic nevi of trunk: Secondary | ICD-10-CM | POA: Diagnosis not present

## 2020-02-21 DIAGNOSIS — H2513 Age-related nuclear cataract, bilateral: Secondary | ICD-10-CM | POA: Diagnosis not present

## 2020-02-29 ENCOUNTER — Other Ambulatory Visit: Payer: Self-pay | Admitting: General Practice

## 2020-03-01 ENCOUNTER — Other Ambulatory Visit: Payer: Self-pay | Admitting: General Practice

## 2020-03-25 DIAGNOSIS — N189 Chronic kidney disease, unspecified: Secondary | ICD-10-CM | POA: Diagnosis not present

## 2020-03-25 DIAGNOSIS — E785 Hyperlipidemia, unspecified: Secondary | ICD-10-CM | POA: Diagnosis not present

## 2020-03-25 DIAGNOSIS — N183 Chronic kidney disease, stage 3 unspecified: Secondary | ICD-10-CM | POA: Diagnosis not present

## 2020-04-03 ENCOUNTER — Other Ambulatory Visit: Payer: Self-pay | Admitting: Cardiovascular Disease

## 2020-04-03 ENCOUNTER — Other Ambulatory Visit: Payer: Self-pay | Admitting: *Deleted

## 2020-04-03 ENCOUNTER — Other Ambulatory Visit: Payer: Self-pay | Admitting: Adult Health

## 2020-04-03 DIAGNOSIS — I739 Peripheral vascular disease, unspecified: Secondary | ICD-10-CM

## 2020-04-03 DIAGNOSIS — I10 Essential (primary) hypertension: Secondary | ICD-10-CM

## 2020-04-03 MED ORDER — EZETIMIBE 10 MG PO TABS: 10.0000 mg | ORAL_TABLET | Freq: Every day | ORAL | 1 refills | Status: DC

## 2020-04-07 DIAGNOSIS — H6123 Impacted cerumen, bilateral: Secondary | ICD-10-CM | POA: Diagnosis not present

## 2020-04-27 ENCOUNTER — Other Ambulatory Visit: Payer: Self-pay | Admitting: Cardiovascular Disease

## 2020-05-01 DIAGNOSIS — D631 Anemia in chronic kidney disease: Secondary | ICD-10-CM | POA: Diagnosis not present

## 2020-05-01 DIAGNOSIS — N057 Unspecified nephritic syndrome with diffuse crescentic glomerulonephritis: Secondary | ICD-10-CM | POA: Diagnosis not present

## 2020-05-01 DIAGNOSIS — N058 Unspecified nephritic syndrome with other morphologic changes: Secondary | ICD-10-CM | POA: Diagnosis not present

## 2020-05-01 DIAGNOSIS — N1831 Chronic kidney disease, stage 3a: Secondary | ICD-10-CM | POA: Diagnosis not present

## 2020-05-01 DIAGNOSIS — I129 Hypertensive chronic kidney disease with stage 1 through stage 4 chronic kidney disease, or unspecified chronic kidney disease: Secondary | ICD-10-CM | POA: Diagnosis not present

## 2020-05-01 DIAGNOSIS — I701 Atherosclerosis of renal artery: Secondary | ICD-10-CM | POA: Diagnosis not present

## 2020-05-15 DIAGNOSIS — C44309 Unspecified malignant neoplasm of skin of other parts of face: Secondary | ICD-10-CM | POA: Diagnosis not present

## 2020-05-15 DIAGNOSIS — Z85828 Personal history of other malignant neoplasm of skin: Secondary | ICD-10-CM | POA: Diagnosis not present

## 2020-05-15 DIAGNOSIS — D485 Neoplasm of uncertain behavior of skin: Secondary | ICD-10-CM | POA: Diagnosis not present

## 2020-05-26 ENCOUNTER — Encounter (HOSPITAL_COMMUNITY): Payer: Medicare Other

## 2020-05-26 ENCOUNTER — Other Ambulatory Visit: Payer: Self-pay

## 2020-05-26 ENCOUNTER — Other Ambulatory Visit (HOSPITAL_COMMUNITY): Payer: Self-pay | Admitting: Cardiovascular Disease

## 2020-05-26 ENCOUNTER — Ambulatory Visit (HOSPITAL_BASED_OUTPATIENT_CLINIC_OR_DEPARTMENT_OTHER)
Admission: RE | Admit: 2020-05-26 | Discharge: 2020-05-26 | Disposition: A | Discharge status: Home / Self Care | Payer: Medicare Other | Source: Ambulatory Visit | Attending: Cardiovascular Disease | Admitting: Cardiovascular Disease

## 2020-05-26 ENCOUNTER — Ambulatory Visit (HOSPITAL_COMMUNITY)
Admission: RE | Admit: 2020-05-26 | Discharge: 2020-05-26 | Disposition: A | Discharge status: Home / Self Care | Payer: Medicare Other | Source: Ambulatory Visit | Attending: Cardiology | Admitting: Cardiology

## 2020-05-26 DIAGNOSIS — M79604 Pain in right leg: Secondary | ICD-10-CM | POA: Diagnosis not present

## 2020-05-26 DIAGNOSIS — I6522 Occlusion and stenosis of left carotid artery: Secondary | ICD-10-CM | POA: Diagnosis not present

## 2020-05-26 DIAGNOSIS — I739 Peripheral vascular disease, unspecified: Secondary | ICD-10-CM | POA: Insufficient documentation

## 2020-05-26 DIAGNOSIS — I6529 Occlusion and stenosis of unspecified carotid artery: Secondary | ICD-10-CM | POA: Insufficient documentation

## 2020-05-26 DIAGNOSIS — M79605 Pain in left leg: Secondary | ICD-10-CM | POA: Diagnosis not present

## 2020-05-26 DIAGNOSIS — I6523 Occlusion and stenosis of bilateral carotid arteries: Secondary | ICD-10-CM

## 2020-05-29 ENCOUNTER — Telehealth: Payer: Self-pay | Admitting: Cardiovascular Disease

## 2020-05-29 NOTE — Telephone Encounter (Signed)
Patient stated he had 3 missed calls from [facility], was in chart looking for who called. No notes for any one calling him. May have been reminder call

## 2020-06-03 ENCOUNTER — Ambulatory Visit: Payer: Medicare Other | Admitting: Cardiology

## 2020-06-25 ENCOUNTER — Other Ambulatory Visit: Payer: Self-pay

## 2020-06-25 ENCOUNTER — Encounter: Payer: Self-pay | Admitting: Cardiovascular Disease

## 2020-06-25 ENCOUNTER — Ambulatory Visit (INDEPENDENT_AMBULATORY_CARE_PROVIDER_SITE_OTHER): Payer: Medicare Other | Admitting: Cardiovascular Disease

## 2020-06-25 VITALS — BP 140/70 | HR 58 | Ht 69.0 in | Wt 174.6 lb

## 2020-06-25 DIAGNOSIS — I10 Essential (primary) hypertension: Secondary | ICD-10-CM

## 2020-06-25 DIAGNOSIS — Z01812 Encounter for preprocedural laboratory examination: Secondary | ICD-10-CM | POA: Diagnosis not present

## 2020-06-25 DIAGNOSIS — I2510 Atherosclerotic heart disease of native coronary artery without angina pectoris: Secondary | ICD-10-CM

## 2020-06-25 DIAGNOSIS — E7800 Pure hypercholesterolemia, unspecified: Secondary | ICD-10-CM

## 2020-06-25 DIAGNOSIS — I2583 Coronary atherosclerosis due to lipid rich plaque: Secondary | ICD-10-CM | POA: Diagnosis not present

## 2020-06-25 DIAGNOSIS — I25118 Atherosclerotic heart disease of native coronary artery with other forms of angina pectoris: Secondary | ICD-10-CM | POA: Diagnosis not present

## 2020-06-25 DIAGNOSIS — I6522 Occlusion and stenosis of left carotid artery: Secondary | ICD-10-CM | POA: Diagnosis not present

## 2020-06-25 DIAGNOSIS — I739 Peripheral vascular disease, unspecified: Secondary | ICD-10-CM

## 2020-06-25 LAB — BASIC METABOLIC PANEL
BUN/Creatinine Ratio: 12 (ref 10–24)
BUN: 18 mg/dL (ref 8–27)
CO2: 24 mmol/L (ref 20–29)
Calcium: 9.4 mg/dL (ref 8.6–10.2)
Chloride: 99 mmol/L (ref 96–106)
Creatinine, Ser: 1.55 mg/dL — ABNORMAL HIGH (ref 0.76–1.27)
GFR calc Af Amer: 49 mL/min/{1.73_m2} — ABNORMAL LOW (ref >59)
GFR calc non Af Amer: 42 mL/min/{1.73_m2} — ABNORMAL LOW (ref >59)
Glucose: 83 mg/dL (ref 65–99)
Potassium: 5.1 mmol/L (ref 3.5–5.2)
Sodium: 137 mmol/L (ref 134–144)

## 2020-06-25 LAB — CBC
Hematocrit: 49.2 % (ref 37.5–51.0)
Hemoglobin: 16.7 g/dL (ref 13.0–17.7)
MCH: 29.9 pg (ref 26.6–33.0)
MCHC: 33.9 g/dL (ref 31.5–35.7)
MCV: 88 fL (ref 79–97)
Platelets: 161 10*3/uL (ref 150–450)
RBC: 5.59 x10E6/uL (ref 4.14–5.80)
RDW: 12.6 % (ref 11.6–15.4)
WBC: 5 10*3/uL (ref 3.4–10.8)

## 2020-06-25 NOTE — Assessment & Plan Note (Signed)
History of essential hypertension a blood pressure measured today 140/70.  He is on metoprolol.

## 2020-06-25 NOTE — Addendum Note (Signed)
Addended by: Beatrix Fetters on: 06/25/2020 01:07 PM   Modules accepted: Miquel Dunn

## 2020-06-25 NOTE — Assessment & Plan Note (Signed)
History of PAD with recent lower extremity arterial Doppler studies performed 12/13 7/21 revealing a right ABI of 0.92 and a left of 0.72.  He did have a high-frequency signal in his left common iliac artery and distal left SFA.  He has some mild left calf claudication with moderate exercise.  His most limiting symptoms however are of chest pressure with exertion.  I am going arrange for him to undergo diagnostic coronary angiography next week.  We will also perform an abdominal aortogram to further define his iliac anatomy at that time.

## 2020-06-25 NOTE — Patient Instructions (Addendum)
    [place] [address] Dept: [phone] Loc: [facility]  06/25/2020  You are scheduled for a Cardiac Catheterization on Monday, January 17 with Dr. Quay Burow.  1. Please arrive at the [facility] (Main Entrance A) at [facility]: [address] [place], [place] [address] at 7:30 AM (This time is two hours before your procedure to ensure your preparation). Free valet parking service is available.   Special note: Every effort is made to have your procedure done on time. Please understand that emergencies sometimes delay scheduled procedures.  2. Diet: Do not eat solid foods after midnight.  The patient may have clear liquids until 5am upon the day of the procedure.  3. Labs: You will need to have blood drawn today.  4. Medication instructions in preparation for your procedure:  Stop taking, Lasix (Furosemide)  Sunday, January 16,   On the morning of your procedure, take your Aspirin and Plavix/Clopidogrel and any morning medicines NOT listed above.  You may use sips of water.  5. Plan for one night stay--bring personal belongings. 6. Bring a current list of your medications and current insurance cards. 7. You MUST have a responsible person to drive you home. 8. Someone MUST be with you the first 24 hours after you arrive home or your discharge will be delayed. 9. Please wear clothes that are easy to get on and off and wear slip-on shoes.  Thank you for allowing [place] to care for you!   -- [facility]  You will need a COVID-19  test prior to your procedure. You are scheduled for Friday, 06/27/20 at 9:05 AM. This is a Drive Up Visit at [address] [place], [place] [address]. Someone will direct you to the appropriate testing line. Stay in your car and someone will be with you shortly.  You will need  a 2 week follow up appointment with Dr. Gwenlyn Found.

## 2020-06-25 NOTE — Assessment & Plan Note (Signed)
Moderate left ICA stenosis by recent duplex ultrasound performed 05/26/2020.  This will be repeated on an annual basis.

## 2020-06-25 NOTE — Assessment & Plan Note (Signed)
History of hyperlipidemia on statin therapy with lipid profile performed 03/25/2020 revealed a total cholesterol 143, LDL 71 and HDL 38.

## 2020-06-25 NOTE — H&P (View-Only) (Signed)
06/25/2020 [name]   04/10/1941  [id number]  Primary Physician [name], MD Primary Cardiologist: [name] MD FACP, [place], [place], [place]  HPI:  [name] is a 80 y.o.  thin and fit-appearing married Caucasian male with no children, who I las saw in the office 11/06/2019.[name] He has a history of CAD and PVOD. He had coronary artery bypass grafting in 2006 with a LIMA to his LAD, a vein to the RCA, circumflex, and diagonal branches. He did develop paroxysmal atrial fibrillation postoperatively. He has PVOD, status post right carotid endarterectomy performed by Dr. [name], as well as a known aortoiliac and left SFA disease, but he denies claudication except after walking seven or eight blocks in his left calf. His other problems include treated hypertension and dyslipidemia. He had a catheterization July 13, 2010, and found to have patent grafts with moderate aortoiliac disease. He does have moderate renal insufficiency followed by Dr. [name]. He has had laparoscopic cholecystectomy that was uncomplicated in the past.. He did have a negative Myoview stress test 8/14.[name] He does exercise on treadmill for 25 minutes a day with minimal claudication  He did have a left renal artery stent placed by Dr. [name] 2/19 at the request of Dr. [name] .He was hospitalized 05/01/2018 for 1 day because of dizziness and was found to have some symptomatic bradycardia. Beta-blocker was discontinued and subsequent event monitor showed no arrhythmias.  He had a Myoview stress test performed 07/19/2018 that showed ischemia in the the diagonal branch territory. This led to the outpatient cath performed 07/24/2018 revealing a 99% stenosis in the body of the vein graft to diagonal branch which was stented with 2 overlapping synergy drug-eluting stents. His RCA vein graft is occluded but his RCA was widely patent. His LIMA was patent and the vein to the obtuse marginal  branch had a 70% smooth stenosis which did not appear to be physiologically significant. His symptoms have significantly resolved though he still has some epigastric fullness.  He had noticed  some exertional chest tightness while on the treadmill as well as some fatigue. It should be noted that his symptoms resolved back in February after I stented his diagonal branch vein graft.I have prescribed Imdur and was planning on seeing back in follow-up however he failed to fill the prescription because of fear of dizziness. He continues to have exertional chest pressure while walking on the treadmill.  Because of ongoing symptoms I elected to proceed with outpatient diagnostic coronary angiography 05/28/2019 revealing occluded diagonal branch vein graft with otherwise unchanged anatomy.  I elected to treat him medically at that time.  He has rare angina.  He does also complain of rare left calf claudication with a slightly low lower left ABI than the right.  Since I saw him 6 months ago he has developed some progressive effort angina.  I am concerned this may represent progression of his circumflex obtuse marginal branch vein graft disease.  He also has some left calf claudication with progression of disease in his left common iliac artery as well as left SFA.  He does have moderate renal insufficiency.  I am going to perform diagnostic coronary angiography on him next week with prehydration.    Current Meds  Medication Sig  . acyclovir (ZOVIRAX) 400 MG tablet Take 1 tablet (400 mg total) by mouth 5 (five) times daily.  [name] aspirin EC 81 MG tablet Take 81 mg by mouth daily at 12 noon.   [name]  Choline Fenofibrate (FENOFIBRIC ACID) 45 MG CPDR TAKE ONE CAPSULE BY MOUTH EVERY NIGHT AT BEDTIME  . clopidogrel (PLAVIX) 75 MG tablet TAKE ONE TABLET BY MOUTH DAILY WITH BREAKFAST  . dimenhyDRINATE (DRAMAMINE) 50 MG tablet Take 50 mg by mouth every 8 (eight) hours as needed (vertigo).  . ezetimibe (ZETIA) 10 MG  tablet Take 1 tablet (10 mg total) by mouth daily.  . furosemide (LASIX) 20 MG tablet Take 10 mg by mouth daily.  . hydroxypropyl methylcellulose / hypromellose (ISOPTO TEARS / GONIOVISC) 2.5 % ophthalmic solution Place 1 drop into both eyes at bedtime.   . isosorbide mononitrate (IMDUR) 60 MG 24 hr tablet TAKE ONE TABLET BY MOUTH DAILY  . meclizine (ANTIVERT) 25 MG tablet Take 1 tablet (25 mg total) by mouth 3 (three) times daily as needed for dizziness.  . metoprolol tartrate (LOPRESSOR) 25 MG tablet TAKE 1/2 TABLET BY MOUTH TWO TIMES A DAY  . nitroGLYCERIN (NITROSTAT) 0.4 MG SL tablet Place 1 tablet (0.4 mg total) under the tongue every 5 (five) minutes as needed for chest pain.  . pantoprazole (PROTONIX) 40 MG tablet TAKE ONE TABLET BY MOUTH DAILY  . simvastatin (ZOCOR) 80 MG tablet TAKE ONE TABLET BY MOUTH DAILY AT 6 PM     Allergies  Allergen Reactions  . Adhesive [Tape] Rash  . Latex Rash  . Other Rash    Social History   Socioeconomic History  . Marital status: Married    Spouse name: [name]  . Number of children: 0  . Years of education: Not on file  . Highest education level: Not on file  Occupational History  . Occupation: Retired    Comment: Theme park manager: RETIRED  Tobacco Use  . Smoking status: Never Smoker  . Smokeless tobacco: Never Used  Vaping Use  . Vaping Use: Never used  Substance and Sexual Activity  . Alcohol use: Yes    Alcohol/week: 0.0 standard drinks    Comment: rarely  . Drug use: No  . Sexual activity: Yes  Other Topics Concern  . Not on file  Social History Narrative   Last name is pronounced "cas ka" No caffeine use. Always uses seat belts. Exercise:Moderate walks 30 min daily and rides a bike. Uses treadmill 20 mins everyday. Smoke alarm in home, No guns in the home.      Marital status:  Married x 12 years: happily married. Most of family in [place].      Children; none      Lives: with wife      Employment: retired.       Tobacco; never       Alcohol: wine after lunch daily.      Exercise:  Treadmill 20 minutes per day seven days per week.  Also walking throughout the day.      Advanced Directives: YES: FULL CODE no prolonged measures.      ADLs:  Independent with ADLs; no assistant devices.  Drives.    Social Determinants of Health   Financial Resource Strain: Not on file  Food Insecurity: Not on file  Transportation Needs: Not on file  Physical Activity: Not on file  Stress: Not on file  Social Connections: Not on file  Intimate Partner Violence: Not on file     Review of Systems: General: negative for chills, fever, night sweats or weight changes.  Cardiovascular: negative for chest pain, dyspnea on exertion, edema, orthopnea, palpitations, paroxysmal nocturnal dyspnea or shortness of breath Dermatological: negative  for rash Respiratory: negative for cough or wheezing Urologic: negative for hematuria Abdominal: negative for nausea, vomiting, diarrhea, bright red blood per rectum, melena, or hematemesis Neurologic: negative for visual changes, syncope, or dizziness All other systems reviewed and are otherwise negative except as noted above.    Blood pressure 140/70, pulse (!) 58, height 5\' 9"  (1.753 m), weight 174 lb 9.6 oz (79.2 kg).  General appearance: alert and no distress Neck: no adenopathy, no carotid bruit, no JVD, supple, symmetrical, trachea midline and thyroid not enlarged, symmetric, no tenderness/mass/nodules Lungs: clear to auscultation bilaterally Heart: regular rate and rhythm, S1, S2 normal, no murmur, click, rub or gallop Extremities: extremities normal, atraumatic, no cyanosis or edema Pulses: 2+ and symmetric Skin: Skin color, texture, turgor normal. No rashes or lesions Neurologic: Alert and oriented X 3, normal strength and tone. Normal symmetric reflexes. Normal coordination and gait  EKG sinus bradycardia 58 with early R wave transition and inferior Q waves.  I  personally reviewed this EKG.  ASSESSMENT AND PLAN:   Essential hypertension, benign History of essential hypertension a blood pressure measured today 140/70.  He is on metoprolol.  Pure hypercholesterolemia History of hyperlipidemia on statin therapy with lipid profile performed 03/25/2020 revealed a total cholesterol 143, LDL 71 and HDL 38.  Coronary artery disease due to lipid rich plaque History of CAD status post CABG in 2006 with a LIMA to his LAD, vein to the RCA, circumflex and diagonal branch.  I performed cardiac catheterization on him 07/24/2018 revealing a 99% stenosis in the body of his vein graft to his diagonal branch which I stented using 2 overlapping drug-eluting stents.  The vein to the RCA was occluded but the RCA itself was widely patent.  The LIMA was patent as well.  The obtuse marginal branch vein graft had a 70% smooth stenosis that did not appear to be hemodynamically significant.  Because of ongoing chest pain I recatheterized him 05/28/2019 revealing an occluded diagonal branch vein graft with otherwise unchanged anatomy which I elected to treat medically.  Over the last 6 months he is noticed increasing effort angina.  Based on this and his prior obtuse marginal branch vein graft disease we decided to proceed with outpatient diagnostic coronary angiography to further define his anatomy and assess for progression.  Peripheral arterial disease History of PAD with recent lower extremity arterial Doppler studies performed 12/13 7/21 revealing a right ABI of 0.92 and a left of 0.72.  He did have a high-frequency signal in his left common iliac artery and distal left SFA.  He has some mild left calf claudication with moderate exercise.  His most limiting symptoms however are of chest pressure with exertion.  I am going arrange for him to undergo diagnostic coronary angiography next week.  We will also perform an abdominal aortogram to further define his iliac anatomy at that  time.  Carotid artery disease (HCC) Moderate left ICA stenosis by recent duplex ultrasound performed 05/26/2020.  This will be repeated on an annual basis.      [name] MD FACP,FACC,FAHA, [facility] 06/25/2020 11:20 AM

## 2020-06-25 NOTE — Assessment & Plan Note (Signed)
History of CAD status post CABG in 2006 with a LIMA to his LAD, vein to the RCA, circumflex and diagonal branch.  I performed cardiac catheterization on him 07/24/2018 revealing a 99% stenosis in the body of his vein graft to his diagonal branch which I stented using 2 overlapping drug-eluting stents.  The vein to the RCA was occluded but the RCA itself was widely patent.  The LIMA was patent as well.  The obtuse marginal branch vein graft had a 70% smooth stenosis that did not appear to be hemodynamically significant.  Because of ongoing chest pain I recatheterized him 05/28/2019 revealing an occluded diagonal branch vein graft with otherwise unchanged anatomy which I elected to treat medically.  Over the last 6 months he is noticed increasing effort angina.  Based on this and his prior obtuse marginal branch vein graft disease we decided to proceed with outpatient diagnostic coronary angiography to further define his anatomy and assess for progression.

## 2020-06-25 NOTE — Progress Notes (Signed)
06/25/2020 JOLIN MILBERT   05-11-1941  [id number]  Primary Physician [name], MD Primary Cardiologist: [name] MD FACP, [place], [place], [place]  HPI:  VIET EWELL is a 80 y.o.  thin and fit-appearing married Caucasian male with no children, who I las saw in the office 11/06/2019.[name] He has a history of CAD and PVOD. He had coronary artery bypass grafting in 2006 with a LIMA to his LAD, a vein to the RCA, circumflex, and diagonal branches. He did develop paroxysmal atrial fibrillation postoperatively. He has PVOD, status post right carotid endarterectomy performed by Dr. [name], as well as a known aortoiliac and left SFA disease, but he denies claudication except after walking seven or eight blocks in his left calf. His other problems include treated hypertension and dyslipidemia. He had a catheterization July 13, 2010, and found to have patent grafts with moderate aortoiliac disease. He does have moderate renal insufficiency followed by Dr. [name]. He has had laparoscopic cholecystectomy that was uncomplicated in the past.. He did have a negative Myoview stress test 8/14.[name] He does exercise on treadmill for 25 minutes a day with minimal claudication  He did have a left renal artery stent placed by Dr. [name] 2/19 at the request of Dr. [name] .He was hospitalized 05/01/2018 for 1 day because of dizziness and was found to have some symptomatic bradycardia. Beta-blocker was discontinued and subsequent event monitor showed no arrhythmias.  He had a Myoview stress test performed 07/19/2018 that showed ischemia in the the diagonal branch territory. This led to the outpatient cath performed 07/24/2018 revealing a 99% stenosis in the body of the vein graft to diagonal branch which was stented with 2 overlapping synergy drug-eluting stents. His RCA vein graft is occluded but his RCA was widely patent. His LIMA was patent and the vein to the obtuse marginal  branch had a 70% smooth stenosis which did not appear to be physiologically significant. His symptoms have significantly resolved though he still has some epigastric fullness.  He had noticed  some exertional chest tightness while on the treadmill as well as some fatigue. It should be noted that his symptoms resolved back in February after I stented his diagonal branch vein graft.I have prescribed Imdur and was planning on seeing back in follow-up however he failed to fill the prescription because of fear of dizziness. He continues to have exertional chest pressure while walking on the treadmill.  Because of ongoing symptoms I elected to proceed with outpatient diagnostic coronary angiography 05/28/2019 revealing occluded diagonal branch vein graft with otherwise unchanged anatomy.  I elected to treat him medically at that time.  He has rare angina.  He does also complain of rare left calf claudication with a slightly low lower left ABI than the right.  Since I saw him 6 months ago he has developed some progressive effort angina.  I am concerned this may represent progression of his circumflex obtuse marginal branch vein graft disease.  He also has some left calf claudication with progression of disease in his left common iliac artery as well as left SFA.  He does have moderate renal insufficiency.  I am going to perform diagnostic coronary angiography on him next week with prehydration.    Current Meds  Medication Sig  . acyclovir (ZOVIRAX) 400 MG tablet Take 1 tablet (400 mg total) by mouth 5 (five) times daily.  [name] aspirin EC 81 MG tablet Take 81 mg by mouth daily at 12 noon.   [name]  Choline Fenofibrate (FENOFIBRIC ACID) 45 MG CPDR TAKE ONE CAPSULE BY MOUTH EVERY NIGHT AT BEDTIME  . clopidogrel (PLAVIX) 75 MG tablet TAKE ONE TABLET BY MOUTH DAILY WITH BREAKFAST  . dimenhyDRINATE (DRAMAMINE) 50 MG tablet Take 50 mg by mouth every 8 (eight) hours as needed (vertigo).  . ezetimibe (ZETIA) 10 MG  tablet Take 1 tablet (10 mg total) by mouth daily.  . furosemide (LASIX) 20 MG tablet Take 10 mg by mouth daily.  . hydroxypropyl methylcellulose / hypromellose (ISOPTO TEARS / GONIOVISC) 2.5 % ophthalmic solution Place 1 drop into both eyes at bedtime.   . isosorbide mononitrate (IMDUR) 60 MG 24 hr tablet TAKE ONE TABLET BY MOUTH DAILY  . meclizine (ANTIVERT) 25 MG tablet Take 1 tablet (25 mg total) by mouth 3 (three) times daily as needed for dizziness.  . metoprolol tartrate (LOPRESSOR) 25 MG tablet TAKE 1/2 TABLET BY MOUTH TWO TIMES A DAY  . nitroGLYCERIN (NITROSTAT) 0.4 MG SL tablet Place 1 tablet (0.4 mg total) under the tongue every 5 (five) minutes as needed for chest pain.  . pantoprazole (PROTONIX) 40 MG tablet TAKE ONE TABLET BY MOUTH DAILY  . simvastatin (ZOCOR) 80 MG tablet TAKE ONE TABLET BY MOUTH DAILY AT 6 PM     Allergies  Allergen Reactions  . Adhesive [Tape] Rash  . Latex Rash  . Other Rash    Social History   Socioeconomic History  . Marital status: Married    Spouse name: [name]  . Number of children: 0  . Years of education: Not on file  . Highest education level: Not on file  Occupational History  . Occupation: Retired    Comment: Theme park manager: RETIRED  Tobacco Use  . Smoking status: Never Smoker  . Smokeless tobacco: Never Used  Vaping Use  . Vaping Use: Never used  Substance and Sexual Activity  . Alcohol use: Yes    Alcohol/week: 0.0 standard drinks    Comment: rarely  . Drug use: No  . Sexual activity: Yes  Other Topics Concern  . Not on file  Social History Narrative   Last name is pronounced "cas ka" No caffeine use. Always uses seat belts. Exercise:Moderate walks 30 min daily and rides a bike. Uses treadmill 20 mins everyday. Smoke alarm in home, No guns in the home.      Marital status:  Married x 12 years: happily married. Most of family in [place].      Children; none      Lives: with wife      Employment: retired.       Tobacco; never       Alcohol: wine after lunch daily.      Exercise:  Treadmill 20 minutes per day seven days per week.  Also walking throughout the day.      Advanced Directives: YES: FULL CODE no prolonged measures.      ADLs:  Independent with ADLs; no assistant devices.  Drives.    Social Determinants of Health   Financial Resource Strain: Not on file  Food Insecurity: Not on file  Transportation Needs: Not on file  Physical Activity: Not on file  Stress: Not on file  Social Connections: Not on file  Intimate Partner Violence: Not on file     Review of Systems: General: negative for chills, fever, night sweats or weight changes.  Cardiovascular: negative for chest pain, dyspnea on exertion, edema, orthopnea, palpitations, paroxysmal nocturnal dyspnea or shortness of breath Dermatological: negative  for rash Respiratory: negative for cough or wheezing Urologic: negative for hematuria Abdominal: negative for nausea, vomiting, diarrhea, bright red blood per rectum, melena, or hematemesis Neurologic: negative for visual changes, syncope, or dizziness All other systems reviewed and are otherwise negative except as noted above.    Blood pressure 140/70, pulse (!) 58, height 5\' 9"  (1.753 m), weight 174 lb 9.6 oz (79.2 kg).  General appearance: alert and no distress Neck: no adenopathy, no carotid bruit, no JVD, supple, symmetrical, trachea midline and thyroid not enlarged, symmetric, no tenderness/mass/nodules Lungs: clear to auscultation bilaterally Heart: regular rate and rhythm, S1, S2 normal, no murmur, click, rub or gallop Extremities: extremities normal, atraumatic, no cyanosis or edema Pulses: 2+ and symmetric Skin: Skin color, texture, turgor normal. No rashes or lesions Neurologic: Alert and oriented X 3, normal strength and tone. Normal symmetric reflexes. Normal coordination and gait  EKG sinus bradycardia 58 with early R wave transition and inferior Q waves.  I  personally reviewed this EKG.  ASSESSMENT AND PLAN:   Essential hypertension, benign History of essential hypertension a blood pressure measured today 140/70.  He is on metoprolol.  Pure hypercholesterolemia History of hyperlipidemia on statin therapy with lipid profile performed 03/25/2020 revealed a total cholesterol 143, LDL 71 and HDL 38.  Coronary artery disease due to lipid rich plaque History of CAD status post CABG in 2006 with a LIMA to his LAD, vein to the RCA, circumflex and diagonal branch.  I performed cardiac catheterization on him 07/24/2018 revealing a 99% stenosis in the body of his vein graft to his diagonal branch which I stented using 2 overlapping drug-eluting stents.  The vein to the RCA was occluded but the RCA itself was widely patent.  The LIMA was patent as well.  The obtuse marginal branch vein graft had a 70% smooth stenosis that did not appear to be hemodynamically significant.  Because of ongoing chest pain I recatheterized him 05/28/2019 revealing an occluded diagonal branch vein graft with otherwise unchanged anatomy which I elected to treat medically.  Over the last 6 months he is noticed increasing effort angina.  Based on this and his prior obtuse marginal branch vein graft disease we decided to proceed with outpatient diagnostic coronary angiography to further define his anatomy and assess for progression.  Peripheral arterial disease History of PAD with recent lower extremity arterial Doppler studies performed 12/13 7/21 revealing a right ABI of 0.92 and a left of 0.72.  He did have a high-frequency signal in his left common iliac artery and distal left SFA.  He has some mild left calf claudication with moderate exercise.  His most limiting symptoms however are of chest pressure with exertion.  I am going arrange for him to undergo diagnostic coronary angiography next week.  We will also perform an abdominal aortogram to further define his iliac anatomy at that  time.  Carotid artery disease (HCC) Moderate left ICA stenosis by recent duplex ultrasound performed 05/26/2020.  This will be repeated on an annual basis.      [name] MD FACP,FACC,FAHA, [facility] 06/25/2020 11:20 AM

## 2020-06-26 ENCOUNTER — Telehealth: Payer: Self-pay | Admitting: *Deleted

## 2020-06-26 ENCOUNTER — Other Ambulatory Visit: Payer: Self-pay

## 2020-06-26 DIAGNOSIS — I25708 Atherosclerosis of coronary artery bypass graft(s), unspecified, with other forms of angina pectoris: Secondary | ICD-10-CM

## 2020-06-26 DIAGNOSIS — I25118 Atherosclerotic heart disease of native coronary artery with other forms of angina pectoris: Secondary | ICD-10-CM

## 2020-06-26 MED ORDER — SODIUM CHLORIDE 0.9% FLUSH: 3.0000 mL | Freq: Two times a day (BID) | INTRAVENOUS | Status: DC

## 2020-06-26 NOTE — Telephone Encounter (Signed)
Pt contacted pre-catheterization scheduled at [facility] for: Monday June 30, 2020 10:30 AM Verified arrival time and place: [facility]) at: 7:30 AM-pre-procedure hydration    No solid food after midnight prior to cath, clear liquids until 5 AM day of procedure.  Hold: Lasix-day before and day of procedure  Except hold medications AM meds can be  taken pre-cath with sips of water including: ASA 81 mg Plavix 75 mg  Confirmed patient has responsible adult to drive home post procedure and be with patient first 24 hours after arriving home: yes  You are allowed ONE visitor in the waiting room during the time you are at the hospital for your procedure. Both you and your visitor must wear a mask once you enter the hospital.    Reviewed procedure/mask/visitor instructions with patient.

## 2020-06-27 ENCOUNTER — Other Ambulatory Visit (HOSPITAL_COMMUNITY)
Admission: RE | Admit: 2020-06-27 | Discharge: 2020-06-27 | Disposition: A | Discharge status: Home / Self Care | Payer: Medicare Other | Source: Ambulatory Visit | Attending: Cardiovascular Disease | Admitting: Cardiovascular Disease

## 2020-06-27 DIAGNOSIS — Z01812 Encounter for preprocedural laboratory examination: Secondary | ICD-10-CM | POA: Insufficient documentation

## 2020-06-27 DIAGNOSIS — Z20822 Contact with and (suspected) exposure to covid-19: Secondary | ICD-10-CM | POA: Diagnosis not present

## 2020-06-27 LAB — SARS CORONAVIRUS 2 (TAT 6-24 HRS): SARS Coronavirus 2: NEGATIVE

## 2020-06-29 NOTE — Progress Notes (Signed)
Due to the poor road conditions, Dr. Gwenlyn Found requested to cancel this case and reschedule. Contact made with the patient and the patient is in agreement. Will call the office to reschedule/

## 2020-06-30 ENCOUNTER — Encounter (HOSPITAL_COMMUNITY): Admission: RE | Payer: Self-pay | Source: Home / Self Care

## 2020-06-30 ENCOUNTER — Ambulatory Visit (HOSPITAL_COMMUNITY): Admission: RE | Admit: 2020-06-30 | Payer: Medicare Other | Source: Home / Self Care | Admitting: Cardiovascular Disease

## 2020-06-30 SURGERY — ABDOMINAL AORTOGRAM W/LOWER EXTREMITY
Anesthesia: LOCAL

## 2020-07-03 ENCOUNTER — Telehealth: Payer: Self-pay | Admitting: Cardiovascular Disease

## 2020-07-03 DIAGNOSIS — I25118 Atherosclerotic heart disease of native coronary artery with other forms of angina pectoris: Secondary | ICD-10-CM

## 2020-07-03 DIAGNOSIS — Z01812 Encounter for preprocedural laboratory examination: Secondary | ICD-10-CM

## 2020-07-03 NOTE — Telephone Encounter (Signed)
Spoke to patient he stated abd aortogram was cancelled on 03/30/21 due to bad weather.Stated he was calling to reschedule.Advised Dr.Berry's RN is out of office.I will send message to her to reschedule.

## 2020-07-03 NOTE — Telephone Encounter (Signed)
Patient calling to reschedule his catheterization.

## 2020-07-04 DIAGNOSIS — I25118 Atherosclerotic heart disease of native coronary artery with other forms of angina pectoris: Secondary | ICD-10-CM | POA: Diagnosis not present

## 2020-07-04 DIAGNOSIS — Z01812 Encounter for preprocedural laboratory examination: Secondary | ICD-10-CM | POA: Diagnosis not present

## 2020-07-04 NOTE — Telephone Encounter (Signed)
Patients procedure was recently scheduled for 06/30/20 but due to weather it was cancelled.   Spoke with Dr. Gwenlyn Found who states that patient does need to have Abdominal Aortogram/Left Heart Cath with possible angiography rescheduled to next week if possible due to patients chest pain.   Abdominal Aortogram/Left Heart Cath has been rescheduled for Thursday 07/10/20 at 10:30am with Dr. Gwenlyn Found. Patient will need to arrive at 7:30am to [facility] and will possibly need pre-hydration prior to cath. Staff message has been sent to [employer].   Patients Covid Test has been scheduled for Monday 07/07/20 at 2:25pm.   New order for labs placed today- BMET  & CBC which patient will come and have drawn today.   Patient is aware of all instructions and verbalized understanding.

## 2020-07-05 LAB — BASIC METABOLIC PANEL
BUN/Creatinine Ratio: 14 (ref 10–24)
BUN: 20 mg/dL (ref 8–27)
CO2: 24 mmol/L (ref 20–29)
Calcium: 9.6 mg/dL (ref 8.6–10.2)
Chloride: 98 mmol/L (ref 96–106)
Creatinine, Ser: 1.47 mg/dL — ABNORMAL HIGH (ref 0.76–1.27)
GFR calc Af Amer: 52 mL/min/{1.73_m2} — ABNORMAL LOW (ref >59)
GFR calc non Af Amer: 45 mL/min/{1.73_m2} — ABNORMAL LOW (ref >59)
Glucose: 68 mg/dL (ref 65–99)
Potassium: 5.4 mmol/L — ABNORMAL HIGH (ref 3.5–5.2)
Sodium: 137 mmol/L (ref 134–144)

## 2020-07-05 LAB — CBC
Hematocrit: 50.2 % (ref 37.5–51.0)
Hemoglobin: 16.8 g/dL (ref 13.0–17.7)
MCH: 29.5 pg (ref 26.6–33.0)
MCHC: 33.5 g/dL (ref 31.5–35.7)
MCV: 88 fL (ref 79–97)
Platelets: 173 10*3/uL (ref 150–450)
RBC: 5.7 x10E6/uL (ref 4.14–5.80)
RDW: 12.9 % (ref 11.6–15.4)
WBC: 5.8 10*3/uL (ref 3.4–10.8)

## 2020-07-07 ENCOUNTER — Other Ambulatory Visit (HOSPITAL_COMMUNITY)
Admission: RE | Admit: 2020-07-07 | Discharge: 2020-07-07 | Disposition: A | Discharge status: Home / Self Care | Payer: Medicare Other | Source: Ambulatory Visit | Attending: Cardiovascular Disease | Admitting: Cardiovascular Disease

## 2020-07-07 DIAGNOSIS — Z01812 Encounter for preprocedural laboratory examination: Secondary | ICD-10-CM | POA: Diagnosis not present

## 2020-07-07 DIAGNOSIS — Z20822 Contact with and (suspected) exposure to covid-19: Secondary | ICD-10-CM | POA: Insufficient documentation

## 2020-07-07 DIAGNOSIS — C4A61 Merkel cell carcinoma of right upper limb, including shoulder: Secondary | ICD-10-CM | POA: Diagnosis not present

## 2020-07-07 LAB — SARS CORONAVIRUS 2 (TAT 6-24 HRS): SARS Coronavirus 2: NEGATIVE

## 2020-07-08 ENCOUNTER — Ambulatory Visit: Payer: Medicare Other | Admitting: Cardiovascular Disease

## 2020-07-08 ENCOUNTER — Telehealth: Payer: Self-pay | Admitting: *Deleted

## 2020-07-08 NOTE — Telephone Encounter (Signed)
Pt contacted pre-catheterization scheduled at [facility] for: Thursday July 10, 2020 10:30 AM Verified arrival time and place: [facility]) at: 6:30 AM-pre-procedure hydration-3 hours hydration per Dr Gwenlyn Found.   No solid food after midnight prior to cath, clear liquids until 5 AM day of procedure.  Hold: Lasix-day before and day of procedure-GFR 45  Except hold medications AM meds can be  taken pre-cath with sips of water including: ASA 81 mg Plavix 75 mg   Confirmed patient has responsible adult to drive home post procedure and be with patient first 24 hours after arriving home: yes  You are allowed ONE visitor in the waiting room during the time you are at the hospital for your procedure. Both you and your visitor must wear a mask once you enter the hospital.    Reviewed procedure/mask/visitor instructions with patient.

## 2020-07-10 ENCOUNTER — Encounter (HOSPITAL_COMMUNITY)
Admission: RE | Disposition: A | Discharge status: Home / Self Care | Payer: Self-pay | Source: Home / Self Care | Attending: Cardiovascular Disease

## 2020-07-10 ENCOUNTER — Ambulatory Visit (HOSPITAL_COMMUNITY)
Admission: RE | Admit: 2020-07-10 | Discharge: 2020-07-10 | Disposition: A | Discharge status: Home / Self Care | Payer: Medicare Other | Attending: Cardiovascular Disease | Admitting: Cardiovascular Disease

## 2020-07-10 DIAGNOSIS — Z951 Presence of aortocoronary bypass graft: Secondary | ICD-10-CM | POA: Diagnosis not present

## 2020-07-10 DIAGNOSIS — Z7982 Long term (current) use of aspirin: Secondary | ICD-10-CM | POA: Diagnosis not present

## 2020-07-10 DIAGNOSIS — I2583 Coronary atherosclerosis due to lipid rich plaque: Secondary | ICD-10-CM | POA: Insufficient documentation

## 2020-07-10 DIAGNOSIS — Z79899 Other long term (current) drug therapy: Secondary | ICD-10-CM | POA: Insufficient documentation

## 2020-07-10 DIAGNOSIS — I2510 Atherosclerotic heart disease of native coronary artery without angina pectoris: Secondary | ICD-10-CM | POA: Diagnosis not present

## 2020-07-10 DIAGNOSIS — I739 Peripheral vascular disease, unspecified: Secondary | ICD-10-CM | POA: Diagnosis not present

## 2020-07-10 DIAGNOSIS — Z7902 Long term (current) use of antithrombotics/antiplatelets: Secondary | ICD-10-CM | POA: Diagnosis not present

## 2020-07-10 DIAGNOSIS — I6522 Occlusion and stenosis of left carotid artery: Secondary | ICD-10-CM | POA: Diagnosis not present

## 2020-07-10 DIAGNOSIS — E7800 Pure hypercholesterolemia, unspecified: Secondary | ICD-10-CM | POA: Diagnosis not present

## 2020-07-10 DIAGNOSIS — Z91040 Latex allergy status: Secondary | ICD-10-CM | POA: Diagnosis not present

## 2020-07-10 DIAGNOSIS — N289 Disorder of kidney and ureter, unspecified: Secondary | ICD-10-CM | POA: Insufficient documentation

## 2020-07-10 DIAGNOSIS — I10 Essential (primary) hypertension: Secondary | ICD-10-CM | POA: Insufficient documentation

## 2020-07-10 DIAGNOSIS — I25119 Atherosclerotic heart disease of native coronary artery with unspecified angina pectoris: Secondary | ICD-10-CM | POA: Diagnosis not present

## 2020-07-10 HISTORY — PX: LEFT HEART CATH AND CORS/GRAFTS ANGIOGRAPHY: CATH118250

## 2020-07-10 HISTORY — PX: ABDOMINAL AORTOGRAM W/LOWER EXTREMITY: CATH118223

## 2020-07-10 SURGERY — ABDOMINAL AORTOGRAM W/LOWER EXTREMITY
Anesthesia: LOCAL

## 2020-07-10 MED ORDER — FENTANYL CITRATE (PF) 100 MCG/2ML IJ SOLN
INTRAMUSCULAR | Status: DC | PRN
  Administered 2020-07-10: 25 ug via INTRAVENOUS

## 2020-07-10 MED ORDER — HEPARIN (PORCINE) IN NACL 1000-0.9 UT/500ML-% IV SOLN
INTRAVENOUS | Status: AC
  Filled 2020-07-10: qty 1000

## 2020-07-10 MED ORDER — HYDRALAZINE HCL 20 MG/ML IJ SOLN: 10.0000 mg | INTRAMUSCULAR | Status: DC | PRN

## 2020-07-10 MED ORDER — MIDAZOLAM HCL 2 MG/2ML IJ SOLN
INTRAMUSCULAR | Status: DC | PRN
  Administered 2020-07-10: 1 mg via INTRAVENOUS

## 2020-07-10 MED ORDER — MORPHINE SULFATE (PF) 2 MG/ML IV SOLN
2.0000 mg | INTRAVENOUS | Status: DC | PRN
Start: 2020-07-10 — End: 2020-07-10

## 2020-07-10 MED ORDER — MIDAZOLAM HCL 2 MG/2ML IJ SOLN
INTRAMUSCULAR | Status: AC
  Filled 2020-07-10: qty 2

## 2020-07-10 MED ORDER — LIDOCAINE HCL (PF) 1 % IJ SOLN
INTRAMUSCULAR | Status: DC | PRN
  Administered 2020-07-10: 15 mL

## 2020-07-10 MED ORDER — ASPIRIN 81 MG PO CHEW: 81.0000 mg | CHEWABLE_TABLET | ORAL | Status: DC

## 2020-07-10 MED ORDER — SODIUM CHLORIDE 0.9% FLUSH: 3.0000 mL | Freq: Two times a day (BID) | INTRAVENOUS | Status: DC

## 2020-07-10 MED ORDER — LABETALOL HCL 5 MG/ML IV SOLN
10.0000 mg | INTRAVENOUS | Status: DC | PRN
Start: 2020-07-10 — End: 2020-07-10

## 2020-07-10 MED ORDER — IODIXANOL 320 MG/ML IV SOLN
INTRAVENOUS | Status: DC | PRN
  Administered 2020-07-10: 90 mL

## 2020-07-10 MED ORDER — ONDANSETRON HCL 4 MG/2ML IJ SOLN: 4.0000 mg | Freq: Four times a day (QID) | INTRAMUSCULAR | Status: DC | PRN

## 2020-07-10 MED ORDER — HEPARIN (PORCINE) IN NACL 1000-0.9 UT/500ML-% IV SOLN
INTRAVENOUS | Status: DC | PRN
  Administered 2020-07-10 (×2): 500 mL

## 2020-07-10 MED ORDER — SODIUM CHLORIDE 0.9% FLUSH: 3.0000 mL | INTRAVENOUS | Status: DC | PRN

## 2020-07-10 MED ORDER — LIDOCAINE HCL (PF) 1 % IJ SOLN
INTRAMUSCULAR | Status: AC
  Filled 2020-07-10: qty 30

## 2020-07-10 MED ORDER — SODIUM CHLORIDE 0.9 % IV SOLN: 250.0000 mL | INTRAVENOUS | Status: DC | PRN

## 2020-07-10 MED ORDER — SODIUM CHLORIDE 0.9 % WEIGHT BASED INFUSION: 1.0000 mL/kg/h | INTRAVENOUS | Status: DC

## 2020-07-10 MED ORDER — SODIUM CHLORIDE 0.9 % IV SOLN: INTRAVENOUS | Status: DC

## 2020-07-10 MED ORDER — ACETAMINOPHEN 325 MG PO TABS: 650.0000 mg | ORAL_TABLET | ORAL | Status: DC | PRN

## 2020-07-10 MED ORDER — SODIUM CHLORIDE 0.9 % WEIGHT BASED INFUSION
3.0000 mL/kg/h | INTRAVENOUS | Status: AC
  Administered 2020-07-10: 3 mL/kg/h via INTRAVENOUS

## 2020-07-10 MED ORDER — FENTANYL CITRATE (PF) 100 MCG/2ML IJ SOLN
INTRAMUSCULAR | Status: AC
  Filled 2020-07-10: qty 2

## 2020-07-10 MED ORDER — ASPIRIN 81 MG PO CHEW: 81.0000 mg | CHEWABLE_TABLET | Freq: Every day | ORAL | Status: DC

## 2020-07-10 SURGICAL SUPPLY — 10 items
CATH INFINITI 5 FR LCB (CATHETERS) ×2 IMPLANT
CATH INFINITI 5FR MULTPACK ANG (CATHETERS) ×2 IMPLANT
CLOSURE MYNX CONTROL 5F (Vascular Products) ×2 IMPLANT
KIT PV (KITS) ×2 IMPLANT
SHEATH PINNACLE 5F 10CM (SHEATH) ×2 IMPLANT
SHEATH PROBE COVER 6X72 (BAG) ×2 IMPLANT
SYR MEDRAD MARK 7 150ML (SYRINGE) ×2 IMPLANT
TRANSDUCER W/STOPCOCK (MISCELLANEOUS) ×2 IMPLANT
TRAY PV CATH (CUSTOM PROCEDURE TRAY) ×2 IMPLANT
WIRE HITORQ VERSACORE ST 145CM (WIRE) ×2 IMPLANT

## 2020-07-10 NOTE — Discharge Instructions (Signed)
Femoral Site Care  This sheet gives you information about how to care for yourself after your procedure. Your health care provider may also give you more specific instructions. If you have problems or questions, contact your health care provider. What can I expect after the procedure? After the procedure, it is common to have:  Bruising that usually fades within 1-2 weeks.  Tenderness at the site. Follow these instructions at home: Wound care  Follow instructions from your health care provider about how to take care of your insertion site. Make sure you: ? Wash your hands with soap and water before you change your bandage (dressing). If soap and water are not available, use hand sanitizer. ? Change your dressing as told by your health care provider. ? Leave stitches (sutures), skin glue, or adhesive strips in place. These skin closures may need to stay in place for 2 weeks or longer. If adhesive strip edges start to loosen and curl up, you may trim the loose edges. Do not remove adhesive strips completely unless your health care provider tells you to do that.  Do not take baths, swim, or use a hot tub until your health care provider approves.  You may shower 24-48 hours after the procedure or as told by your health care provider. ? Gently wash the site with plain soap and water. ? Pat the area dry with a clean towel. ? Do not rub the site. This may cause bleeding.  Do not apply powder or lotion to the site. Keep the site clean and dry.  Check your femoral site every day for signs of infection. Check for: ? Redness, swelling, or pain. ? Fluid or blood. ? Warmth. ? Pus or a bad smell. Activity  For the first 2-3 days after your procedure, or as long as directed: ? Avoid climbing stairs as much as possible. ? Do not squat.  Do not lift anything that is heavier than 10 lb (4.5 kg), or the limit that you are told, until your health care provider says that it is safe.  Rest as  directed. ? Avoid sitting for a long time without moving. Get up to take short walks every 1-2 hours.  Do not drive for 24 hours if you were given a medicine to help you relax (sedative). General instructions  Take over-the-counter and prescription medicines only as told by your health care provider.  Keep all follow-up visits as told by your health care provider. This is important. Contact a health care provider if you have:  A fever or chills.  You have redness, swelling, or pain around your insertion site. Get help right away if:  The catheter insertion area swells very fast.  You pass out.  You suddenly start to sweat or your skin gets clammy.  The catheter insertion area is bleeding, and the bleeding does not stop when you hold steady pressure on the area.  The area near or just beyond the catheter insertion site becomes pale, cool, tingly, or numb. These symptoms may represent a serious problem that is an emergency. Do not wait to see if the symptoms will go away. Get medical help right away. Call your local emergency services (911 in the [place] Do not drive yourself to the hospital. Summary  After the procedure, it is common to have bruising that usually fades within 1-2 weeks.  Check your femoral site every day for signs of infection.  Do not lift anything that is heavier than 10 lb (4.5 kg), or   the limit that you are told, until your health care provider says that it is safe. This information is not intended to replace advice given to you by your health care provider. Make sure you discuss any questions you have with your health care provider. Document Revised: 02/01/2020 Document Reviewed: 02/01/2020 Elsevier Patient Education  2021 Elsevier Inc.  

## 2020-07-10 NOTE — Interval H&P Note (Signed)
Cath Lab Visit (complete for each Cath Lab visit)  Clinical Evaluation Leading to the Procedure:   ACS: No.  Non-ACS:    Anginal Classification: CCS II  Anti-ischemic medical therapy: Maximal Therapy (2 or more classes of medications)  Non-Invasive Test Results: No non-invasive testing performed  Prior CABG: Previous CABG      History and Physical Interval Note:  07/10/2020 10:25 AM  [name]  has presented today for surgery, with the diagnosis of Clautication,Angina.  The various methods of treatment have been discussed with the patient and family. After consideration of risks, benefits and other options for treatment, the patient has consented to  Procedure(s): ABDOMINAL AORTOGRAM W/LOWER EXTREMITY (N/A) LEFT HEART CATH AND CORS/GRAFTS ANGIOGRAPHY (N/A) as a surgical intervention.  The patient's history has been reviewed, patient examined, no change in status, stable for surgery.  I have reviewed the patient's chart and labs.  Questions were answered to the patient's satisfaction.     Quay Burow

## 2020-07-11 ENCOUNTER — Encounter (HOSPITAL_COMMUNITY): Payer: Self-pay | Admitting: Cardiovascular Disease

## 2020-07-23 DIAGNOSIS — C44309 Unspecified malignant neoplasm of skin of other parts of face: Secondary | ICD-10-CM | POA: Diagnosis not present

## 2020-07-25 ENCOUNTER — Encounter: Payer: Self-pay | Admitting: Cardiovascular Disease

## 2020-07-25 ENCOUNTER — Other Ambulatory Visit: Payer: Self-pay

## 2020-07-25 ENCOUNTER — Ambulatory Visit (INDEPENDENT_AMBULATORY_CARE_PROVIDER_SITE_OTHER): Payer: Medicare Other | Admitting: Cardiovascular Disease

## 2020-07-25 DIAGNOSIS — I2510 Atherosclerotic heart disease of native coronary artery without angina pectoris: Secondary | ICD-10-CM | POA: Diagnosis not present

## 2020-07-25 DIAGNOSIS — I2583 Coronary atherosclerosis due to lipid rich plaque: Secondary | ICD-10-CM | POA: Diagnosis not present

## 2020-07-25 DIAGNOSIS — I739 Peripheral vascular disease, unspecified: Secondary | ICD-10-CM

## 2020-07-25 DIAGNOSIS — I6522 Occlusion and stenosis of left carotid artery: Secondary | ICD-10-CM | POA: Diagnosis not present

## 2020-07-25 NOTE — Assessment & Plan Note (Signed)
History of carotid artery disease with Dopplers performed 05/26/2020 revealing moderate left ICA stenosis.  This will be repeated on annual basis.

## 2020-07-25 NOTE — Assessment & Plan Note (Signed)
History of claudication with lower extremity arterial Doppler studies suggesting iliac disease although at the time of his recent cardiac cath I did perform abdominal aortography and bilateral iliac angiography is revealed a small distal abdominal aorta with no significant iliac disease.  His lower extremity arterial Doppler studies did however suggest a lesion in his left SFA performed 05/26/2020 with a right ABI of 0.92 and a left of 0.72 although he really denies claudication.  We will continue to follow this noninvasively.

## 2020-07-25 NOTE — Patient Instructions (Signed)
Medication Instructions:  Your physician recommends that you continue on your current medications as directed. Please refer to the Current Medication list given to you today.  *If you need a refill on your cardiac medications before your next appointment, please call your pharmacy*  Testing/Procedures: Your physician has requested that you have a lower extremity arterial duplex in December 2022. This test is an ultrasound of the arteries in the legs. It looks at arterial blood flow in the legs. Allow one hour for Lower Arterial scans. There are no restrictions or special instructions  Your physician has requested that you have a carotid duplex in December 2022. This test is an ultrasound of the carotid arteries in your neck. It looks at blood flow through these arteries that supply the brain with blood. Allow one hour for this exam. There are no restrictions or special instructions.   Follow-Up: At [facility], you and your health needs are our priority.  As part of our continuing mission to provide you with exceptional heart care, we have created designated Provider Care Teams.  These Care Teams include your primary Cardiologist (physician) and Advanced Practice Providers (APPs -  Physician Assistants and Nurse Practitioners) who all work together to provide you with the care you need, when you need it.  We recommend signing up for the patient portal called "MyChart".  Sign up information is provided on this After Visit Summary.  MyChart is used to connect with patients for Virtual Visits (Telemedicine).  Patients are able to view lab/test results, encounter notes, upcoming appointments, etc.  Non-urgent messages can be sent to your provider as well.   To learn more about what you can do with MyChart, go to [email or web address].    Your next appointment:   6 month(s)  The format for your next appointment:   In Person  Provider:   Quay Burow, MD

## 2020-07-25 NOTE — Assessment & Plan Note (Signed)
History of CAD status post CABG in 2006 with a LIMA to his LAD, vein to the RCA, circumflex and diagonal branches.  He had outpatient cath 07/24/2018 revealing 99% stenosis in the body of the vein graft was diagonal branch which was stented with 2 overlapping synergy drug-eluting stents.  The vein to his RCA was occluded although the RCA was widely patent.  The LIMA was patent as well.  He had a 70% smooth stenosis which did not appear to be physiologically significant in the body of his obtuse marginal branch vein graft.  He had repeat cath performed 05/28/2019 revealing occlusion of his diagonal branch vein graft with otherwise unchanged anatomy.  Because of progressive angina I elected to catheterize him again 07/10/2020 revealing unchanged anatomy.  His obtuse marginal branch vein graft had approxi-50% stenosis in the midportion.  He says his pain is somewhat improved.

## 2020-07-25 NOTE — Progress Notes (Signed)
07/25/2020 SRIHITH AQUILINO   Nov 10, 1940  [id number]  Primary Physician [name], MD Primary Cardiologist: [name] MD FACP, [place], [place], [place]  HPI:  [name] is a 80 y.o.  thin and fit-appearing married Caucasian male with no children, who I lassaw in the office  06/25/2020.[name] He has a history of CAD and PVOD. He had coronary artery bypass grafting in 2006 with a LIMA to his LAD, a vein to the RCA, circumflex, and diagonal branches. He did develop paroxysmal atrial fibrillation postoperatively. He has PVOD, status post right carotid endarterectomy performed by Dr. [name], as well as a known aortoiliac and left SFA disease, but he denies claudication except after walking seven or eight blocks in his left calf. His other problems include treated hypertension and dyslipidemia. He had a catheterization July 13, 2010, and found to have patent grafts with moderate aortoiliac disease. He does have moderate renal insufficiency followed by Dr. [name]. He has had laparoscopic cholecystectomy that was uncomplicated in the past.. He did have a negative Myoview stress test 8/14.[name] He does exercise on treadmill for 25 minutes a day with minimal claudication  He did have a left renal artery stent placed by Dr. [name] 2/19 at the request of Dr. [name] .He was hospitalized 05/01/2018 for 1 day because of dizziness and was found to have some symptomatic bradycardia. Beta-blocker was discontinued and subsequent event monitor showed no arrhythmias.  He had a Myoview stress test performed 07/19/2018 that showed ischemia in the the diagonal branch territory. This led to the outpatient cath performed 07/24/2018 revealing a 99% stenosis in the body of the vein graft to diagonal branch which was stented with 2 overlapping synergy drug-eluting stents. His RCA vein graft is occluded but his RCA was widely patent. His LIMA was patent and the vein to the obtuse marginal  branch had a 70% smooth stenosis which did not appear to be physiologically significant. His symptoms have significantly resolved though he still has some epigastric fullness.  He had noticedsome exertional chest tightness while on the treadmill as well as some fatigue. It should be noted that his symptoms resolved back in February after I stented his diagonal branch vein graft.I have prescribed Imdur and was planning on seeing back in follow-up however he failed to fill the prescription because of fear of dizziness. He continues to have exertional chest pressure while walking on the treadmill.  Because of ongoing symptoms I elected to proceed with outpatient diagnostic coronary angiography 05/28/2019 revealing occluded diagonal branch vein graft with otherwise unchanged anatomy. I elected to treat him medically at that time. He has rare angina. He does also complain of rare left calf claudication with a slightly low lower left ABI than the right.  Since I saw him 6 months ago he has developed some progressive effort angina.  I am concerned this may represent progression of his circumflex obtuse marginal branch vein graft disease.  He also has some left calf claudication with progression of disease in his left common iliac artery as well as left SFA.  He does have moderate renal insufficiency.    I performed outpatient diagnostic coronary angiography 07/10/2020 revealing unchanged anatomy compared to his prior cath.  I also did an abdominal aortogram and bilateral iliac angiogram.  He had a small distal abdominal aorta but no significant iliac disease.   Current Meds  Medication Sig  . acyclovir (ZOVIRAX) 400 MG tablet Take 1 tablet (400 mg total) by  mouth 5 (five) times daily. (Patient taking differently: Take 400 mg by mouth 5 (five) times daily as needed (cold sores/fever blisters.).)  . aspirin EC 81 MG tablet Take 81 mg by mouth daily at 12 noon.   . Choline Fenofibrate (FENOFIBRIC  ACID) 45 MG CPDR TAKE ONE CAPSULE BY MOUTH EVERY NIGHT AT BEDTIME (Patient taking differently: Take 45 mg by mouth at bedtime. TAKE ONE CAPSULE BY MOUTH EVERY NIGHT AT BEDTIME)  . clopidogrel (PLAVIX) 75 MG tablet TAKE ONE TABLET BY MOUTH DAILY WITH BREAKFAST (Patient taking differently: Take 75 mg by mouth daily.)  . dimenhyDRINATE (DRAMAMINE) 50 MG tablet Take 50 mg by mouth every 8 (eight) hours as needed (vertigo).  [name] docusate sodium (COLACE) 100 MG capsule Take 100 mg by mouth 2 (two) times daily as needed (constipation.).  [name] ezetimibe (ZETIA) 10 MG tablet Take 1 tablet (10 mg total) by mouth daily. (Patient taking differently: Take 10 mg by mouth every evening.)  . furosemide (LASIX) 20 MG tablet Take 10 mg by mouth daily.  . isosorbide mononitrate (IMDUR) 60 MG 24 hr tablet TAKE ONE TABLET BY MOUTH DAILY (Patient taking differently: Take 60 mg by mouth daily after lunch.)  . meclizine (ANTIVERT) 25 MG tablet Take 1 tablet (25 mg total) by mouth 3 (three) times daily as needed for dizziness. (Patient taking differently: Take 12.5 mg by mouth 3 (three) times daily as needed for dizziness.)  . metoprolol tartrate (LOPRESSOR) 25 MG tablet TAKE 1/2 TABLET BY MOUTH TWO TIMES A DAY (Patient taking differently: Take 12.5 mg by mouth in the morning and at bedtime.)  . nitroGLYCERIN (NITROSTAT) 0.4 MG SL tablet Place 1 tablet (0.4 mg total) under the tongue every 5 (five) minutes as needed for chest pain. (Patient taking differently: Place 0.4 mg under the tongue every 5 (five) minutes x 3 doses as needed for chest pain.)  . pantoprazole (PROTONIX) 40 MG tablet TAKE ONE TABLET BY MOUTH DAILY (Patient taking differently: Take 40 mg by mouth daily as needed (acid reflux.).)  . simvastatin (ZOCOR) 80 MG tablet TAKE ONE TABLET BY MOUTH DAILY AT 6 PM (Patient taking differently: Take 80 mg by mouth at bedtime.)  . tetrahydrozoline 0.05 % ophthalmic solution Place 1-2 drops into both eyes 3 (three) times daily as  needed (dry/irritated eyes).   Current Facility-Administered Medications for the 07/25/20 encounter (Office Visit) with [name], MD  Medication  . sodium chloride flush (NS) 0.9 % injection 3 mL     Allergies  Allergen Reactions  . Adhesive [Tape] Rash  . Latex Rash    Social History   Socioeconomic History  . Marital status: Married    Spouse name: [name]  . Number of children: 0  . Years of education: Not on file  . Highest education level: Not on file  Occupational History  . Occupation: Retired    Comment: Theme park manager: RETIRED  Tobacco Use  . Smoking status: Never Smoker  . Smokeless tobacco: Never Used  Vaping Use  . Vaping Use: Never used  Substance and Sexual Activity  . Alcohol use: Yes    Alcohol/week: 0.0 standard drinks    Comment: rarely  . Drug use: No  . Sexual activity: Yes  Other Topics Concern  . Not on file  Social History Narrative   Last name is pronounced "cas ka" No caffeine use. Always uses seat belts. Exercise:Moderate walks 30 min daily and rides a bike. Uses treadmill 20 mins everyday.  Smoke alarm in home, No guns in the home.      Marital status:  Married x 12 years: happily married. Most of family in [place].      Children; none      Lives: with wife      Employment: retired.      Tobacco; never       Alcohol: wine after lunch daily.      Exercise:  Treadmill 20 minutes per day seven days per week.  Also walking throughout the day.      Advanced Directives: YES: FULL CODE no prolonged measures.      ADLs:  Independent with ADLs; no assistant devices.  Drives.    Social Determinants of Health   Financial Resource Strain: Not on file  Food Insecurity: Not on file  Transportation Needs: Not on file  Physical Activity: Not on file  Stress: Not on file  Social Connections: Not on file  Intimate Partner Violence: Not on file     Review of Systems: General: negative for chills, fever, night sweats or weight  changes.  Cardiovascular: negative for chest pain, dyspnea on exertion, edema, orthopnea, palpitations, paroxysmal nocturnal dyspnea or shortness of breath Dermatological: negative for rash Respiratory: negative for cough or wheezing Urologic: negative for hematuria Abdominal: negative for nausea, vomiting, diarrhea, bright red blood per rectum, melena, or hematemesis Neurologic: negative for visual changes, syncope, or dizziness All other systems reviewed and are otherwise negative except as noted above.    Blood pressure (!) 150/83, pulse 65, height 5\' 9"  (1.753 m), weight 170 lb 9.6 oz (77.4 kg), SpO2 96 %.  General appearance: alert and no distress Neck: no adenopathy, no carotid bruit, no JVD, supple, symmetrical, trachea midline and thyroid not enlarged, symmetric, no tenderness/mass/nodules Lungs: clear to auscultation bilaterally Heart: regular rate and rhythm, S1, S2 normal, no murmur, click, rub or gallop Extremities: extremities normal, atraumatic, no cyanosis or edema Pulses: 2+ and symmetric Skin: Skin color, texture, turgor normal. No rashes or lesions Neurologic: Alert and oriented X 3, normal strength and tone. Normal symmetric reflexes. Normal coordination and gait  EKG not performed today  ASSESSMENT AND PLAN:   Coronary artery disease due to lipid rich plaque History of CAD status post CABG in 2006 with a LIMA to his LAD, vein to the RCA, circumflex and diagonal branches.  He had outpatient cath 07/24/2018 revealing 99% stenosis in the body of the vein graft was diagonal branch which was stented with 2 overlapping synergy drug-eluting stents.  The vein to his RCA was occluded although the RCA was widely patent.  The LIMA was patent as well.  He had a 70% smooth stenosis which did not appear to be physiologically significant in the body of his obtuse marginal branch vein graft.  He had repeat cath performed 05/28/2019 revealing occlusion of his diagonal branch vein graft  with otherwise unchanged anatomy.  Because of progressive angina I elected to catheterize him again 07/10/2020 revealing unchanged anatomy.  His obtuse marginal branch vein graft had approxi-50% stenosis in the midportion.  He says his pain is somewhat improved.  Claudication in peripheral vascular disease ([place]) History of claudication with lower extremity arterial Doppler studies suggesting iliac disease although at the time of his recent cardiac cath I did perform abdominal aortography and bilateral iliac angiography is revealed a small distal abdominal aorta with no significant iliac disease.  His lower extremity arterial Doppler studies did however suggest a lesion in his left SFA performed 05/26/2020  with a right ABI of 0.92 and a left of 0.72 although he really denies claudication.  We will continue to follow this noninvasively.  Carotid artery disease ([place]) History of carotid artery disease with Dopplers performed 05/26/2020 revealing moderate left ICA stenosis.  This will be repeated on annual basis.      [name] MD FACP,FACC,FAHA, [facility] 07/25/2020 3:33 PM

## 2020-07-30 DIAGNOSIS — D2271 Melanocytic nevi of right lower limb, including hip: Secondary | ICD-10-CM | POA: Diagnosis not present

## 2020-07-30 DIAGNOSIS — L821 Other seborrheic keratosis: Secondary | ICD-10-CM | POA: Diagnosis not present

## 2020-07-30 DIAGNOSIS — D1801 Hemangioma of skin and subcutaneous tissue: Secondary | ICD-10-CM | POA: Diagnosis not present

## 2020-07-30 DIAGNOSIS — D485 Neoplasm of uncertain behavior of skin: Secondary | ICD-10-CM | POA: Diagnosis not present

## 2020-07-30 DIAGNOSIS — L570 Actinic keratosis: Secondary | ICD-10-CM | POA: Diagnosis not present

## 2020-07-30 DIAGNOSIS — L820 Inflamed seborrheic keratosis: Secondary | ICD-10-CM | POA: Diagnosis not present

## 2020-07-30 DIAGNOSIS — Z85828 Personal history of other malignant neoplasm of skin: Secondary | ICD-10-CM | POA: Diagnosis not present

## 2020-08-06 DIAGNOSIS — C44329 Squamous cell carcinoma of skin of other parts of face: Secondary | ICD-10-CM | POA: Diagnosis not present

## 2020-08-07 ENCOUNTER — Telehealth: Payer: Self-pay | Admitting: Cardiovascular Disease

## 2020-08-07 DIAGNOSIS — H0289 Other specified disorders of eyelid: Secondary | ICD-10-CM | POA: Diagnosis not present

## 2020-08-07 NOTE — Telephone Encounter (Signed)
Patient states on Wednesday 08/06/20 he had a mole removed from his face. 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 He states they told him it may make him have a drooping eyelid so they want to move skin over to bolster the cheek under the eyelid. He states the office is not requesting clearance, he just wants to know if Dr. Gwenlyn Found is okay with this.

## 2020-08-07 NOTE — Telephone Encounter (Signed)
Spoke with pt, aware dr berry is not in the office but will forward for his review.

## 2020-08-08 DIAGNOSIS — C44319 Basal cell carcinoma of skin of other parts of face: Secondary | ICD-10-CM | POA: Diagnosis not present

## 2020-08-08 DIAGNOSIS — Z951 Presence of aortocoronary bypass graft: Secondary | ICD-10-CM | POA: Diagnosis not present

## 2020-08-08 DIAGNOSIS — Z85828 Personal history of other malignant neoplasm of skin: Secondary | ICD-10-CM | POA: Diagnosis not present

## 2020-08-08 DIAGNOSIS — Z955 Presence of coronary angioplasty implant and graft: Secondary | ICD-10-CM | POA: Diagnosis not present

## 2020-08-08 DIAGNOSIS — Z7901 Long term (current) use of anticoagulants: Secondary | ICD-10-CM | POA: Diagnosis not present

## 2020-08-08 DIAGNOSIS — Z428 Encounter for other plastic and reconstructive surgery following medical procedure or healed injury: Secondary | ICD-10-CM | POA: Diagnosis not present

## 2020-08-08 DIAGNOSIS — K219 Gastro-esophageal reflux disease without esophagitis: Secondary | ICD-10-CM | POA: Diagnosis not present

## 2020-08-08 DIAGNOSIS — E785 Hyperlipidemia, unspecified: Secondary | ICD-10-CM | POA: Diagnosis not present

## 2020-08-08 DIAGNOSIS — Z483 Aftercare following surgery for neoplasm: Secondary | ICD-10-CM | POA: Diagnosis not present

## 2020-08-08 DIAGNOSIS — I25110 Atherosclerotic heart disease of native coronary artery with unstable angina pectoris: Secondary | ICD-10-CM | POA: Diagnosis not present

## 2020-08-08 DIAGNOSIS — Z7982 Long term (current) use of aspirin: Secondary | ICD-10-CM | POA: Diagnosis not present

## 2020-08-08 NOTE — Telephone Encounter (Signed)
Thx for making me azare

## 2020-08-08 NOTE — Telephone Encounter (Signed)
Unable to reach pt or leave a message  

## 2020-08-26 DIAGNOSIS — I701 Atherosclerosis of renal artery: Secondary | ICD-10-CM | POA: Diagnosis not present

## 2020-08-26 DIAGNOSIS — N058 Unspecified nephritic syndrome with other morphologic changes: Secondary | ICD-10-CM | POA: Diagnosis not present

## 2020-08-26 DIAGNOSIS — N189 Chronic kidney disease, unspecified: Secondary | ICD-10-CM | POA: Diagnosis not present

## 2020-08-26 DIAGNOSIS — N057 Unspecified nephritic syndrome with diffuse crescentic glomerulonephritis: Secondary | ICD-10-CM | POA: Diagnosis not present

## 2020-08-26 DIAGNOSIS — T8131XA Disruption of external operation (surgical) wound, not elsewhere classified, initial encounter: Secondary | ICD-10-CM | POA: Diagnosis not present

## 2020-08-26 DIAGNOSIS — I129 Hypertensive chronic kidney disease with stage 1 through stage 4 chronic kidney disease, or unspecified chronic kidney disease: Secondary | ICD-10-CM | POA: Diagnosis not present

## 2020-08-26 DIAGNOSIS — N1831 Chronic kidney disease, stage 3a: Secondary | ICD-10-CM | POA: Diagnosis not present

## 2020-08-26 DIAGNOSIS — D631 Anemia in chronic kidney disease: Secondary | ICD-10-CM | POA: Diagnosis not present

## 2020-08-28 ENCOUNTER — Other Ambulatory Visit: Payer: Self-pay

## 2020-08-28 MED ORDER — ISOSORBIDE MONONITRATE ER 60 MG PO TB24: 60.0000 mg | ORAL_TABLET | Freq: Every day | ORAL | 1 refills | Status: DC

## 2020-10-03 ENCOUNTER — Other Ambulatory Visit: Payer: Self-pay

## 2020-10-03 MED ORDER — SIMVASTATIN 80 MG PO TABS: ORAL_TABLET | ORAL | 2 refills | Status: DC

## 2020-10-08 DIAGNOSIS — H6123 Impacted cerumen, bilateral: Secondary | ICD-10-CM | POA: Diagnosis not present

## 2020-10-24 ENCOUNTER — Other Ambulatory Visit: Payer: Self-pay

## 2020-10-24 MED ORDER — CLOPIDOGREL BISULFATE 75 MG PO TABS: 1.0000 | ORAL_TABLET | Freq: Every day | ORAL | 3 refills | Status: DC

## 2020-11-12 ENCOUNTER — Other Ambulatory Visit: Payer: Self-pay | Admitting: General Practice

## 2020-11-12 DIAGNOSIS — D1801 Hemangioma of skin and subcutaneous tissue: Secondary | ICD-10-CM | POA: Diagnosis not present

## 2020-11-12 DIAGNOSIS — L570 Actinic keratosis: Secondary | ICD-10-CM | POA: Diagnosis not present

## 2020-11-12 DIAGNOSIS — L304 Erythema intertrigo: Secondary | ICD-10-CM | POA: Diagnosis not present

## 2020-11-12 DIAGNOSIS — Z85828 Personal history of other malignant neoplasm of skin: Secondary | ICD-10-CM | POA: Diagnosis not present

## 2020-11-12 DIAGNOSIS — L821 Other seborrheic keratosis: Secondary | ICD-10-CM | POA: Diagnosis not present

## 2020-12-10 DIAGNOSIS — Z6825 Body mass index (BMI) 25.0-25.9, adult: Secondary | ICD-10-CM | POA: Diagnosis not present

## 2020-12-10 DIAGNOSIS — C4A61 Merkel cell carcinoma of right upper limb, including shoulder: Secondary | ICD-10-CM | POA: Diagnosis not present

## 2020-12-12 ENCOUNTER — Encounter: Payer: Self-pay | Admitting: Internal Medicine

## 2020-12-12 ENCOUNTER — Other Ambulatory Visit: Payer: Self-pay

## 2020-12-12 ENCOUNTER — Ambulatory Visit (INDEPENDENT_AMBULATORY_CARE_PROVIDER_SITE_OTHER): Payer: Medicare Other

## 2020-12-12 ENCOUNTER — Ambulatory Visit (INDEPENDENT_AMBULATORY_CARE_PROVIDER_SITE_OTHER): Payer: Medicare Other | Admitting: Internal Medicine

## 2020-12-12 VITALS — BP 134/76 | HR 66 | Temp 98.3°F | Resp 18 | Ht 69.0 in | Wt 169.4 lb

## 2020-12-12 DIAGNOSIS — H900 Conductive hearing loss, bilateral: Secondary | ICD-10-CM | POA: Diagnosis not present

## 2020-12-12 DIAGNOSIS — E7800 Pure hypercholesterolemia, unspecified: Secondary | ICD-10-CM

## 2020-12-12 DIAGNOSIS — N059 Unspecified nephritic syndrome with unspecified morphologic changes: Secondary | ICD-10-CM | POA: Diagnosis not present

## 2020-12-12 DIAGNOSIS — N1831 Chronic kidney disease, stage 3a: Secondary | ICD-10-CM

## 2020-12-12 DIAGNOSIS — M79672 Pain in left foot: Secondary | ICD-10-CM | POA: Diagnosis not present

## 2020-12-12 DIAGNOSIS — A6001 Herpesviral infection of penis: Secondary | ICD-10-CM

## 2020-12-12 DIAGNOSIS — I10 Essential (primary) hypertension: Secondary | ICD-10-CM

## 2020-12-12 DIAGNOSIS — R001 Bradycardia, unspecified: Secondary | ICD-10-CM | POA: Diagnosis not present

## 2020-12-12 DIAGNOSIS — I6522 Occlusion and stenosis of left carotid artery: Secondary | ICD-10-CM

## 2020-12-12 DIAGNOSIS — M79675 Pain in left toe(s): Secondary | ICD-10-CM | POA: Diagnosis not present

## 2020-12-12 DIAGNOSIS — Z0001 Encounter for general adult medical examination with abnormal findings: Secondary | ICD-10-CM | POA: Insufficient documentation

## 2020-12-12 MED ORDER — ACYCLOVIR 400 MG PO TABS: 400.0000 mg | ORAL_TABLET | ORAL | 3 refills | Status: AC | PRN

## 2020-12-12 NOTE — Assessment & Plan Note (Signed)
Rx acyclovir.

## 2020-12-12 NOTE — Assessment & Plan Note (Signed)
BP at goal overall with a few rare elevated readings. Taking metoprolol 12.5 mg BID and imdur and lasix. Recent renal function stable.

## 2020-12-12 NOTE — Assessment & Plan Note (Signed)
States he bought a hearing aid through the mail and never really wears. Does not want to pursue formal hearing aid evaluation.

## 2020-12-12 NOTE — Assessment & Plan Note (Signed)
Checking x-ray left foot to evaluate given shampoo bottle fell on his foot for occult fracture.

## 2020-12-12 NOTE — Assessment & Plan Note (Signed)
Most recent lipid panel 03/2020 at goal and on zetia and simvastatin. No labs today.

## 2020-12-12 NOTE — Assessment & Plan Note (Signed)
Seeing nephrology for labs regularly and stable. BP at goal and no known diabetes.

## 2020-12-12 NOTE — Assessment & Plan Note (Signed)
Flu shot yearly. Covid-19 up to date counseled about future boosters. Pneumonia declines at this time. Shingrix counseled to get at pharmacy. Tetanus due later 2022 advised to get at pharmacy. Colonoscopy aged out. Counseled about sun safety and mole surveillance. Counseled about the dangers of distracted driving. Given 10 year screening recommendations.

## 2020-12-12 NOTE — Patient Instructions (Addendum)
Let us know if you want to do the ultrasound of the neck.  We do not need labs today.   We will check the x-ray of the left foot.  You could get a booster shot for the covid-19 vaccine.

## 2020-12-12 NOTE — Progress Notes (Signed)
Subjective:   Patient ID: [name], male    DOB: 1941/01/05, 80 y.o.   MRN: [id number]  HPI Here for medicare wellness, with new complaints. Please see A/P for status and treatment of chronic medical problems.   HPI #2: Here for left foot pain (dropped shampoo on it some time again, hurting intermittent, more with prolonged activity, changed shoes which has helped some), and follow up herpes simplex (needs refill acyclovir), and glomerulonephritis (seeing urology and state they check his labs, most recent creatinine fairly stable around 1.6.   Diet: heart healthy Physical activity: sedentary Depression/mood screen: negative Hearing: left with significant loss, right mild loss Visual acuity: grossly normal with lens, performs annual eye exam  ADLs: capable Fall risk: none Home safety: good Cognitive evaluation: intact to orientation, naming, recall and repetition EOL planning: adv directives discussed, in place  [employer] Visit from 12/12/2020 in [place] at [facility] Total Score 0       I have personally reviewed and have noted 1. The patient's medical and social history - reviewed today no changes 2. Their use of alcohol, tobacco or illicit drugs 3. Their current medications and supplements 4. The patient's functional ability including ADL's, fall risks, home safety risks and hearing or visual impairment. 5. Diet and physical activities 6. Evidence for depression or mood disorders 7. Care team reviewed and updated 8.  The patient is not on an opioid pain medication.  Patient Care Team: [name], MD as PCP - General (Internal Medicine) [name], MD as PCP - Cardiology (Cardiology) Donato Heinz, MD as Consulting Physician (Nephrology) [name], MD as Consulting Physician (Cardiology) [name], MD as Consulting Physician (Dermatology) Melissa Montane, MD as Consulting Physician (Otolaryngology) Past  Medical History:  Diagnosis Date   Anemia, unspecified    Basal cell carcinoma (BCC) of right forearm    [name]   Benign paroxysmal positional vertigo    Blood transfusion without reported diagnosis    CAD (coronary artery disease)    HX CABG 2006 X 4; last nuc 11/2010 last cath 2012-wth 95% OM too small for intervention, treated medically    Carcinoma in situ of skin, site unspecified    Cerebrovascular disease, unspecified    hx CEA-RT; KNOWN AOTOILIAC 7 LT sfa disease   Chronic renal insufficiency    glomerulonephritis 3716967.8   Complication of anesthesia    Congenital anomaly of tongue, unspecified    Esophageal reflux    Essential hypertension, benign    Genital herpes, unspecified    History of stress test 06/2010   Hx of echocardiogram 01/2005   EF 55% normal study   Lung mass    PAF (paroxysmal atrial fibrillation) ([place])    post CABG, maintaining SR   Peripheral vascular disease, unspecified ([place])    PONV (postoperative nausea and vomiting)    Pure hypercholesterolemia    Past Surgical History:  Procedure Laterality Date   ABDOMINAL AORTOGRAM W/LOWER EXTREMITY N/A 07/10/2020   Procedure: ABDOMINAL AORTOGRAM W/LOWER EXTREMITY;  Surgeon: [name], MD;  Location: [facility];  Service: Cardiovascular;  Laterality: N/A;   CARDIAC CATHETERIZATION  06/2010   CAROTID ENDARTERECTOMY  2007   Right   CHOLECYSTECTOMY  07/2010   CORONARY ARTERY BYPASS GRAFT  05/12/2005   LIMA-LAD, VG-DIAG; VG-LCX,OM; VG-RCA   CORONARY STENT INTERVENTION N/A 07/24/2018   Procedure: CORONARY STENT INTERVENTION;  Surgeon: [name], MD;  Location: [facility];  Service: Cardiovascular;  Laterality: N/A;   IR RADIOLOGIST EVAL & MGMT  07/13/2017   IR RADIOLOGIST EVAL & MGMT  09/20/2017   IR RENAL BILAT S&I MOD SED  08/09/2017   IR TRANSCATH PLC STENT 1ST ART NOT LE CV CAR VERT CAR  08/09/2017   IR US GUIDE VASC ACCESS RIGHT  08/09/2017   LEFT HEART CATH AND CORS/GRAFTS  ANGIOGRAPHY N/A 07/24/2018   Procedure: LEFT HEART CATH AND CORS/GRAFTS ANGIOGRAPHY;  Surgeon: [name], MD;  Location: [facility];  Service: Cardiovascular;  Laterality: N/A;   LEFT HEART CATH AND CORS/GRAFTS ANGIOGRAPHY N/A 05/28/2019   Procedure: LEFT HEART CATH AND CORS/GRAFTS ANGIOGRAPHY;  Surgeon: [name], MD;  Location: [facility];  Service: Cardiovascular;  Laterality: N/A;   LEFT HEART CATH AND CORS/GRAFTS ANGIOGRAPHY N/A 07/10/2020   Procedure: LEFT HEART CATH AND CORS/GRAFTS ANGIOGRAPHY;  Surgeon: [name], MD;  Location: [facility];  Service: Cardiovascular;  Laterality: N/A;   Family History  Problem Relation Age of Onset   Diabetes Mother    Cancer Brother 25       colon cancer    Review of Systems  Constitutional: Negative.   HENT: Negative.    Eyes: Negative.   Respiratory:  Negative for cough, chest tightness and shortness of breath.   Cardiovascular:  Negative for chest pain, palpitations and leg swelling.  Gastrointestinal:  Negative for abdominal distention, abdominal pain, constipation, diarrhea, nausea and vomiting.  Musculoskeletal: Negative.   Skin: Negative.   Neurological: Negative.   Psychiatric/Behavioral: Negative.     Objective:  Physical Exam Constitutional:      Appearance: He is well-developed.  HENT:     Head: Normocephalic and atraumatic.  Cardiovascular:     Rate and Rhythm: Normal rate and regular rhythm.  Pulmonary:     Effort: Pulmonary effort is normal. No respiratory distress.     Breath sounds: Normal breath sounds. No wheezing or rales.  Abdominal:     General: Bowel sounds are normal. There is no distension.     Palpations: Abdomen is soft.     Tenderness: There is no abdominal tenderness. There is no rebound.  Musculoskeletal:     Cervical back: Normal range of motion.     Comments: Left foot pain great toe distal joint  Skin:    General: Skin is warm and dry.  Neurological:      Mental Status: He is alert and oriented to person, place, and time.     Coordination: Coordination normal.    Vitals:   12/12/20 1336  BP: 134/76  Pulse: 66  Resp: 18  Temp: 98.3 F (36.8 C)  TempSrc: Oral  SpO2: 98%  Weight: 169 lb 6.4 oz (76.8 kg)  Height: 5\' 9"  (1.753 m)   This visit occurred during the SARS-CoV-2 public health emergency.  Safety protocols were in place, including screening questions prior to the visit, additional usage of staff PPE, and extensive cleaning of exam room while observing appropriate contact time as indicated for disinfecting solutions.   Assessment & Plan:

## 2020-12-12 NOTE — Assessment & Plan Note (Signed)
Seeing nephrology and overall stable renal function at this time. No symptoms.

## 2020-12-12 NOTE — Assessment & Plan Note (Signed)
Stable and on metoprolol 12.5 mg BID.

## 2020-12-13 ENCOUNTER — Other Ambulatory Visit: Payer: Self-pay | Admitting: General Practice

## 2020-12-13 DIAGNOSIS — I2510 Atherosclerotic heart disease of native coronary artery without angina pectoris: Secondary | ICD-10-CM

## 2020-12-17 ENCOUNTER — Other Ambulatory Visit: Payer: Self-pay | Admitting: General Practice

## 2020-12-17 ENCOUNTER — Telehealth: Payer: Self-pay | Admitting: Internal Medicine

## 2020-12-17 NOTE — Telephone Encounter (Signed)
Unable to get in contact with the patient due to the phone constantly ringing. I will attempt to call back later.

## 2020-12-17 NOTE — Telephone Encounter (Signed)
1.Medication Requested: pantoprazole (PROTONIX) 40 MG tablet   2. Pharmacy (Name, Street, [facility]): [facility] [id number] - [place], [place]  3. On Med List: yes   4. Last Visit with PCP: 12-12-20  5. Next visit date with PCP: n/a   Patient called and said that he is going out of town on Friday and is wanting to pick up medication tomorrow. Please advise    Agent: Please be advised that RX refills may take up to 3 business days. We ask that you follow-up with your pharmacy.

## 2021-01-07 ENCOUNTER — Other Ambulatory Visit: Payer: Self-pay

## 2021-01-07 ENCOUNTER — Ambulatory Visit (INDEPENDENT_AMBULATORY_CARE_PROVIDER_SITE_OTHER): Payer: Medicare Other | Admitting: Cardiovascular Disease

## 2021-01-07 ENCOUNTER — Encounter: Payer: Self-pay | Admitting: Cardiovascular Disease

## 2021-01-07 VITALS — BP 102/68 | HR 69 | Ht 69.0 in | Wt 171.2 lb

## 2021-01-07 DIAGNOSIS — I10 Essential (primary) hypertension: Secondary | ICD-10-CM

## 2021-01-07 DIAGNOSIS — E7800 Pure hypercholesterolemia, unspecified: Secondary | ICD-10-CM

## 2021-01-07 DIAGNOSIS — I6522 Occlusion and stenosis of left carotid artery: Secondary | ICD-10-CM

## 2021-01-07 DIAGNOSIS — I739 Peripheral vascular disease, unspecified: Secondary | ICD-10-CM | POA: Diagnosis not present

## 2021-01-07 DIAGNOSIS — I2583 Coronary atherosclerosis due to lipid rich plaque: Secondary | ICD-10-CM

## 2021-01-07 DIAGNOSIS — I2510 Atherosclerotic heart disease of native coronary artery without angina pectoris: Secondary | ICD-10-CM

## 2021-01-07 NOTE — Assessment & Plan Note (Signed)
History of CAD status post coronary artery bypass grafting in 2006 with a LIMA to his LAD, vein to the RCA, circumflex and diagonal branches.  Given I performed cath on him 07/24/2018 revealing 99% stenosis in the body of the vein graft to the diagonal branch which I stented with 2 overlapping drug-eluting stents.  The RCA graft was occluded but his RCA was widely patent.  The LIMA was patent and the vein to the obtuse marginal branch had a 70% smooth stenosis which did not appear to be physiologically significant.  I re- angiogram him 07/10/2020 because of chest pain that revealed similar anatomy to the cath performed 05/28/2019 with an occluded vein to the RCA and to the diagonal branch.  He has chronic mild and occasional angina.

## 2021-01-07 NOTE — Assessment & Plan Note (Signed)
History of hyperlipidemia on fenofibrate, and Zetia and simvastatin with lipid profile performed 03/25/2020 revealing total cholesterol 143, LDL of 81 and HDL 38.

## 2021-01-07 NOTE — Assessment & Plan Note (Signed)
History of essential hypertension a blood pressure measured today at 102/68.  He is on metoprolol.

## 2021-01-07 NOTE — Progress Notes (Signed)
01/07/2021 [name]   April 25, 1941  [id number]  Primary Physician Jose Koch, MD Primary Cardiologist: Jose Harp MD FACP, [place], [place], [place]  HPI:  [name] is a 80 y.o.  thin and fit-appearing married Caucasian male with no children, who I las saw in the office 07/25/2020.[name] He has a history of CAD and PVOD. He had coronary artery bypass grafting in 2006 with a LIMA to his LAD, a vein to the RCA, circumflex, and diagonal branches. He did develop paroxysmal atrial fibrillation postoperatively. He has PVOD, status post right carotid endarterectomy performed by Dr. Shanon Price, as well as a known aortoiliac and left SFA disease, but he denies claudication except after walking seven or eight blocks in his left calf. His other problems include treated hypertension and dyslipidemia. He had a catheterization July 13, 2010, and found to have patent grafts with moderate aortoiliac disease. He does have moderate renal insufficiency followed by Dr. Marval Price. He has had laparoscopic cholecystectomy that was uncomplicated in the past.. He did have a negative Myoview stress test 8/14.[name] He does exercise on treadmill for 25 minutes a day with minimal claudication     He did have a left renal artery stent placed by Dr. Kathlene Price 2/19 at the request of Dr. Marval Price .  He was hospitalized 05/01/2018 for 1 day because of dizziness and was found to have some symptomatic bradycardia.  Beta-blocker was discontinued and subsequent event monitor showed no arrhythmias.   He had a Myoview stress test performed 07/19/2018 that showed ischemia in the the diagonal branch territory.  This led to the outpatient cath performed 07/24/2018 revealing a 99% stenosis in the body of the vein graft to diagonal branch which was stented with 2 overlapping synergy drug-eluting stents.  His RCA vein graft is occluded but his RCA was widely patent.  His LIMA was patent and the vein to the obtuse marginal  branch had a 70% smooth stenosis which did not appear to be physiologically significant.  His symptoms have significantly resolved though he still has some epigastric fullness.   He had noticed  some exertional chest tightness while on the treadmill as well as some fatigue.  It should be noted that his symptoms resolved back in February after I stented his diagonal branch vein graft.  I have prescribed Imdur and was planning on seeing back in follow-up however he failed to fill the prescription because of fear of dizziness.  He continues to have exertional chest pressure while walking on the treadmill.   Because of ongoing symptoms I elected to proceed with outpatient diagnostic coronary angiography 05/28/2019 revealing occluded diagonal branch vein graft with otherwise unchanged anatomy.  I elected to treat him medically at that time.  He has rare angina.  He does also complain of rare left calf claudication with a slightly low lower left ABI than the right.   Since I saw him 6 months ago he has developed some progressive effort angina.  I am concerned this may represent progression of his circumflex obtuse marginal branch vein graft disease.  He also has some left calf claudication with progression of disease in his left common iliac artery as well as left SFA.  He does have moderate renal insufficiency.     I performed outpatient diagnostic coronary angiography 07/10/2020 revealing unchanged anatomy compared to his prior cath.  I also did an abdominal aortogram and bilateral iliac angiogram.  He had a small distal abdominal aorta but no  significant iliac disease.  Since I saw him 6 months ago he is remained stable.  He does have mild stable effort angina.  He denies claudication.     Current Meds  Medication Sig   aspirin EC 81 MG tablet Take 81 mg by mouth daily at 12 noon.    Choline Fenofibrate (FENOFIBRIC ACID) 45 MG CPDR TAKE ONE CAPSULE BY MOUTH EVERY NIGHT AT BEDTIME   clopidogrel (PLAVIX)  75 MG tablet Take 1 tablet (75 mg total) by mouth daily with breakfast.   dimenhyDRINATE (DRAMAMINE) 50 MG tablet Take 50 mg by mouth every 8 (eight) hours as needed (vertigo).   docusate sodium (COLACE) 100 MG capsule Take 100 mg by mouth 2 (two) times daily as needed (constipation.).   ezetimibe (ZETIA) 10 MG tablet Take 1 tablet (10 mg total) by mouth daily. (Patient taking differently: Take 10 mg by mouth every evening.)   furosemide (LASIX) 20 MG tablet Take 10 mg by mouth daily.   isosorbide mononitrate (IMDUR) 60 MG 24 hr tablet Take 1 tablet (60 mg total) by mouth daily.   meclizine (ANTIVERT) 25 MG tablet Take 1 tablet (25 mg total) by mouth 3 (three) times daily as needed for dizziness. (Patient taking differently: Take 12.5 mg by mouth 3 (three) times daily as needed for dizziness.)   metoprolol tartrate (LOPRESSOR) 25 MG tablet TAKE 1/2 TABLET BY MOUTH TWO TIMES A DAY (Patient taking differently: Take 12.5 mg by mouth in the morning and at bedtime.)   pantoprazole (PROTONIX) 40 MG tablet TAKE ONE TABLET BY MOUTH DAILY   simvastatin (ZOCOR) 80 MG tablet TAKE ONE TABLET BY MOUTH DAILY AT 6 PM   tetrahydrozoline 0.05 % ophthalmic solution Place 1-2 drops into both eyes 3 (three) times daily as needed (dry/irritated eyes).     Allergies  Allergen Reactions   Adhesive [Tape] Rash   Latex Rash    Social History   Socioeconomic History   Marital status: Married    Spouse name: [name]   Number of children: 0   Years of education: Not on file   Highest education level: Not on file  Occupational History   Occupation: Retired    Comment: Theme park manager: RETIRED  Tobacco Use   Smoking status: Never   Smokeless tobacco: Never  Vaping Use   Vaping Use: Never used  Substance and Sexual Activity   Alcohol use: Yes    Alcohol/week: 0.0 standard drinks    Comment: rarely   Drug use: No   Sexual activity: Yes  Other Topics Concern   Not on file  Social History Narrative    Last name is pronounced "cas ka" No caffeine use. Always uses seat belts. Exercise:Moderate walks 30 min daily and rides a bike. Uses treadmill 20 mins everyday. Smoke alarm in home, No guns in the home.      Marital status:  Married x 12 years: happily married. Most of family in [place].      Children; none      Lives: with wife      Employment: retired.      Tobacco; never       Alcohol: wine after lunch daily.      Exercise:  Treadmill 20 minutes per day seven days per week.  Also walking throughout the day.      Advanced Directives: YES: FULL CODE no prolonged measures.      ADLs:  Independent with ADLs; no assistant devices.  Drives.  Social Determinants of Health   Financial Resource Strain: Not on file  Food Insecurity: Not on file  Transportation Needs: Not on file  Physical Activity: Not on file  Stress: Not on file  Social Connections: Not on file  Intimate Partner Violence: Not on file     Review of Systems: General: negative for chills, fever, night sweats or weight changes.  Cardiovascular: negative for chest pain, dyspnea on exertion, edema, orthopnea, palpitations, paroxysmal nocturnal dyspnea or shortness of breath Dermatological: negative for rash Respiratory: negative for cough or wheezing Urologic: negative for hematuria Abdominal: negative for nausea, vomiting, diarrhea, bright red blood per rectum, melena, or hematemesis Neurologic: negative for visual changes, syncope, or dizziness All other systems reviewed and are otherwise negative except as noted above.    Blood pressure 102/68, pulse 69, height '5\' 9"'$  (1.753 m), weight 171 lb 3.2 oz (77.7 kg), SpO2 98 %.  General appearance: alert and no distress Neck: no adenopathy, no carotid bruit, no JVD, supple, symmetrical, trachea midline, and thyroid not enlarged, symmetric, no tenderness/mass/nodules Lungs: clear to auscultation bilaterally Heart: regular rate and rhythm, S1, S2 normal, no murmur, click,  rub or gallop Extremities: extremities normal, atraumatic, no cyanosis or edema Pulses: 2+ and symmetric Skin: Skin color, texture, turgor normal. No rashes or lesions Neurologic: Grossly normal  EKG sinus rhythm at 69 with left axis deviation.  I personally reviewed this EKG.  ASSESSMENT AND PLAN:   Essential hypertension, benign History of essential hypertension a blood pressure measured today at 102/68.  He is on metoprolol.  Pure hypercholesterolemia History of hyperlipidemia on fenofibrate, and Zetia and simvastatin with lipid profile performed 03/25/2020 revealing total cholesterol 143, LDL of 81 and HDL 38.  Coronary artery disease due to lipid rich plaque History of CAD status post coronary artery bypass grafting in 2006 with a LIMA to his LAD, vein to the RCA, circumflex and diagonal branches.  Given I performed cath on him 07/24/2018 revealing 99% stenosis in the body of the vein graft to the diagonal branch which I stented with 2 overlapping drug-eluting stents.  The RCA graft was occluded but his RCA was widely patent.  The LIMA was patent and the vein to the obtuse marginal branch had a 70% smooth stenosis which did not appear to be physiologically significant.  I re- angiogram him 07/10/2020 because of chest pain that revealed similar anatomy to the cath performed 05/28/2019 with an occluded vein to the RCA and to the diagonal branch.  He has chronic mild and occasional angina.  Claudication in peripheral vascular disease ([place]) History of peripheral arterial disease with Dopplers performed 05/26/2020 revealing right ABI of 0.92 and a left of 0.72.  He did have high-frequency signal in his left common iliac and left SFA as well as right common iliac artery.  At the time of catheter earlier this year I also did an abdominal aortogram that did not demonstrate iliac disease.  He really has minimal claudication.  Carotid artery disease ([facility]) History of carotid artery disease with  moderate left ICA stenosis.  This will be repeated in December.     Jose Harp MD FACP,FACC,FAHA, [facility] 01/07/2021 12:13 PM

## 2021-01-07 NOTE — Patient Instructions (Signed)
  Follow-Up: At [facility], you and your health needs are our priority.  As part of our continuing mission to provide you with exceptional heart care, we have created designated Provider Care Teams.  These Care Teams include your primary Cardiologist (physician) and Advanced Practice Providers (APPs -  Physician Assistants and Nurse Practitioners) who all work together to provide you with the care you need, when you need it.  We recommend signing up for the patient portal called "MyChart".  Sign up information is provided on this After Visit Summary.  MyChart is used to connect with patients for Virtual Visits (Telemedicine).  Patients are able to view lab/test results, encounter notes, upcoming appointments, etc.  Non-urgent messages can be sent to your provider as well.   To learn more about what you can do with MyChart, go to [email or web address].    Your next appointment:   6 month(s)  The format for your next appointment:   In Person  Provider:   Quay Burow, MD

## 2021-01-07 NOTE — Assessment & Plan Note (Signed)
History of peripheral arterial disease with Dopplers performed 05/26/2020 revealing right ABI of 0.92 and a left of 0.72.  He did have high-frequency signal in his left common iliac and left SFA as well as right common iliac artery.  At the time of catheter earlier this year I also did an abdominal aortogram that did not demonstrate iliac disease.  He really has minimal claudication.

## 2021-01-07 NOTE — Assessment & Plan Note (Signed)
History of carotid artery disease with moderate left ICA stenosis.  This will be repeated in December.

## 2021-01-28 DIAGNOSIS — I701 Atherosclerosis of renal artery: Secondary | ICD-10-CM | POA: Diagnosis not present

## 2021-01-28 DIAGNOSIS — N189 Chronic kidney disease, unspecified: Secondary | ICD-10-CM | POA: Diagnosis not present

## 2021-01-28 DIAGNOSIS — N1831 Chronic kidney disease, stage 3a: Secondary | ICD-10-CM | POA: Diagnosis not present

## 2021-01-28 DIAGNOSIS — I129 Hypertensive chronic kidney disease with stage 1 through stage 4 chronic kidney disease, or unspecified chronic kidney disease: Secondary | ICD-10-CM | POA: Diagnosis not present

## 2021-01-28 DIAGNOSIS — D631 Anemia in chronic kidney disease: Secondary | ICD-10-CM | POA: Diagnosis not present

## 2021-01-28 DIAGNOSIS — N057 Unspecified nephritic syndrome with diffuse crescentic glomerulonephritis: Secondary | ICD-10-CM | POA: Diagnosis not present

## 2021-01-28 DIAGNOSIS — T8131XA Disruption of external operation (surgical) wound, not elsewhere classified, initial encounter: Secondary | ICD-10-CM | POA: Diagnosis not present

## 2021-01-28 DIAGNOSIS — N058 Unspecified nephritic syndrome with other morphologic changes: Secondary | ICD-10-CM | POA: Diagnosis not present

## 2021-01-29 ENCOUNTER — Other Ambulatory Visit: Payer: Self-pay

## 2021-01-29 MED ORDER — ISOSORBIDE MONONITRATE ER 60 MG PO TB24: 60.0000 mg | ORAL_TABLET | Freq: Every day | ORAL | 1 refills | Status: DC

## 2021-02-12 DIAGNOSIS — L821 Other seborrheic keratosis: Secondary | ICD-10-CM | POA: Diagnosis not present

## 2021-02-12 DIAGNOSIS — L820 Inflamed seborrheic keratosis: Secondary | ICD-10-CM | POA: Diagnosis not present

## 2021-02-12 DIAGNOSIS — D1801 Hemangioma of skin and subcutaneous tissue: Secondary | ICD-10-CM | POA: Diagnosis not present

## 2021-02-12 DIAGNOSIS — Z85828 Personal history of other malignant neoplasm of skin: Secondary | ICD-10-CM | POA: Diagnosis not present

## 2021-02-12 DIAGNOSIS — L570 Actinic keratosis: Secondary | ICD-10-CM | POA: Diagnosis not present

## 2021-03-17 DIAGNOSIS — H6123 Impacted cerumen, bilateral: Secondary | ICD-10-CM | POA: Diagnosis not present

## 2021-04-20 ENCOUNTER — Other Ambulatory Visit: Payer: Self-pay

## 2021-04-20 MED ORDER — METOPROLOL TARTRATE 25 MG PO TABS: ORAL_TABLET | ORAL | 3 refills | Status: DC

## 2021-05-14 DIAGNOSIS — L821 Other seborrheic keratosis: Secondary | ICD-10-CM | POA: Diagnosis not present

## 2021-05-14 DIAGNOSIS — Z85828 Personal history of other malignant neoplasm of skin: Secondary | ICD-10-CM | POA: Diagnosis not present

## 2021-05-14 DIAGNOSIS — L812 Freckles: Secondary | ICD-10-CM | POA: Diagnosis not present

## 2021-05-14 DIAGNOSIS — D1801 Hemangioma of skin and subcutaneous tissue: Secondary | ICD-10-CM | POA: Diagnosis not present

## 2021-05-20 DIAGNOSIS — N189 Chronic kidney disease, unspecified: Secondary | ICD-10-CM | POA: Diagnosis not present

## 2021-05-20 DIAGNOSIS — N057 Unspecified nephritic syndrome with diffuse crescentic glomerulonephritis: Secondary | ICD-10-CM | POA: Diagnosis not present

## 2021-05-20 DIAGNOSIS — N058 Unspecified nephritic syndrome with other morphologic changes: Secondary | ICD-10-CM | POA: Diagnosis not present

## 2021-05-20 DIAGNOSIS — N1831 Chronic kidney disease, stage 3a: Secondary | ICD-10-CM | POA: Diagnosis not present

## 2021-05-20 DIAGNOSIS — D631 Anemia in chronic kidney disease: Secondary | ICD-10-CM | POA: Diagnosis not present

## 2021-05-20 DIAGNOSIS — I129 Hypertensive chronic kidney disease with stage 1 through stage 4 chronic kidney disease, or unspecified chronic kidney disease: Secondary | ICD-10-CM | POA: Diagnosis not present

## 2021-05-20 DIAGNOSIS — E559 Vitamin D deficiency, unspecified: Secondary | ICD-10-CM | POA: Diagnosis not present

## 2021-05-26 ENCOUNTER — Other Ambulatory Visit: Payer: Self-pay

## 2021-05-26 ENCOUNTER — Ambulatory Visit (HOSPITAL_COMMUNITY)
Admission: RE | Admit: 2021-05-26 | Discharge: 2021-05-26 | Disposition: A | Discharge status: Home / Self Care | Payer: Medicare Other | Source: Ambulatory Visit | Attending: Cardiology | Admitting: Cardiology

## 2021-05-26 ENCOUNTER — Other Ambulatory Visit (HOSPITAL_COMMUNITY): Payer: Self-pay | Admitting: Cardiovascular Disease

## 2021-05-26 DIAGNOSIS — I6523 Occlusion and stenosis of bilateral carotid arteries: Secondary | ICD-10-CM | POA: Diagnosis not present

## 2021-05-26 DIAGNOSIS — Z9889 Other specified postprocedural states: Secondary | ICD-10-CM

## 2021-06-10 ENCOUNTER — Other Ambulatory Visit: Payer: Self-pay

## 2021-06-10 DIAGNOSIS — I739 Peripheral vascular disease, unspecified: Secondary | ICD-10-CM

## 2021-06-10 DIAGNOSIS — Z6825 Body mass index (BMI) 25.0-25.9, adult: Secondary | ICD-10-CM | POA: Diagnosis not present

## 2021-06-10 DIAGNOSIS — C4A61 Merkel cell carcinoma of right upper limb, including shoulder: Secondary | ICD-10-CM | POA: Diagnosis not present

## 2021-06-10 DIAGNOSIS — I10 Essential (primary) hypertension: Secondary | ICD-10-CM

## 2021-06-10 MED ORDER — EZETIMIBE 10 MG PO TABS: 10.0000 mg | ORAL_TABLET | Freq: Every day | ORAL | 1 refills | Status: DC

## 2021-06-11 ENCOUNTER — Other Ambulatory Visit: Payer: Self-pay

## 2021-06-11 DIAGNOSIS — I739 Peripheral vascular disease, unspecified: Secondary | ICD-10-CM

## 2021-06-11 DIAGNOSIS — I10 Essential (primary) hypertension: Secondary | ICD-10-CM

## 2021-06-11 MED ORDER — EZETIMIBE 10 MG PO TABS: 10.0000 mg | ORAL_TABLET | Freq: Every day | ORAL | 2 refills | Status: AC

## 2021-07-03 ENCOUNTER — Telehealth: Payer: Self-pay | Admitting: Cardiovascular Disease

## 2021-07-03 ENCOUNTER — Other Ambulatory Visit: Payer: Self-pay

## 2021-07-03 MED ORDER — SIMVASTATIN 80 MG PO TABS: ORAL_TABLET | ORAL | 3 refills | Status: AC

## 2021-07-03 NOTE — Telephone Encounter (Signed)
°*  STAT* If patient is at the pharmacy, call can be transferred to refill team.   1. Which medications need to be refilled? (please list name of each medication and dose if known)   simvastatin (ZOCOR) 80 MG tablet  ezetimibe (ZETIA) 10 MG tablet  2. Which pharmacy/location (including street and city if local pharmacy) is medication to be sent to?[id number] - [place], [place]  Phone:  [phone] Fax:  [phone]   3. Do they need a 30 day or 90 day supply? 90 ds

## 2021-07-14 ENCOUNTER — Ambulatory Visit (INDEPENDENT_AMBULATORY_CARE_PROVIDER_SITE_OTHER): Payer: Medicare Other | Admitting: Cardiovascular Disease

## 2021-07-14 ENCOUNTER — Encounter: Payer: Self-pay | Admitting: Cardiovascular Disease

## 2021-07-14 ENCOUNTER — Other Ambulatory Visit: Payer: Self-pay

## 2021-07-14 VITALS — BP 105/58 | HR 68 | Ht 69.0 in | Wt 172.6 lb

## 2021-07-14 DIAGNOSIS — I6521 Occlusion and stenosis of right carotid artery: Secondary | ICD-10-CM

## 2021-07-14 DIAGNOSIS — I2510 Atherosclerotic heart disease of native coronary artery without angina pectoris: Secondary | ICD-10-CM

## 2021-07-14 DIAGNOSIS — I739 Peripheral vascular disease, unspecified: Secondary | ICD-10-CM | POA: Diagnosis not present

## 2021-07-14 DIAGNOSIS — I10 Essential (primary) hypertension: Secondary | ICD-10-CM

## 2021-07-14 DIAGNOSIS — E7800 Pure hypercholesterolemia, unspecified: Secondary | ICD-10-CM | POA: Diagnosis not present

## 2021-07-14 DIAGNOSIS — I2583 Coronary atherosclerosis due to lipid rich plaque: Secondary | ICD-10-CM | POA: Diagnosis not present

## 2021-07-14 MED ORDER — CLOPIDOGREL BISULFATE 75 MG PO TABS: 75.0000 mg | ORAL_TABLET | Freq: Every day | ORAL | 4 refills | Status: AC

## 2021-07-14 MED ORDER — METOPROLOL TARTRATE 25 MG PO TABS: ORAL_TABLET | ORAL | 4 refills | Status: DC

## 2021-07-14 NOTE — Assessment & Plan Note (Signed)
History of carotid artery disease status post remote right carotid endarterectomy by Dr. Gae Gallop.  His most recent carotid Dopplers performed 05/26/2021 revealed his carotid to be widely patent.  We will continue to follow this on an annual basis.

## 2021-07-14 NOTE — Assessment & Plan Note (Signed)
History of essential hypertension blood pressure measured today at 105/58.  He is on metoprolol.

## 2021-07-14 NOTE — Assessment & Plan Note (Signed)
History of aortoiliac disease although he denies claudication.  He did have aortoiliac Dopplers performed 12/13, 14/21 revealing a right ABI of 0.92 and a left of 0.72.  He had a high-frequency signal in his left common iliac artery and distal left SFA.  I did do an abdominal aortogram at the time of his cardiac catheterization 07/10/2020 that did not show show any aortoiliac occlusive disease.  We will continue to follow this by duplex ultrasound.

## 2021-07-14 NOTE — Progress Notes (Signed)
07/14/2021 [name]   December 26, 1940  [id number]  Primary Physician [name], MD Primary Cardiologist: [name] MD FACP, [place], [place], [place]  HPI:  [name] is a 81 y.o.   thin and fit-appearing married Caucasian male with no children, who I las saw in the office 01/07/2021.[name] He has a history of CAD and PVOD. He had coronary artery bypass grafting in 2006 with a LIMA to his LAD, a vein to the RCA, circumflex, and diagonal branches. He did develop paroxysmal atrial fibrillation postoperatively. He has PVOD, status post right carotid endarterectomy performed by Dr. [name], as well as a known aortoiliac and left SFA disease, but he denies claudication except after walking seven or eight blocks in his left calf. His other problems include treated hypertension and dyslipidemia. He had a catheterization July 13, 2010, and found to have patent grafts with moderate aortoiliac disease. He does have moderate renal insufficiency followed by Dr. [name]. He has had laparoscopic cholecystectomy that was uncomplicated in the past.. He did have a negative Myoview stress test 8/14.[name] He does exercise on treadmill for 25 minutes a day with minimal claudication     He did have a left renal artery stent placed by Dr. [name] 2/19 at the request of Dr. [name] .  He was hospitalized 05/01/2018 for 1 day because of dizziness and was found to have some symptomatic bradycardia.  Beta-blocker was discontinued and subsequent event monitor showed no arrhythmias.   He had a Myoview stress test performed 07/19/2018 that showed ischemia in the the diagonal branch territory.  This led to the outpatient cath performed 07/24/2018 revealing a 99% stenosis in the body of the vein graft to diagonal branch which was stented with 2 overlapping synergy drug-eluting stents.  His RCA vein graft is occluded but his RCA was widely patent.  His LIMA was patent and the vein to the obtuse marginal  branch had a 70% smooth stenosis which did not appear to be physiologically significant.  His symptoms have significantly resolved though he still has some epigastric fullness.   He had noticed  some exertional chest tightness while on the treadmill as well as some fatigue.  It should be noted that his symptoms resolved back in February after I stented his diagonal branch vein graft.  I have prescribed Imdur and was planning on seeing back in follow-up however he failed to fill the prescription because of fear of dizziness.  He continues to have exertional chest pressure while walking on the treadmill.   Because of ongoing symptoms I elected to proceed with outpatient diagnostic coronary angiography 05/28/2019 revealing occluded diagonal branch vein graft with otherwise unchanged anatomy.  I elected to treat him medically at that time.  He has rare angina.  He does also complain of rare left calf claudication with a slightly low lower left ABI than the right.   Since I saw him 6 months ago he has developed some progressive effort angina.  I am concerned this may represent progression of his circumflex obtuse marginal branch vein graft disease.  He also has some left calf claudication with progression of disease in his left common iliac artery as well as left SFA.  He does have moderate renal insufficiency.     I performed outpatient diagnostic coronary angiography 07/10/2020 revealing unchanged anatomy compared to his prior cath.  I also did an abdominal aortogram and bilateral iliac angiogram.  He had a small distal abdominal aorta but  no significant iliac disease.  Since I saw him 6 months ago he is remained stable.  He does have mild stable effort angina.  He denies claudication.   Current Meds  Medication Sig   acyclovir (ZOVIRAX) 400 MG tablet Take 1 tablet (400 mg total) by mouth as needed (cold sores/fever blisters.).   aspirin EC 81 MG tablet Take 81 mg by mouth daily at 12 noon.    Choline  Fenofibrate (FENOFIBRIC ACID) 45 MG CPDR TAKE ONE CAPSULE BY MOUTH EVERY NIGHT AT BEDTIME   dimenhyDRINATE (DRAMAMINE) 50 MG tablet Take 50 mg by mouth every 8 (eight) hours as needed (vertigo).   docusate sodium (COLACE) 100 MG capsule Take 100 mg by mouth 2 (two) times daily as needed (constipation.).   ezetimibe (ZETIA) 10 MG tablet Take 1 tablet (10 mg total) by mouth daily.   furosemide (LASIX) 20 MG tablet Take 10 mg by mouth daily.   isosorbide mononitrate (IMDUR) 60 MG 24 hr tablet Take 1 tablet (60 mg total) by mouth daily.   meclizine (ANTIVERT) 25 MG tablet Take 1 tablet (25 mg total) by mouth 3 (three) times daily as needed for dizziness. (Patient taking differently: Take 12.5 mg by mouth 3 (three) times daily as needed for dizziness.)   nitroGLYCERIN (NITROSTAT) 0.4 MG SL tablet DISSOLVE 1 TAB UNDER TONGUE FOR CHEST PAIN - IF PAIN REMAINS AFTER 5 MIN, CALL 911 AND REPEAT DOSE. MAX 3 TABS IN 15 MINUTES   pantoprazole (PROTONIX) 40 MG tablet TAKE ONE TABLET BY MOUTH DAILY   simvastatin (ZOCOR) 80 MG tablet TAKE ONE TABLET BY MOUTH DAILY AT 6 PM   tetrahydrozoline 0.05 % ophthalmic solution Place 1-2 drops into both eyes 3 (three) times daily as needed (dry/irritated eyes).   [DISCONTINUED] clopidogrel (PLAVIX) 75 MG tablet Take 1 tablet (75 mg total) by mouth daily with breakfast.   [DISCONTINUED] metoprolol tartrate (LOPRESSOR) 25 MG tablet TAKE 1/2 TABLET BY MOUTH TWO TIMES DAILY     Allergies  Allergen Reactions   Adhesive [Tape] Rash   Latex Rash    Social History   Socioeconomic History   Marital status: Married    Spouse name: [name]   Number of children: 0   Years of education: Not on file   Highest education level: Not on file  Occupational History   Occupation: Retired    Comment: Theme park manager: RETIRED  Tobacco Use   Smoking status: Never   Smokeless tobacco: Never  Vaping Use   Vaping Use: Never used  Substance and Sexual Activity   Alcohol use: Yes     Alcohol/week: 0.0 standard drinks    Comment: rarely   Drug use: No   Sexual activity: Yes  Other Topics Concern   Not on file  Social History Narrative   Last name is pronounced "cas ka" No caffeine use. Always uses seat belts. Exercise:Moderate walks 30 min daily and rides a bike. Uses treadmill 20 mins everyday. Smoke alarm in home, No guns in the home.      Marital status:  Married x 12 years: happily married. Most of family in [place].      Children; none      Lives: with wife      Employment: retired.      Tobacco; never       Alcohol: wine after lunch daily.      Exercise:  Treadmill 20 minutes per day seven days per week.  Also walking throughout the day.  Advanced Directives: YES: FULL CODE no prolonged measures.      ADLs:  Independent with ADLs; no assistant devices.  Drives.    Social Determinants of Health   Financial Resource Strain: Not on file  Food Insecurity: Not on file  Transportation Needs: Not on file  Physical Activity: Not on file  Stress: Not on file  Social Connections: Not on file  Intimate Partner Violence: Not on file     Review of Systems: General: negative for chills, fever, night sweats or weight changes.  Cardiovascular: negative for chest pain, dyspnea on exertion, edema, orthopnea, palpitations, paroxysmal nocturnal dyspnea or shortness of breath Dermatological: negative for rash Respiratory: negative for cough or wheezing Urologic: negative for hematuria Abdominal: negative for nausea, vomiting, diarrhea, bright red blood per rectum, melena, or hematemesis Neurologic: negative for visual changes, syncope, or dizziness All other systems reviewed and are otherwise negative except as noted above.    Blood pressure (!) 105/58, pulse 68, height 5\' 9"  (1.753 m), weight 172 lb 9.6 oz (78.3 kg), SpO2 98 %.  General appearance: alert and no distress Neck: no adenopathy, no carotid bruit, no JVD, supple, symmetrical, trachea midline, and  thyroid not enlarged, symmetric, no tenderness/mass/nodules Lungs: clear to auscultation bilaterally Heart: regular rate and rhythm, S1, S2 normal, no murmur, click, rub or gallop Extremities: extremities normal, atraumatic, no cyanosis or edema Pulses: 2+ and symmetric Skin: Skin color, texture, turgor normal. No rashes or lesions Neurologic: Grossly normal  EKG sinus rhythm at 68 with incomplete right bundle branch block.  I personally reviewed this EKG.  ASSESSMENT AND PLAN:   Essential hypertension, benign History of essential hypertension blood pressure measured today at 105/58.  He is on metoprolol.  Pure hypercholesterolemia History of hyperlipidemia on Zetia and simvastatin with lipid profile performed 03/25/2020 revealing a total cholesterol of 143, LDL of 81 and HDL 38.  We will recheck a lipid liver profile.  Coronary artery disease due to lipid rich plaque History of CAD status post coronary artery bypass grafting in 2006 with a LIMA to the LAD, vein to the RCA, circumflex and diagonal branches.  His last catheterization which I performed 07/10/2020 revealed unchanged anatomy compared to his prior cath.  He did have an occluded vein to a diagonal branch and to the RCA.  He currently gets stable infrequent exertional chest pain.  Claudication in peripheral vascular disease ([place]) History of aortoiliac disease although he denies claudication.  He did have aortoiliac Dopplers performed 12/13, 14/21 revealing a right ABI of 0.92 and a left of 0.72.  He had a high-frequency signal in his left common iliac artery and distal left SFA.  I did do an abdominal aortogram at the time of his cardiac catheterization 07/10/2020 that did not show show any aortoiliac occlusive disease.  We will continue to follow this by duplex ultrasound.  Carotid artery disease ([place]) History of carotid artery disease status post remote right carotid endarterectomy by Dr. Gae Gallop.  His most recent carotid  Dopplers performed 05/26/2021 revealed his carotid to be widely patent.  We will continue to follow this on an annual basis.     [name] MD FACP,FACC,FAHA, [facility] 07/14/2021 12:09 PM

## 2021-07-14 NOTE — Assessment & Plan Note (Signed)
History of hyperlipidemia on Zetia and simvastatin with lipid profile performed 03/25/2020 revealing a total cholesterol of 143, LDL of 81 and HDL 38.  We will recheck a lipid liver profile.

## 2021-07-14 NOTE — Patient Instructions (Signed)
Medication Instructions:  Your physician recommends that you continue on your current medications as directed. Please refer to the Current Medication list given to you today.  *If you need a refill on your cardiac medications before your next appointment, please call your pharmacy*   Lab Work: Your physician recommends that you have labs drawn today: lipid/liver profile  If you have labs (blood work) drawn today and your tests are completely normal, you will receive your results only by: [place] (if you have MyChart) OR A paper copy in the mail If you have any lab test that is abnormal or we need to change your treatment, we will call you to review the results.   Testing/Procedures: Your physician has requested that you have a carotid duplex. This test is an ultrasound of the carotid arteries in your neck. It looks at blood flow through these arteries that supply the brain with blood. Allow one hour for this exam. There are no restrictions or special instructions.  Dr. Gwenlyn Found has recommended that you have an Ultrasound of your AORTA/IVC/ILIACS.   To prepare for this test:  No food after 11PM the night before. Water is OK. (Don't drink liquids if you have been instructed not to for ANOTHER test). Avoid foods that produce bowel gas, for 24 hours prior to exam (see below). No breakfast, no chewing gum, no smoking or carbonated beverages. Patient may take morning medications with water. Come in for test at least 15 minutes early to register.   Your physician has requested that you have an ankle brachial index (ABI). During this test an ultrasound and blood pressure cuff are used to evaluate the arteries that supply the arms and legs with blood. Allow thirty minutes for this exam. There are no restrictions or special instructions. To be done in Dec. These procedures are done at [facility].    Follow-Up: At [facility], you and your health needs are our priority.  As part  of our continuing mission to provide you with exceptional heart care, we have created designated Provider Care Teams.  These Care Teams include your primary Cardiologist (physician) and Advanced Practice Providers (APPs -  Physician Assistants and Nurse Practitioners) who all work together to provide you with the care you need, when you need it.  We recommend signing up for the patient portal called "MyChart".  Sign up information is provided on this After Visit Summary.  MyChart is used to connect with patients for Virtual Visits (Telemedicine).  Patients are able to view lab/test results, encounter notes, upcoming appointments, etc.  Non-urgent messages can be sent to your provider as well.   To learn more about what you can do with MyChart, go to [email or web address].    Your next appointment:   12 month(s)  The format for your next appointment:   In Person  Provider:   Quay Burow, MD

## 2021-07-14 NOTE — Assessment & Plan Note (Signed)
History of CAD status post coronary artery bypass grafting in 2006 with a LIMA to the LAD, vein to the RCA, circumflex and diagonal branches.  His last catheterization which I performed 07/10/2020 revealed unchanged anatomy compared to his prior cath.  He did have an occluded vein to a diagonal branch and to the RCA.  He currently gets stable infrequent exertional chest pain.

## 2021-07-15 LAB — HEPATIC FUNCTION PANEL
ALT: 23 IU/L (ref 0–44)
AST: 32 IU/L (ref 0–40)
Albumin: 4.7 g/dL (ref 3.7–4.7)
Alkaline Phosphatase: 77 IU/L (ref 44–121)
Bilirubin Total: 0.3 mg/dL (ref 0.0–1.2)
Bilirubin, Direct: 0.15 mg/dL (ref 0.00–0.40)
Total Protein: 6.3 g/dL (ref 6.0–8.5)

## 2021-07-15 LAB — LIPID PANEL
Chol/HDL Ratio: 3.6 ratio (ref 0.0–5.0)
Cholesterol, Total: 133 mg/dL (ref 100–199)
HDL: 37 mg/dL — ABNORMAL LOW (ref >39)
LDL Chol Calc (NIH): 67 mg/dL (ref 0–99)
Triglycerides: 169 mg/dL — ABNORMAL HIGH (ref 0–149)
VLDL Cholesterol Cal: 29 mg/dL (ref 5–40)

## 2021-07-25 ENCOUNTER — Other Ambulatory Visit: Payer: Self-pay | Admitting: Cardiovascular Disease

## 2021-08-12 DIAGNOSIS — L820 Inflamed seborrheic keratosis: Secondary | ICD-10-CM | POA: Diagnosis not present

## 2021-08-12 DIAGNOSIS — D1801 Hemangioma of skin and subcutaneous tissue: Secondary | ICD-10-CM | POA: Diagnosis not present

## 2021-08-12 DIAGNOSIS — L821 Other seborrheic keratosis: Secondary | ICD-10-CM | POA: Diagnosis not present

## 2021-08-12 DIAGNOSIS — L812 Freckles: Secondary | ICD-10-CM | POA: Diagnosis not present

## 2021-08-12 DIAGNOSIS — Z85828 Personal history of other malignant neoplasm of skin: Secondary | ICD-10-CM | POA: Diagnosis not present

## 2021-08-19 ENCOUNTER — Other Ambulatory Visit: Payer: Self-pay

## 2021-08-19 MED ORDER — FENOFIBRIC ACID 45 MG PO CPDR: 1.0000 | DELAYED_RELEASE_CAPSULE | Freq: Every day | ORAL | 3 refills | Status: AC

## 2021-08-28 DIAGNOSIS — B999 Unspecified infectious disease: Secondary | ICD-10-CM | POA: Diagnosis not present

## 2021-08-28 DIAGNOSIS — R051 Acute cough: Secondary | ICD-10-CM | POA: Diagnosis not present

## 2021-08-28 DIAGNOSIS — R509 Fever, unspecified: Secondary | ICD-10-CM | POA: Diagnosis not present

## 2021-08-28 DIAGNOSIS — U071 COVID-19: Secondary | ICD-10-CM | POA: Diagnosis not present

## 2021-08-31 DIAGNOSIS — U071 COVID-19: Secondary | ICD-10-CM | POA: Diagnosis not present

## 2021-08-31 DIAGNOSIS — Z9189 Other specified personal risk factors, not elsewhere classified: Secondary | ICD-10-CM | POA: Diagnosis not present

## 2021-09-06 ENCOUNTER — Emergency Department (HOSPITAL_COMMUNITY): Payer: Medicare Other

## 2021-09-06 ENCOUNTER — Encounter (HOSPITAL_COMMUNITY): Payer: Self-pay | Admitting: Emergency Medicine

## 2021-09-06 ENCOUNTER — Emergency Department (HOSPITAL_COMMUNITY)
Admission: EM | Admit: 2021-09-06 | Discharge: 2021-09-06 | Disposition: A | Discharge status: Home / Self Care | Payer: Medicare Other | Attending: Emergency Medicine | Admitting: Emergency Medicine

## 2021-09-06 ENCOUNTER — Other Ambulatory Visit: Payer: Self-pay

## 2021-09-06 DIAGNOSIS — Z7982 Long term (current) use of aspirin: Secondary | ICD-10-CM | POA: Insufficient documentation

## 2021-09-06 DIAGNOSIS — R531 Weakness: Secondary | ICD-10-CM | POA: Diagnosis not present

## 2021-09-06 DIAGNOSIS — U071 COVID-19: Secondary | ICD-10-CM | POA: Diagnosis not present

## 2021-09-06 DIAGNOSIS — N183 Chronic kidney disease, stage 3 unspecified: Secondary | ICD-10-CM | POA: Diagnosis not present

## 2021-09-06 DIAGNOSIS — J069 Acute upper respiratory infection, unspecified: Secondary | ICD-10-CM | POA: Insufficient documentation

## 2021-09-06 DIAGNOSIS — R000 Tachycardia, unspecified: Secondary | ICD-10-CM | POA: Diagnosis not present

## 2021-09-06 DIAGNOSIS — I129 Hypertensive chronic kidney disease with stage 1 through stage 4 chronic kidney disease, or unspecified chronic kidney disease: Secondary | ICD-10-CM | POA: Insufficient documentation

## 2021-09-06 DIAGNOSIS — B9789 Other viral agents as the cause of diseases classified elsewhere: Secondary | ICD-10-CM | POA: Diagnosis not present

## 2021-09-06 DIAGNOSIS — Z91040 Latex allergy status: Secondary | ICD-10-CM | POA: Insufficient documentation

## 2021-09-06 DIAGNOSIS — Z79899 Other long term (current) drug therapy: Secondary | ICD-10-CM | POA: Insufficient documentation

## 2021-09-06 DIAGNOSIS — R059 Cough, unspecified: Secondary | ICD-10-CM | POA: Diagnosis not present

## 2021-09-06 LAB — COMPREHENSIVE METABOLIC PANEL
ALT: 30 U/L (ref 0–44)
AST: 38 U/L (ref 15–41)
Albumin: 3.7 g/dL (ref 3.5–5.0)
Alkaline Phosphatase: 50 U/L (ref 38–126)
Anion gap: 10 (ref 5–15)
BUN: 30 mg/dL — ABNORMAL HIGH (ref 8–23)
CO2: 26 mmol/L (ref 22–32)
Calcium: 8.4 mg/dL — ABNORMAL LOW (ref 8.9–10.3)
Chloride: 93 mmol/L — ABNORMAL LOW (ref 98–111)
Creatinine, Ser: 1.61 mg/dL — ABNORMAL HIGH (ref 0.61–1.24)
GFR, Estimated: 43 mL/min — ABNORMAL LOW (ref >60)
Glucose, Bld: 113 mg/dL — ABNORMAL HIGH (ref 70–99)
Potassium: 4.5 mmol/L (ref 3.5–5.1)
Sodium: 129 mmol/L — ABNORMAL LOW (ref 135–145)
Total Bilirubin: 0.3 mg/dL (ref 0.3–1.2)
Total Protein: 6.4 g/dL — ABNORMAL LOW (ref 6.5–8.1)

## 2021-09-06 LAB — CBC WITH DIFFERENTIAL/PLATELET
Abs Immature Granulocytes: 0.03 10*3/uL (ref 0.00–0.07)
Basophils Absolute: 0 10*3/uL (ref 0.0–0.1)
Basophils Relative: 0 %
Eosinophils Absolute: 0 10*3/uL (ref 0.0–0.5)
Eosinophils Relative: 0 %
HCT: 48.4 % (ref 39.0–52.0)
Hemoglobin: 16.4 g/dL (ref 13.0–17.0)
Immature Granulocytes: 1 %
Lymphocytes Relative: 9 %
Lymphs Abs: 0.6 10*3/uL — ABNORMAL LOW (ref 0.7–4.0)
MCH: 28.9 pg (ref 26.0–34.0)
MCHC: 33.9 g/dL (ref 30.0–36.0)
MCV: 85.2 fL (ref 80.0–100.0)
Monocytes Absolute: 0.4 10*3/uL (ref 0.1–1.0)
Monocytes Relative: 7 %
Neutro Abs: 5.5 10*3/uL (ref 1.7–7.7)
Neutrophils Relative %: 83 %
Platelets: 174 10*3/uL (ref 150–400)
RBC: 5.68 MIL/uL (ref 4.22–5.81)
RDW: 12.7 % (ref 11.5–15.5)
WBC: 6.6 10*3/uL (ref 4.0–10.5)
nRBC: 0 % (ref 0.0–0.2)

## 2021-09-06 LAB — RESP PANEL BY RT-PCR (FLU A&B, COVID) ARPGX2
Influenza A by PCR: NEGATIVE
Influenza B by PCR: NEGATIVE
SARS Coronavirus 2 by RT PCR: POSITIVE — AB

## 2021-09-06 LAB — LACTIC ACID, PLASMA: Lactic Acid, Venous: 1.4 mmol/L (ref 0.5–1.9)

## 2021-09-06 MED ORDER — BENZONATATE 100 MG PO CAPS
200.0000 mg | ORAL_CAPSULE | Freq: Once | ORAL | Status: AC
  Administered 2021-09-06: 200 mg via ORAL
  Filled 2021-09-06: qty 2

## 2021-09-06 MED ORDER — ACETAMINOPHEN 325 MG PO TABS: 650.0000 mg | ORAL_TABLET | Freq: Once | ORAL | Status: AC | PRN

## 2021-09-06 MED ORDER — BENZONATATE 100 MG PO CAPS: 100.0000 mg | ORAL_CAPSULE | Freq: Three times a day (TID) | ORAL | 0 refills | Status: AC

## 2021-09-06 MED ORDER — ACETAMINOPHEN 325 MG PO TABS
ORAL_TABLET | ORAL | Status: AC
  Administered 2021-09-06: 650 mg via ORAL
  Filled 2021-09-06: qty 2

## 2021-09-06 NOTE — ED Provider Triage Note (Signed)
Emergency Medicine Provider Triage Evaluation Note ? ?[name] , a 81 y.o. male  was evaluated in triage.  Pt complains of weakness from covid. Dx 3/17. ? ?Review of Systems  ?Positive: weakness ?Negative: Chest pain, sob ? ?Physical Exam  ?BP (!) 142/57 (BP Location: Right Arm)   Pulse (!) 110   Temp (!) 101 ?F (38.3 ?C) (Oral)   SpO2 92%  ?Gen:   Awake, no distress   ?Resp:  Normal effort  ?MSK:   Moves extremities without difficulty  ?Other:  tachycardic ? ?Medical Decision Making  ?Medically screening exam initiated at 3:28 PM.  Appropriate orders placed.  [name] was informed that the remainder of the evaluation will be completed by another provider, this initial triage assessment does not replace that evaluation, and the importance of remaining in the ED until their evaluation is complete. ? ? ?  ?Rodney Booze, PA-C ?09/06/21 1529 ? ?

## 2021-09-06 NOTE — Discharge Instructions (Signed)
Overall recommend follow-up with your primary care doctor if you are still having fever later this week.  Recommend tea and honey for your cough.  I have written you for cough medicine as well.  Your chest x-ray was normal today.  Suspect that your intermittent weakness is secondary to fever.  Make sure you continue to take Tylenol 1000 mg every 6 hours as needed for fever.  Return if symptoms worsen. ?

## 2021-09-06 NOTE — ED Provider Notes (Signed)
?[name] DEPT ?Provider Note ? ? ?CSN: [id number] ?Arrival date & time: 09/06/21  1515 ? ?  ? ?History ? ?Chief Complaint  ?Patient presents with  ? Cough  ? ? ?[name] is a 81 y.o. male. ? ?Patient here with continued cough and fever.  Diagnosed with COVID about 8 or 9 days ago.  States he took some medication and steroids.  Not sure if he took antiviral.  He is just has some weakness but mostly concerned about continued cough.  Denies any shortness of breath or chest pain.  Denies any diarrhea, nausea, vomiting.  Has been taking Tylenol for fever as needed. ? ?The history is provided by the patient.  ?Cough ?Severity:  Mild ?Timing:  Intermittent ?Progression:  Waxing and waning ?Chronicity:  New ?Smoker: yes   ?Context: upper respiratory infection   ?Relieved by:  Nothing ?Worsened by:  Nothing ?Associated symptoms: fever   ?Associated symptoms: no chest pain, no chills, no diaphoresis, no ear fullness, no ear pain, no eye discharge, no headaches, no myalgias, no rash, no rhinorrhea, no shortness of breath, no sinus congestion, no sore throat, no weight loss and no wheezing   ? ?  ? ?Home Medications ?Prior to Admission medications   ?Medication Sig Start Date End Date Taking? Authorizing Provider  ?benzonatate (TESSALON) 100 MG capsule Take 1 capsule (100 mg total) by mouth every 8 (eight) hours for 20 doses. 09/06/21 09/13/21 Yes Rahkeem Senft, DO  ?acyclovir (ZOVIRAX) 400 MG tablet Take 1 tablet (400 mg total) by mouth as needed (cold sores/fever blisters.). 12/12/20   [name], MD  ?aspirin EC 81 MG tablet Take 81 mg by mouth daily at 12 noon.     [provider]  ?Choline Fenofibrate (FENOFIBRIC ACID) 45 MG CPDR Take 1 capsule by mouth at bedtime. 08/19/21   [name], MD  ?clopidogrel (PLAVIX) 75 MG tablet Take 1 tablet (75 mg total) by mouth daily with breakfast. 07/14/21   [name], MD  ?dimenhyDRINATE (DRAMAMINE) 50 MG tablet Take  50 mg by mouth every 8 (eight) hours as needed (vertigo).    [provider]  ?docusate sodium (COLACE) 100 MG capsule Take 100 mg by mouth 2 (two) times daily as needed (constipation.).    [provider]  ?ezetimibe (ZETIA) 10 MG tablet Take 1 tablet (10 mg total) by mouth daily. 06/11/21   [name], MD  ?furosemide (LASIX) 20 MG tablet Take 10 mg by mouth daily.    [provider]  ?isosorbide mononitrate (IMDUR) 60 MG 24 hr tablet TAKE ONE TABLET BY MOUTH DAILY 07/27/21   [name], MD  ?meclizine (ANTIVERT) 25 MG tablet Take 1 tablet (25 mg total) by mouth 3 (three) times daily as needed for dizziness. ?Patient taking differently: Take 12.5 mg by mouth 3 (three) times daily as needed for dizziness. 12/17/18   Lacretia Leigh, MD  ?metoprolol tartrate (LOPRESSOR) 25 MG tablet TAKE 1/2 TABLET BY MOUTH TWO TIMES DAILY 07/14/21   [name], MD  ?nitroGLYCERIN (NITROSTAT) 0.4 MG SL tablet DISSOLVE 1 TAB UNDER TONGUE FOR CHEST PAIN - IF PAIN REMAINS AFTER 5 MIN, CALL 911 AND REPEAT DOSE. MAX 3 TABS IN 15 MINUTES 12/17/20   [name], MD  ?pantoprazole (PROTONIX) 40 MG tablet TAKE ONE TABLET BY MOUTH DAILY 12/18/20   Deberah Pelton, NP  ?simvastatin (ZOCOR) 80 MG tablet TAKE ONE TABLET BY MOUTH DAILY AT 6 PM 07/03/21  [name], MD  ?tetrahydrozoline 0.05 % ophthalmic solution Place 1-2 drops into both eyes 3 (three) times daily as needed (dry/irritated eyes).    [provider]  ?   ? ?Allergies    ?Adhesive [tape] and Latex   ? ?Review of Systems   ?Review of Systems  ?Constitutional:  Positive for fever. Negative for chills, diaphoresis and weight loss.  ?HENT:  Negative for ear pain, rhinorrhea and sore throat.   ?Eyes:  Negative for discharge.  ?Respiratory:  Positive for cough. Negative for shortness of breath and wheezing.   ?Cardiovascular:  Negative for chest pain.  ?Musculoskeletal:  Negative for myalgias.  ?Skin:  Negative for rash.   ?Neurological:  Negative for headaches.  ? ?Physical Exam ?Updated Vital Signs ?BP 113/65   Pulse 100   Temp 98.2 ?F (36.8 ?C) (Oral)   Resp 18   Ht '5\' 9"'$  (1.753 m)   Wt 78.3 kg   SpO2 98%   BMI 25.49 kg/m?  ?Physical Exam ?Vitals and nursing note reviewed.  ?Constitutional:   ?   General: He is not in acute distress. ?   Appearance: He is well-developed. He is not ill-appearing.  ?HENT:  ?   Head: Normocephalic and atraumatic.  ?   Nose: Nose normal.  ?   Mouth/Throat:  ?   Mouth: Mucous membranes are moist.  ?Eyes:  ?   Extraocular Movements: Extraocular movements intact.  ?   Conjunctiva/sclera: Conjunctivae normal.  ?   Pupils: Pupils are equal, round, and reactive to light.  ?Cardiovascular:  ?   Rate and Rhythm: Normal rate and regular rhythm.  ?   Pulses: Normal pulses.  ?   Heart sounds: Normal heart sounds. No murmur heard. ?Pulmonary:  ?   Effort: Pulmonary effort is normal. No respiratory distress.  ?   Breath sounds: Normal breath sounds.  ?Abdominal:  ?   Palpations: Abdomen is soft.  ?   Tenderness: There is no abdominal tenderness.  ?Musculoskeletal:     ?   General: No swelling.  ?   Cervical back: Normal range of motion and neck supple.  ?Skin: ?   General: Skin is warm and dry.  ?   Capillary Refill: Capillary refill takes less than 2 seconds.  ?Neurological:  ?   General: No focal deficit present.  ?   Mental Status: He is alert.  ?   Cranial Nerves: No cranial nerve deficit.  ?   Sensory: No sensory deficit.  ?   Motor: No weakness.  ?   Gait: Gait normal.  ?   Comments: 5+ out of 5 strength throughout, normal sensation, no drift, normal finger-nose-finger, normal speech  ?Psychiatric:     ?   Mood and Affect: Mood normal.  ? ? ?ED Results / Procedures / Treatments   ?Labs ?(all labs ordered are listed, but only abnormal results are displayed) ?Labs Reviewed  ?RESP PANEL BY RT-PCR (FLU A&B, COVID) ARPGX2 - Abnormal; Notable for the following components:  ?    Result Value  ? SARS  Coronavirus 2 by RT PCR POSITIVE (*)   ? All other components within normal limits  ?COMPREHENSIVE METABOLIC PANEL - Abnormal; Notable for the following components:  ? Sodium 129 (*)   ? Chloride 93 (*)   ? Glucose, Bld 113 (*)   ? BUN 30 (*)   ? Creatinine, Ser 1.61 (*)   ? Calcium 8.4 (*)   ? Total Protein 6.4 (*)   ?  GFR, Estimated 43 (*)   ? All other components within normal limits  ?CBC WITH DIFFERENTIAL/PLATELET - Abnormal; Notable for the following components:  ? Lymphs Abs 0.6 (*)   ? All other components within normal limits  ?LACTIC ACID, PLASMA  ? ? ?EKG ?EKG Interpretation ? ?Date/Time:  Sunday September 06 2021 16:07:44 EDT ?Ventricular Rate:  108 ?PR Interval:  192 ?QRS Duration: 91 ?QT Interval:  313 ?QTC Calculation: 420 ?R Axis:   -66 ?Text Interpretation: Sinus tachycardia Consider right ventricular hypertrophy Inferior infarct, old Confirmed by Ronnald Nian, Fremon Zacharia [phone]) on 09/06/2021 5:46:03 PM ? ?Radiology ?DG Chest 2 View ? ?Result Date: 09/06/2021 ?CLINICAL DATA:  Cough and weakness EXAM: CHEST - 2 VIEW COMPARISON:  07/10/2010 FINDINGS: Frontal and lateral views of the chest demonstrate postsurgical changes from CABG. Cardiac silhouette is stable. Chronic scarring at the lung bases without airspace disease, effusion, or pneumothorax. No acute bony abnormality. IMPRESSION: 1. Stable exam, no acute process. Electronically Signed   By: Randa Ngo M.D.   On: 09/06/2021 15:58   ? ?Procedures ?Procedures  ? ? ?Medications Ordered in ED ?Medications  ?benzonatate (TESSALON) capsule 200 mg (has no administration in time range)  ?acetaminophen (TYLENOL) tablet 650 mg (650 mg Oral Given 09/06/21 1532)  ? ? ?ED Course/ Medical Decision Making/ A&P ?  ?                        ?Medical Decision Making ?Amount and/or Complexity of Data Reviewed ?Labs: ordered. ?Radiology: ordered. ? ?Risk ?OTC drugs. ?Prescription drug management. ? ? ?ELDRIDGE MARCOTT is here with cough.  Diagnosed with COVID about 9 days ago.   Patient had fever upon arrival but on my evaluation fever has resolved.  Vital signs are normal.  No tachycardia or hypotension.  No concern for sepsis.  COVID test is still positive.  Was given Tylenol in triage.  Basi

## 2021-09-06 NOTE — ED Triage Notes (Signed)
Pt BIB EMS from home for cough and weakness.  Tested on 17th for covid.  Was given a rx for prednisone.  States he has gotten progressively worse, not better since being on the meds.  VSS.  Pt states he is too weak to perform ADLs and today is his "worst yet"  Had appt with MD tomorrow but decided he needed eval sooner.  ?

## 2021-09-14 DIAGNOSIS — E871 Hypo-osmolality and hyponatremia: Secondary | ICD-10-CM | POA: Diagnosis not present

## 2021-09-14 DIAGNOSIS — I701 Atherosclerosis of renal artery: Secondary | ICD-10-CM | POA: Diagnosis not present

## 2021-09-14 DIAGNOSIS — I129 Hypertensive chronic kidney disease with stage 1 through stage 4 chronic kidney disease, or unspecified chronic kidney disease: Secondary | ICD-10-CM | POA: Diagnosis not present

## 2021-09-14 DIAGNOSIS — Z8616 Personal history of COVID-19: Secondary | ICD-10-CM | POA: Diagnosis not present

## 2021-09-14 DIAGNOSIS — N1831 Chronic kidney disease, stage 3a: Secondary | ICD-10-CM | POA: Diagnosis not present

## 2021-09-14 DIAGNOSIS — D631 Anemia in chronic kidney disease: Secondary | ICD-10-CM | POA: Diagnosis not present

## 2021-09-14 DIAGNOSIS — E559 Vitamin D deficiency, unspecified: Secondary | ICD-10-CM | POA: Diagnosis not present

## 2021-09-17 ENCOUNTER — Encounter: Payer: Self-pay | Admitting: Internal Medicine

## 2021-09-17 ENCOUNTER — Ambulatory Visit (INDEPENDENT_AMBULATORY_CARE_PROVIDER_SITE_OTHER): Payer: Medicare Other | Admitting: Internal Medicine

## 2021-09-17 VITALS — BP 126/70 | HR 77 | Resp 18 | Ht 69.0 in | Wt 165.8 lb

## 2021-09-17 DIAGNOSIS — R7989 Other specified abnormal findings of blood chemistry: Secondary | ICD-10-CM | POA: Diagnosis not present

## 2021-09-17 DIAGNOSIS — I6521 Occlusion and stenosis of right carotid artery: Secondary | ICD-10-CM

## 2021-09-17 NOTE — Patient Instructions (Signed)
Come back anytime during the week of April 17th for the blood work for the liver. You do not have to be fasting for this. ?

## 2021-09-17 NOTE — Progress Notes (Signed)
? ?  Subjective:  ? ?Patient ID: [name], male    DOB: [birth year], 81 y.o.   MRN: [id number] ? ?HPI ?The patient is an 81 YO man coming in for several concerns including post-covid-19 symptoms. High liver numbers at nephrology we have those. ? ?Review of Systems  ?Constitutional:  Positive for fatigue.  ?HENT: Negative.    ?Eyes: Negative.   ?Respiratory:  Negative for cough, chest tightness and shortness of breath.   ?Cardiovascular:  Negative for chest pain, palpitations and leg swelling.  ?Gastrointestinal:  Negative for abdominal distention, abdominal pain, constipation, diarrhea, nausea and vomiting.  ?Musculoskeletal: Negative.   ?Skin: Negative.   ?Neurological: Negative.   ?Psychiatric/Behavioral: Negative.    ? ?Objective:  ?Physical Exam ?Constitutional:   ?   Appearance: He is well-developed.  ?HENT:  ?   Head: Normocephalic and atraumatic.  ?Cardiovascular:  ?   Rate and Rhythm: Normal rate and regular rhythm.  ?Pulmonary:  ?   Effort: Pulmonary effort is normal. No respiratory distress.  ?   Breath sounds: Normal breath sounds. No wheezing or rales.  ?Abdominal:  ?   General: Bowel sounds are normal. There is no distension.  ?   Palpations: Abdomen is soft.  ?   Tenderness: There is no abdominal tenderness. There is no rebound.  ?Musculoskeletal:  ?   Cervical back: Normal range of motion.  ?Skin: ?   General: Skin is warm and dry.  ?Neurological:  ?   Mental Status: He is alert and oriented to person, place, and time.  ?   Coordination: Coordination normal.  ? ? ?Vitals:  ? 09/17/21 0830  ?BP: 126/70  ?Pulse: 77  ?Resp: 18  ?SpO2: 97%  ?Weight: 165 lb 12.8 oz (75.2 kg)  ?Height: '5\' 9"'$  (1.753 m)  ? ? ?This visit occurred during the SARS-CoV-2 public health emergency.  Safety protocols were in place, including screening questions prior to the visit, additional usage of staff PPE, and extensive cleaning of exam room while observing appropriate contact time as indicated for disinfecting solutions.   ? ?Assessment & Plan:  ? ?

## 2021-09-17 NOTE — Assessment & Plan Note (Signed)
Had normalized in the past. Suspect covid-19 treatment medication could have caused versus covid-19 infection itself. Was taking more tylenol for fevers/headache during covid-19 as well. He has cut back on tylenol. We will recheck levels in 1-2 weeks orders placed. If persistently abnormal we can consider adjustment of his cholesterol regimen. ?

## 2021-09-28 ENCOUNTER — Other Ambulatory Visit (INDEPENDENT_AMBULATORY_CARE_PROVIDER_SITE_OTHER): Payer: Medicare Other

## 2021-09-28 ENCOUNTER — Other Ambulatory Visit: Payer: Medicare Other

## 2021-09-28 DIAGNOSIS — R7989 Other specified abnormal findings of blood chemistry: Secondary | ICD-10-CM

## 2021-09-28 LAB — COMPREHENSIVE METABOLIC PANEL
ALT: 29 U/L (ref 0–53)
AST: 29 U/L (ref 0–37)
Albumin: 4.2 g/dL (ref 3.5–5.2)
Alkaline Phosphatase: 77 U/L (ref 39–117)
BUN: 15 mg/dL (ref 6–23)
CO2: 30 mEq/L (ref 19–32)
Calcium: 9.4 mg/dL (ref 8.4–10.5)
Chloride: 97 mEq/L (ref 96–112)
Creatinine, Ser: 1.46 mg/dL (ref 0.40–1.50)
GFR: 45.27 mL/min — ABNORMAL LOW (ref >60.00)
Glucose, Bld: 95 mg/dL (ref 70–99)
Potassium: 4.8 mEq/L (ref 3.5–5.1)
Sodium: 134 mEq/L — ABNORMAL LOW (ref 135–145)
Total Bilirubin: 0.4 mg/dL (ref 0.2–1.2)
Total Protein: 7.1 g/dL (ref 6.0–8.3)

## 2021-11-16 DIAGNOSIS — C4A61 Merkel cell carcinoma of right upper limb, including shoulder: Secondary | ICD-10-CM | POA: Diagnosis not present

## 2021-11-16 DIAGNOSIS — Z6824 Body mass index (BMI) 24.0-24.9, adult: Secondary | ICD-10-CM | POA: Diagnosis not present

## 2021-12-07 DIAGNOSIS — D1801 Hemangioma of skin and subcutaneous tissue: Secondary | ICD-10-CM | POA: Diagnosis not present

## 2021-12-07 DIAGNOSIS — Z85828 Personal history of other malignant neoplasm of skin: Secondary | ICD-10-CM | POA: Diagnosis not present

## 2021-12-07 DIAGNOSIS — L821 Other seborrheic keratosis: Secondary | ICD-10-CM | POA: Diagnosis not present

## 2021-12-07 DIAGNOSIS — L820 Inflamed seborrheic keratosis: Secondary | ICD-10-CM | POA: Diagnosis not present

## 2021-12-21 DIAGNOSIS — H6123 Impacted cerumen, bilateral: Secondary | ICD-10-CM | POA: Diagnosis not present

## 2021-12-21 DIAGNOSIS — H903 Sensorineural hearing loss, bilateral: Secondary | ICD-10-CM | POA: Diagnosis not present

## 2021-12-29 ENCOUNTER — Telehealth: Payer: Self-pay | Admitting: Internal Medicine

## 2021-12-29 NOTE — Telephone Encounter (Signed)
N/A unable to leave a  message for patient to call back to schedule Medicare Annual Wellness Visit   Last AWV  12/12/20  Please schedule at anytime with [facility] Health Advisor if patient calls the office back.     Any questions, please call me at [phone] 

## 2022-01-05 NOTE — Telephone Encounter (Signed)
Tried to call pt to sched an AWV. Pt vm is not set up at this time to LVM.

## 2022-02-04 DIAGNOSIS — Z8616 Personal history of COVID-19: Secondary | ICD-10-CM | POA: Diagnosis not present

## 2022-02-04 DIAGNOSIS — N189 Chronic kidney disease, unspecified: Secondary | ICD-10-CM | POA: Diagnosis not present

## 2022-02-04 DIAGNOSIS — I701 Atherosclerosis of renal artery: Secondary | ICD-10-CM | POA: Diagnosis not present

## 2022-02-04 DIAGNOSIS — E871 Hypo-osmolality and hyponatremia: Secondary | ICD-10-CM | POA: Diagnosis not present

## 2022-02-04 DIAGNOSIS — I129 Hypertensive chronic kidney disease with stage 1 through stage 4 chronic kidney disease, or unspecified chronic kidney disease: Secondary | ICD-10-CM | POA: Diagnosis not present

## 2022-02-04 DIAGNOSIS — E559 Vitamin D deficiency, unspecified: Secondary | ICD-10-CM | POA: Diagnosis not present

## 2022-02-04 DIAGNOSIS — D631 Anemia in chronic kidney disease: Secondary | ICD-10-CM | POA: Diagnosis not present

## 2022-02-04 DIAGNOSIS — N1831 Chronic kidney disease, stage 3a: Secondary | ICD-10-CM | POA: Diagnosis not present

## 2022-02-23 ENCOUNTER — Telehealth: Payer: Self-pay | Admitting: Cardiovascular Disease

## 2022-02-23 NOTE — Telephone Encounter (Signed)
Spoke to patient he stated for the past 1 week he has noticed B/P has been elevated.Systolic ranging 747 to 340.Stated his kidney Dr.told him 1 week ago to increase Metoprolol to 25 mg 1/2 tablet in am and 1 tablet in pm.He cannot tell it has helped.He also has been having occasional chest pain for the past 4 months.No chest pain at present.Appointment scheduled with Diona Browner NP 9/15 at 1:55 pm.Advised to bring B/P readings and a list of all medications.

## 2022-02-23 NOTE — Telephone Encounter (Signed)
Pt c/o BP issue: STAT if pt c/o blurred vision, one-sided weakness or slurred speech  1. What are your last 5 BP readings?  Around 140-150/80, no readings available but patient states his systolic is normally around 120  2. Are you having any other symptoms (ex. Dizziness, headache, blurred vision, passed out)?  Tiredness   3. What is your BP issue?   Patient states his BP has increased over the past week.

## 2022-02-26 ENCOUNTER — Ambulatory Visit: Payer: Medicare Other | Attending: Nurse Practitioner | Admitting: Nurse Practitioner

## 2022-02-26 ENCOUNTER — Encounter: Payer: Self-pay | Admitting: Nurse Practitioner

## 2022-02-26 VITALS — BP 128/72 | HR 66 | Ht 69.0 in | Wt 172.0 lb

## 2022-02-26 DIAGNOSIS — I6523 Occlusion and stenosis of bilateral carotid arteries: Secondary | ICD-10-CM | POA: Insufficient documentation

## 2022-02-26 DIAGNOSIS — I25118 Atherosclerotic heart disease of native coronary artery with other forms of angina pectoris: Secondary | ICD-10-CM | POA: Diagnosis not present

## 2022-02-26 DIAGNOSIS — I480 Paroxysmal atrial fibrillation: Secondary | ICD-10-CM | POA: Insufficient documentation

## 2022-02-26 DIAGNOSIS — I739 Peripheral vascular disease, unspecified: Secondary | ICD-10-CM | POA: Diagnosis not present

## 2022-02-26 DIAGNOSIS — E785 Hyperlipidemia, unspecified: Secondary | ICD-10-CM | POA: Insufficient documentation

## 2022-02-26 DIAGNOSIS — I10 Essential (primary) hypertension: Secondary | ICD-10-CM | POA: Diagnosis not present

## 2022-02-26 MED ORDER — NITROGLYCERIN 0.4 MG SL SUBL: 0.4000 mg | SUBLINGUAL_TABLET | SUBLINGUAL | 3 refills | Status: AC | PRN

## 2022-02-26 MED ORDER — METOPROLOL TARTRATE 25 MG PO TABS: ORAL_TABLET | ORAL | 3 refills | Status: DC

## 2022-02-26 MED ORDER — ISOSORBIDE MONONITRATE ER 60 MG PO TB24: 60.0000 mg | ORAL_TABLET | Freq: Every day | ORAL | 3 refills | Status: DC

## 2022-02-26 NOTE — Progress Notes (Signed)
Office Visit    Patient Name: [name] Date of Encounter: 02/26/2022  Primary Care Provider:  [name], MD Primary Cardiologist:  Quay Burow, MD  Chief Complaint    81 year old male with a history of CAD, PVD, postop atrial fibrillation, carotid artery disease, hypertension, hyperlipidemia, BPPV, and GERD who presents for follow-up related to CAD and hypertension.   Past Medical History    Past Medical History:  Diagnosis Date   Anemia, unspecified    Basal cell carcinoma (BCC) of right forearm    [name]   Benign paroxysmal positional vertigo    Blood transfusion without reported diagnosis    CAD (coronary artery disease)    HX CABG 2006 X 4; last nuc 11/2010 last cath 2012-wth 95% OM too small for intervention, treated medically    Carcinoma in situ of skin, site unspecified    Cerebrovascular disease, unspecified    hx CEA-RT; KNOWN AOTOILIAC 7 LT sfa disease   Chronic renal insufficiency    glomerulonephritis 2542706.2   Complication of anesthesia    Congenital anomaly of tongue, unspecified    Esophageal reflux    Essential hypertension, benign    Genital herpes, unspecified    History of stress test 06/2010   Hx of echocardiogram 01/2005   EF 55% normal study   Lung mass    PAF (paroxysmal atrial fibrillation) ([place])    post CABG, maintaining SR   Peripheral vascular disease, unspecified (HCC)    PONV (postoperative nausea and vomiting)    Pure hypercholesterolemia    Past Surgical History:  Procedure Laterality Date   ABDOMINAL AORTOGRAM W/LOWER EXTREMITY N/A 07/10/2020   Procedure: ABDOMINAL AORTOGRAM W/LOWER EXTREMITY;  Surgeon: [name], MD;  Location: [facility];  Service: Cardiovascular;  Laterality: N/A;   CARDIAC CATHETERIZATION  06/2010   CAROTID ENDARTERECTOMY  2007   Right   CHOLECYSTECTOMY  07/2010   CORONARY ARTERY BYPASS GRAFT  05/12/2005   LIMA-LAD, VG-DIAG; VG-LCX,OM; VG-RCA   CORONARY STENT  INTERVENTION N/A 07/24/2018   Procedure: CORONARY STENT INTERVENTION;  Surgeon: [name], MD;  Location: [facility];  Service: Cardiovascular;  Laterality: N/A;   IR RADIOLOGIST EVAL & MGMT  07/13/2017   IR RADIOLOGIST EVAL & MGMT  09/20/2017   IR RENAL BILAT S&I MOD SED  08/09/2017   IR TRANSCATH PLC STENT 1ST ART NOT LE CV CAR VERT CAR  08/09/2017   IR US GUIDE VASC ACCESS RIGHT  08/09/2017   LEFT HEART CATH AND CORS/GRAFTS ANGIOGRAPHY N/A 07/24/2018   Procedure: LEFT HEART CATH AND CORS/GRAFTS ANGIOGRAPHY;  Surgeon: [name], MD;  Location: [facility];  Service: Cardiovascular;  Laterality: N/A;   LEFT HEART CATH AND CORS/GRAFTS ANGIOGRAPHY N/A 05/28/2019   Procedure: LEFT HEART CATH AND CORS/GRAFTS ANGIOGRAPHY;  Surgeon: [name], MD;  Location: [facility];  Service: Cardiovascular;  Laterality: N/A;   LEFT HEART CATH AND CORS/GRAFTS ANGIOGRAPHY N/A 07/10/2020   Procedure: LEFT HEART CATH AND CORS/GRAFTS ANGIOGRAPHY;  Surgeon: [name], MD;  Location: [facility];  Service: Cardiovascular;  Laterality: N/A;    Allergies  Allergies  Allergen Reactions   Adhesive [Tape] Rash   Latex Rash    History of Present Illness    81 year old male with the above past medical history including CAD, PVD, postop atrial fibrillation, carotid artery disease, hypertension, hyperlipidemia, BPPV, and GERD .  He has a history of CAD s/p CABG  x4 in 2006 (LIMA-LAD, SVG-RCA, SVG-LCx, SVG-Diagonal).  He did develop postop atrial fibrillation.  He has a history of PVD and carotid artery stenosis s/p right carotid endarterectomy.  He has aortoiliac and left SFA disease but has remained asymptomatic.  Cardiac catheterization in January 2012 showed patent grafts, moderate aortoiliac disease.  He has a history of moderate renal insufficiency and follows with nephrology.  He underwent left renal artery stent placement in 2019.  He has a history of symptomatic  bradycardia on beta-blocker therapy.  Event monitor showed no evidence of arrhythmia.  Myoview in 2020 showed ischemia in the diagonal branch territory.  Outpatient cardiac catheterization in February 2020 revealed 99% stenosis in the body of the vein graft to diagonal branch which was treated with DES x2 overlapping.  His vein graft to his RCA was occluded but native RCA was widely patent.  LIMA-LAD was patent, SVG-OM had a 70% smooth stenosis which did not appear to be physiologically significant.  He noted increased exertional dyspnea and underwent repeat cardiac catheterization in December 2020 which revealed occluded diagonal branch vein graft, otherwise unchanged anatomy.  Medical management was advised.  Repeat cardiac catheterization in January 2022 revealed unchanged anatomy compared to prior cath.  Abdominal aortogram and bilateral iliac aortogram at the time showed small distal abdominal aorta, no significant iliac disease.  Carotid Dopplers in 05/2021 showed chronic percent BICA stenosis s/p right carotid endarterectomy.  Repeat PV studies were recommended in 1 year.  He was last seen in the office on 07/14/2021 and was stable from a cardiac standpoint.  He noted stable infrequent exertional chest pain.  He contacted our office on 02/23/2022 with concern for elevated BP, intermittent chest discomfort.  He presents today for follow-up.  Since his last visit he has been stable from a cardiac standpoint.  He notes that his nephrologist recommended he increase his metoprolol to a total of 37.5 mg daily (12.5 mg in the a.m. and 25 mg in the p.m.).  Since this time, he has noticed an improvement in his BP.  He is walking 1 mile a day.  He notes occasional chest tightness, however, his symptoms are stable over the past year.  He denies claudication, dyspnea, PND, orthopnea, weight gain.  He is pleased that his blood pressure has improved.  Overall, he reports feeling well and denies any additional concerns  today.  Home Medications    Current Outpatient Medications  Medication Sig Dispense Refill   aspirin EC 81 MG tablet Take 81 mg by mouth daily at 12 noon.      Choline Fenofibrate (FENOFIBRIC ACID) 45 MG CPDR Take 1 capsule by mouth at bedtime. 90 capsule 3   clopidogrel (PLAVIX) 75 MG tablet Take 1 tablet (75 mg total) by mouth daily with breakfast. 90 tablet 4   docusate sodium (COLACE) 100 MG capsule Take 100 mg by mouth 2 (two) times daily as needed (constipation.).     ezetimibe (ZETIA) 10 MG tablet Take 1 tablet (10 mg total) by mouth daily. 90 tablet 2   furosemide (LASIX) 20 MG tablet Take 1 tablet by mouth daily.     hydrocortisone 2.5 % cream Place rectally as needed.     simvastatin (ZOCOR) 80 MG tablet TAKE ONE TABLET BY MOUTH DAILY AT 6 PM 90 tablet 3   tetrahydrozoline 0.05 % ophthalmic solution Place 1-2 drops into both eyes 3 (three) times daily as needed (dry/irritated eyes).     acyclovir (ZOVIRAX) 400 MG tablet Take 1 tablet (  400 mg total) by mouth as needed (cold sores/fever blisters.). (Patient not taking: Reported on 02/26/2022) 90 tablet 3   dimenhyDRINATE (DRAMAMINE) 50 MG tablet Take 50 mg by mouth every 8 (eight) hours as needed (vertigo). (Patient not taking: Reported on 02/26/2022)     furosemide (LASIX) 20 MG tablet Take 10 mg by mouth daily. (Patient not taking: Reported on 02/26/2022)     isosorbide mononitrate (IMDUR) 60 MG 24 hr tablet Take 1 tablet (60 mg total) by mouth daily. 90 tablet 3   meclizine (ANTIVERT) 25 MG tablet Take 1 tablet (25 mg total) by mouth 3 (three) times daily as needed for dizziness. (Patient not taking: Reported on 02/26/2022) 30 tablet 0   metoprolol tartrate (LOPRESSOR) 25 MG tablet Take 1/2 tablet in am and 1 tablet in pm 180 tablet 3   nitroGLYCERIN (NITROSTAT) 0.4 MG SL tablet Place 1 tablet (0.4 mg total) under the tongue as needed for chest pain. 25 tablet 3   pantoprazole (PROTONIX) 40 MG tablet TAKE ONE TABLET BY MOUTH DAILY  (Patient not taking: Reported on 02/26/2022) 90 tablet 1   No current facility-administered medications for this visit.     Review of Systems    He denies chest pain, palpitations, dyspnea, pnd, orthopnea, n, v, dizziness, syncope, edema, weight gain, or early satiety. All other systems reviewed and are otherwise negative except as noted above.   Physical Exam    VS:  BP 128/72   Pulse 66   Ht '5\' 9"'$  (1.753 m)   Wt 172 lb (78 kg)   SpO2 96%   BMI 25.40 kg/m  GEN: Well nourished, well developed, in no acute distress. HEENT: normal. Neck: Supple, no JVD, carotid bruits, or masses. Cardiac: RRR, no murmurs, rubs, or gallops. No clubbing, cyanosis, edema.  Radials/DP/PT 2+ and equal bilaterally.  Respiratory:  Respirations regular and unlabored, clear to auscultation bilaterally. GI: Soft, nontender, nondistended, BS + x 4. MS: no deformity or atrophy. Skin: warm and dry, no rash. Neuro:  Strength and sensation are intact. Psych: Normal affect.  Accessory Clinical Findings    ECG personally reviewed by me today - No EKG in office today.     Lab Results  Component Value Date   WBC 6.6 09/06/2021   HGB 16.4 09/06/2021   HCT 48.4 09/06/2021   MCV 85.2 09/06/2021   PLT 174 09/06/2021   Lab Results  Component Value Date   CREATININE 1.46 09/28/2021   BUN 15 09/28/2021   NA 134 (L) 09/28/2021   K 4.8 09/28/2021   CL 97 09/28/2021   CO2 30 09/28/2021   Lab Results  Component Value Date   ALT 29 09/28/2021   AST 29 09/28/2021   ALKPHOS 77 09/28/2021   BILITOT 0.4 09/28/2021   Lab Results  Component Value Date   CHOL 133 07/14/2021   HDL 37 (L) 07/14/2021   LDLCALC 67 07/14/2021   LDLDIRECT 71 06/06/2019   TRIG 169 (H) 07/14/2021   CHOLHDL 3.6 07/14/2021    No results found for: "HGBA1C"  Assessment & Plan    1. CAD: S/p CABG x4 in 2006. S/p DES x2 overlapping-SVG-diagonal in 2020. Repeat cardiac catheterization in January 2022 revealed unchanged anatomy  compared to prior cath.  He notes occasional chest tightness with exertion, however, his symptoms have been stable since his last cath in 2022.  He is still able to walk 1 mile every day.  I do not think ischemic evaluation is warranted at this time.  Continue to monitor for progression of symptoms.  Continue aspirin, Plavix, metoprolol, isosorbide, simvastatin, and Zetia.   2. PVD:  Abdominal aortogram and bilateral iliac aortogram in 06/2020 showed small distal abdominal aorta, no significant iliac disease.  He denies claudication.  He is due for repeat lower extremity duplex arterial in 05/2022-06/2022. This has already been ordered by Dr. Gwenlyn Found.   3. Carotid artery stenosis: Carotid Dopplers in 05/2021 showed chronic percent B ICA stenosis s/p right carotid endarterectomy.  Repeat carotid Doppler scheduled for 05/2022-06/2022.   4. PAF: No documented recurrence. Not on anticoagulation. Denies any recent palpitations.   5. Hypertension: BP well controlled (improved with increased metoprolol). Continue current antihypertensive regimen.   6. Hyperlipidemia: LDL was 67 in 06/2021.  Continue simvastatin, Zetia.  7. Disposition: Follow-up in 4 months with Dr. Gwenlyn Found.      Lenna Sciara, NP 02/26/2022, 4:35 PM

## 2022-02-26 NOTE — Patient Instructions (Addendum)
Medication Instructions:  Your physician recommends that you continue on your current medications as directed. Please refer to the Current Medication list given to you today.   *If you need a refill on your cardiac medications before your next appointment, please call your pharmacy*   Lab Work: NONE ordered at this time of appointment   If you have labs (blood work) drawn today and your tests are completely normal, you will receive your results only by: [place] (if you have MyChart) OR A paper copy in the mail If you have any lab test that is abnormal or we need to change your treatment, we will call you to review the results.   Testing/Procedures: NONE ordered at this time of appointment     Follow-Up: At [facility], you and your health needs are our priority.  As part of our continuing mission to provide you with exceptional heart care, we have created designated Provider Care Teams.  These Care Teams include your primary Cardiologist (physician) and Advanced Practice Providers (APPs -  Physician Assistants and Nurse Practitioners) who all work together to provide you with the care you need, when you need it.  We recommend signing up for the patient portal called "MyChart".  Sign up information is provided on this After Visit Summary.  MyChart is used to connect with patients for Virtual Visits (Telemedicine).  Patients are able to view lab/test results, encounter notes, upcoming appointments, etc.  Non-urgent messages can be sent to your provider as well.   To learn more about what you can do with MyChart, go to [email or web address].    Your next appointment:   4 month(s)  The format for your next appointment:   In Person  Provider:   Quay Burow, MD     Other Instructions   Important Information About Sugar

## 2022-03-03 DIAGNOSIS — S99922A Unspecified injury of left foot, initial encounter: Secondary | ICD-10-CM | POA: Diagnosis not present

## 2022-03-16 ENCOUNTER — Telehealth: Payer: Self-pay | Admitting: Cardiovascular Disease

## 2022-03-16 MED ORDER — METOPROLOL TARTRATE 25 MG PO TABS: ORAL_TABLET | ORAL | 3 refills | Status: AC

## 2022-03-16 NOTE — Telephone Encounter (Signed)
Pt c/o medication issue:  1. Name of Medication:  metoprolol tartrate (LOPRESSOR) 25 MG tablet    2. How are you currently taking this medication (dosage and times per day)? Take 1/2 tablet in am and 1 tablet in pm 3. Are you having a reaction (difficulty breathing--STAT)? No  4. What is your medication issue? Pt states that at last visit medication was increased. He states that it did work for a few days but BP seems to be running a little high again ( 159/80, 154/80). Pt would like a callback regarding this matter.

## 2022-03-16 NOTE — Telephone Encounter (Signed)
Spoke to patient he stated for the past 5 days his B/P has been elevated ranging 159/80,154/80 pulse in 60's.Stated he is taking Metoprolol tart.25 mg 1/2 in am 1 in pm.He wants to know if he can increase to 25 mg 1 twice a day.I will send message to [facility] for advice.

## 2022-03-16 NOTE — Telephone Encounter (Signed)
Spoke to patient Dr.Berry advised ok to increase Metoprolol tart 25 mg twice a day.New prescription sent to pharmacy.Advised to continue to monitor B/P and call back if B/P elevated.

## 2022-04-27 ENCOUNTER — Telehealth: Payer: Self-pay | Admitting: Internal Medicine

## 2022-04-27 NOTE — Telephone Encounter (Signed)
N/A unable to leave a  message for patient to call back to schedule Medicare Annual Wellness Visit   Last AWV  12/12/20  Please schedule at anytime with [facility] if patient calls the office back.     Any questions, please call me at [phone]

## 2022-06-02 DIAGNOSIS — Z20822 Contact with and (suspected) exposure to covid-19: Secondary | ICD-10-CM | POA: Diagnosis not present

## 2022-06-02 DIAGNOSIS — R059 Cough, unspecified: Secondary | ICD-10-CM | POA: Diagnosis not present

## 2022-06-02 DIAGNOSIS — J00 Acute nasopharyngitis [common cold]: Secondary | ICD-10-CM | POA: Diagnosis not present

## 2022-06-15 DIAGNOSIS — D485 Neoplasm of uncertain behavior of skin: Secondary | ICD-10-CM | POA: Diagnosis not present

## 2022-06-15 DIAGNOSIS — L814 Other melanin hyperpigmentation: Secondary | ICD-10-CM | POA: Diagnosis not present

## 2022-06-15 DIAGNOSIS — Z85828 Personal history of other malignant neoplasm of skin: Secondary | ICD-10-CM | POA: Diagnosis not present

## 2022-06-15 DIAGNOSIS — Z85821 Personal history of Merkel cell carcinoma: Secondary | ICD-10-CM | POA: Diagnosis not present

## 2022-06-15 DIAGNOSIS — Z08 Encounter for follow-up examination after completed treatment for malignant neoplasm: Secondary | ICD-10-CM | POA: Diagnosis not present

## 2022-06-15 DIAGNOSIS — L821 Other seborrheic keratosis: Secondary | ICD-10-CM | POA: Diagnosis not present

## 2022-06-15 DIAGNOSIS — L820 Inflamed seborrheic keratosis: Secondary | ICD-10-CM | POA: Diagnosis not present

## 2022-06-15 DIAGNOSIS — D044 Carcinoma in situ of skin of scalp and neck: Secondary | ICD-10-CM | POA: Diagnosis not present

## 2022-06-15 DIAGNOSIS — L570 Actinic keratosis: Secondary | ICD-10-CM | POA: Diagnosis not present

## 2022-06-15 DIAGNOSIS — D225 Melanocytic nevi of trunk: Secondary | ICD-10-CM | POA: Diagnosis not present

## 2022-06-25 DIAGNOSIS — I10 Essential (primary) hypertension: Secondary | ICD-10-CM | POA: Diagnosis not present

## 2022-06-25 DIAGNOSIS — Z1211 Encounter for screening for malignant neoplasm of colon: Secondary | ICD-10-CM | POA: Diagnosis not present

## 2022-06-25 DIAGNOSIS — N183 Chronic kidney disease, stage 3 unspecified: Secondary | ICD-10-CM | POA: Diagnosis not present

## 2022-06-25 DIAGNOSIS — I2510 Atherosclerotic heart disease of native coronary artery without angina pectoris: Secondary | ICD-10-CM | POA: Diagnosis not present

## 2022-06-25 DIAGNOSIS — H6123 Impacted cerumen, bilateral: Secondary | ICD-10-CM | POA: Diagnosis not present

## 2022-06-25 DIAGNOSIS — Z0000 Encounter for general adult medical examination without abnormal findings: Secondary | ICD-10-CM | POA: Diagnosis not present

## 2022-06-25 DIAGNOSIS — E785 Hyperlipidemia, unspecified: Secondary | ICD-10-CM | POA: Diagnosis not present

## 2022-06-25 DIAGNOSIS — R6889 Other general symptoms and signs: Secondary | ICD-10-CM | POA: Diagnosis not present

## 2022-06-29 ENCOUNTER — Ambulatory Visit: Payer: Medicare Other | Admitting: Cardiovascular Disease

## 2022-07-13 DIAGNOSIS — D044 Carcinoma in situ of skin of scalp and neck: Secondary | ICD-10-CM | POA: Diagnosis not present

## 2022-07-19 DIAGNOSIS — R1030 Lower abdominal pain, unspecified: Secondary | ICD-10-CM | POA: Diagnosis not present

## 2022-08-09 ENCOUNTER — Telehealth: Payer: Self-pay

## 2022-08-09 NOTE — Telephone Encounter (Signed)
Called patient to schedule Medicare Annual Wellness Visit (AWV). No voicemail available to leave a message.  Last date of AWV: 12/12/20  Please schedule an appointment at any time with [facility].  Norton Blizzard, [place] (AAMA)  [facility] [phone]

## 2022-08-12 DIAGNOSIS — H6123 Impacted cerumen, bilateral: Secondary | ICD-10-CM | POA: Diagnosis not present

## 2022-08-13 DIAGNOSIS — I480 Paroxysmal atrial fibrillation: Secondary | ICD-10-CM | POA: Diagnosis not present

## 2022-08-13 DIAGNOSIS — I6523 Occlusion and stenosis of bilateral carotid arteries: Secondary | ICD-10-CM | POA: Diagnosis not present

## 2022-08-13 DIAGNOSIS — I739 Peripheral vascular disease, unspecified: Secondary | ICD-10-CM | POA: Diagnosis not present

## 2022-08-13 DIAGNOSIS — I2510 Atherosclerotic heart disease of native coronary artery without angina pectoris: Secondary | ICD-10-CM | POA: Diagnosis not present

## 2022-08-13 DIAGNOSIS — R072 Precordial pain: Secondary | ICD-10-CM | POA: Diagnosis not present

## 2022-08-13 DIAGNOSIS — I10 Essential (primary) hypertension: Secondary | ICD-10-CM | POA: Diagnosis not present

## 2022-08-13 DIAGNOSIS — E782 Mixed hyperlipidemia: Secondary | ICD-10-CM | POA: Diagnosis not present

## 2022-08-27 DIAGNOSIS — I071 Rheumatic tricuspid insufficiency: Secondary | ICD-10-CM | POA: Diagnosis not present

## 2022-08-27 DIAGNOSIS — I2510 Atherosclerotic heart disease of native coronary artery without angina pectoris: Secondary | ICD-10-CM | POA: Diagnosis not present

## 2022-09-02 DIAGNOSIS — H905 Unspecified sensorineural hearing loss: Secondary | ICD-10-CM | POA: Diagnosis not present

## 2022-09-09 DIAGNOSIS — I6523 Occlusion and stenosis of bilateral carotid arteries: Secondary | ICD-10-CM | POA: Diagnosis not present

## 2022-09-27 DIAGNOSIS — Z6824 Body mass index (BMI) 24.0-24.9, adult: Secondary | ICD-10-CM | POA: Diagnosis not present

## 2022-09-27 DIAGNOSIS — E559 Vitamin D deficiency, unspecified: Secondary | ICD-10-CM | POA: Diagnosis not present

## 2022-09-27 DIAGNOSIS — K921 Melena: Secondary | ICD-10-CM | POA: Diagnosis not present

## 2022-09-27 DIAGNOSIS — K6289 Other specified diseases of anus and rectum: Secondary | ICD-10-CM | POA: Diagnosis not present

## 2022-09-27 DIAGNOSIS — Z1211 Encounter for screening for malignant neoplasm of colon: Secondary | ICD-10-CM | POA: Diagnosis not present

## 2022-09-27 DIAGNOSIS — N183 Chronic kidney disease, stage 3 unspecified: Secondary | ICD-10-CM | POA: Diagnosis not present

## 2022-10-03 DIAGNOSIS — S81812A Laceration without foreign body, left lower leg, initial encounter: Secondary | ICD-10-CM | POA: Diagnosis not present

## 2022-10-03 DIAGNOSIS — R238 Other skin changes: Secondary | ICD-10-CM | POA: Diagnosis not present

## 2022-10-07 DIAGNOSIS — N1832 Chronic kidney disease, stage 3b: Secondary | ICD-10-CM | POA: Diagnosis not present

## 2022-10-07 DIAGNOSIS — I25718 Atherosclerosis of autologous vein coronary artery bypass graft(s) with other forms of angina pectoris: Secondary | ICD-10-CM | POA: Diagnosis not present

## 2022-10-07 DIAGNOSIS — I10 Essential (primary) hypertension: Secondary | ICD-10-CM | POA: Diagnosis not present

## 2022-10-14 DIAGNOSIS — Z85821 Personal history of Merkel cell carcinoma: Secondary | ICD-10-CM | POA: Diagnosis not present

## 2022-10-14 DIAGNOSIS — Z08 Encounter for follow-up examination after completed treatment for malignant neoplasm: Secondary | ICD-10-CM | POA: Diagnosis not present

## 2022-10-14 DIAGNOSIS — D485 Neoplasm of uncertain behavior of skin: Secondary | ICD-10-CM | POA: Diagnosis not present

## 2022-10-14 DIAGNOSIS — L905 Scar conditions and fibrosis of skin: Secondary | ICD-10-CM | POA: Diagnosis not present

## 2022-10-14 DIAGNOSIS — L570 Actinic keratosis: Secondary | ICD-10-CM | POA: Diagnosis not present

## 2022-10-14 DIAGNOSIS — L821 Other seborrheic keratosis: Secondary | ICD-10-CM | POA: Diagnosis not present

## 2022-10-14 DIAGNOSIS — L814 Other melanin hyperpigmentation: Secondary | ICD-10-CM | POA: Diagnosis not present

## 2022-10-14 DIAGNOSIS — Z85828 Personal history of other malignant neoplasm of skin: Secondary | ICD-10-CM | POA: Diagnosis not present

## 2022-10-14 DIAGNOSIS — L578 Other skin changes due to chronic exposure to nonionizing radiation: Secondary | ICD-10-CM | POA: Diagnosis not present

## 2022-11-06 DIAGNOSIS — B349 Viral infection, unspecified: Secondary | ICD-10-CM | POA: Diagnosis not present

## 2022-11-06 DIAGNOSIS — R059 Cough, unspecified: Secondary | ICD-10-CM | POA: Diagnosis not present

## 2022-11-12 DIAGNOSIS — H04123 Dry eye syndrome of bilateral lacrimal glands: Secondary | ICD-10-CM | POA: Diagnosis not present

## 2022-11-12 DIAGNOSIS — H2513 Age-related nuclear cataract, bilateral: Secondary | ICD-10-CM | POA: Diagnosis not present

## 2022-12-22 DIAGNOSIS — D374 Neoplasm of uncertain behavior of colon: Secondary | ICD-10-CM | POA: Diagnosis not present

## 2022-12-22 DIAGNOSIS — K921 Melena: Secondary | ICD-10-CM | POA: Diagnosis not present

## 2022-12-22 DIAGNOSIS — K5669 Other partial intestinal obstruction: Secondary | ICD-10-CM | POA: Diagnosis not present

## 2022-12-22 DIAGNOSIS — R195 Other fecal abnormalities: Secondary | ICD-10-CM | POA: Diagnosis not present

## 2022-12-22 DIAGNOSIS — Z538 Procedure and treatment not carried out for other reasons: Secondary | ICD-10-CM | POA: Diagnosis not present

## 2022-12-24 DIAGNOSIS — C189 Malignant neoplasm of colon, unspecified: Secondary | ICD-10-CM | POA: Diagnosis not present

## 2022-12-24 DIAGNOSIS — R911 Solitary pulmonary nodule: Secondary | ICD-10-CM | POA: Diagnosis not present

## 2022-12-24 DIAGNOSIS — N281 Cyst of kidney, acquired: Secondary | ICD-10-CM | POA: Diagnosis not present

## 2023-02-01 IMAGING — CR DG CHEST 2V
2 series · 2 of 2 positions shown · non-contrast
Comparison: 07/10/2010

CLINICAL DATA: Cough and weakness

EXAM:
CHEST - 2 VIEW

[w chest pa]
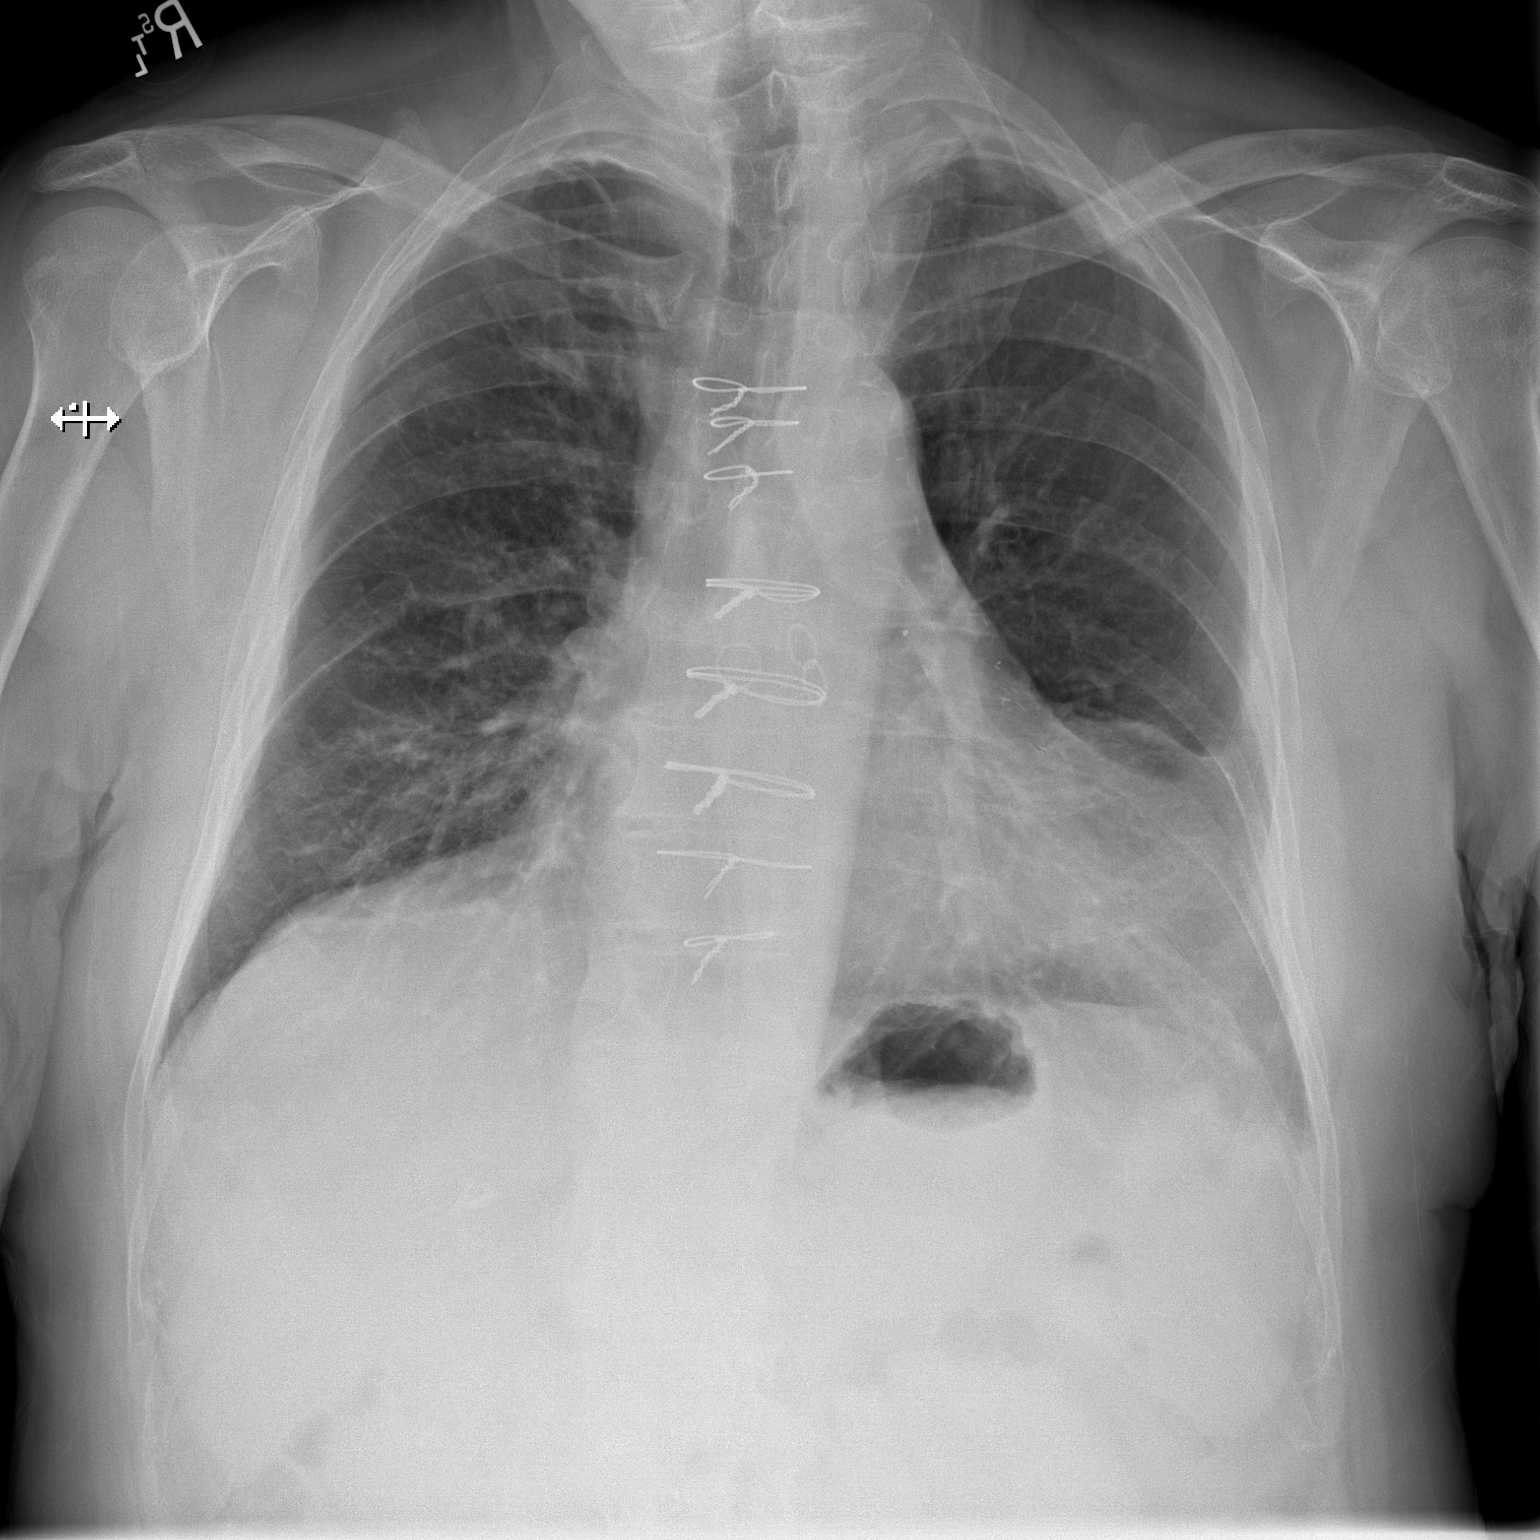

[w chest lat]
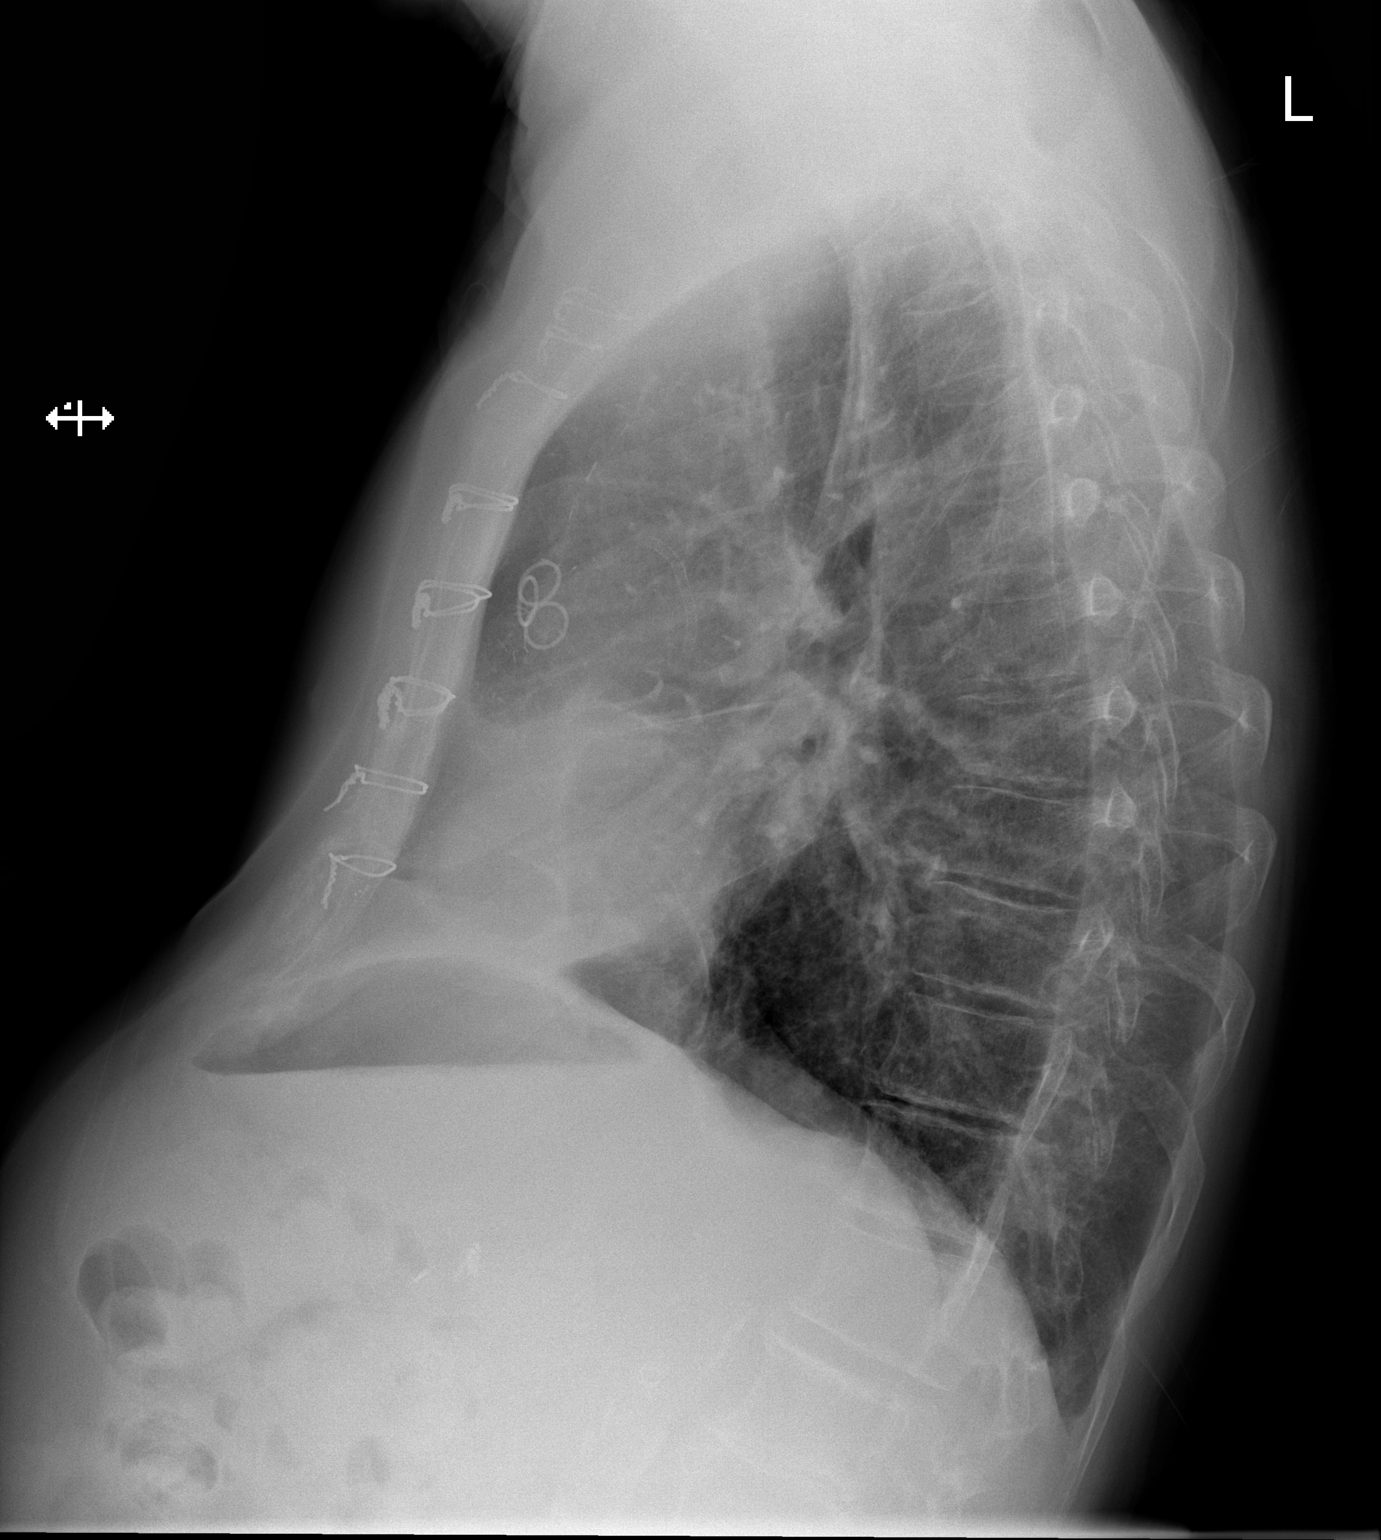

[2 of 2 positions shown; findings below may reference images not displayed]

FINDINGS: Frontal and lateral views of the chest demonstrate postsurgical
changes from CABG. Cardiac silhouette is stable. Chronic scarring at
the lung bases without airspace disease, effusion, or pneumothorax.
No acute bony abnormality.
IMPRESSION: 1. Stable exam, no acute process.

## 2023-02-25 ENCOUNTER — Other Ambulatory Visit: Payer: Self-pay | Admitting: Nurse Practitioner

## 2023-02-25 DIAGNOSIS — I10 Essential (primary) hypertension: Secondary | ICD-10-CM

## 2023-02-25 DIAGNOSIS — I25118 Atherosclerotic heart disease of native coronary artery with other forms of angina pectoris: Secondary | ICD-10-CM
